# Patient Record
Sex: Female | Born: 1965 | Race: Black or African American | Hispanic: No | Marital: Married | Smoking: Never smoker
Health system: Southern US, Community
[De-identification: ages and names within clinical notes are randomized; demographics above are authoritative.]

## PROBLEM LIST (undated history)

## (undated) DIAGNOSIS — I10 Essential (primary) hypertension: Secondary | ICD-10-CM

## (undated) DIAGNOSIS — C801 Malignant (primary) neoplasm, unspecified: Secondary | ICD-10-CM

## (undated) DIAGNOSIS — I8500 Esophageal varices without bleeding: Secondary | ICD-10-CM

## (undated) DIAGNOSIS — IMO0002 Reserved for concepts with insufficient information to code with codable children: Secondary | ICD-10-CM

## (undated) DIAGNOSIS — Z809 Family history of malignant neoplasm, unspecified: Secondary | ICD-10-CM

## (undated) DIAGNOSIS — I2699 Other pulmonary embolism without acute cor pulmonale: Secondary | ICD-10-CM

## (undated) DIAGNOSIS — K219 Gastro-esophageal reflux disease without esophagitis: Secondary | ICD-10-CM

## (undated) DIAGNOSIS — D649 Anemia, unspecified: Secondary | ICD-10-CM

## (undated) DIAGNOSIS — D219 Benign neoplasm of connective and other soft tissue, unspecified: Secondary | ICD-10-CM

## (undated) DIAGNOSIS — A630 Anogenital (venereal) warts: Secondary | ICD-10-CM

## (undated) HISTORY — DX: Anemia, unspecified: D64.9

## (undated) HISTORY — PX: OTHER SURGICAL HISTORY: SHX169

## (undated) HISTORY — DX: Anogenital (venereal) warts: A63.0

## (undated) HISTORY — DX: Benign neoplasm of connective and other soft tissue, unspecified: D21.9

## (undated) HISTORY — DX: Reserved for concepts with insufficient information to code with codable children: IMO0002

## (undated) HISTORY — DX: Family history of malignant neoplasm, unspecified: Z80.9

---

## 1996-05-17 HISTORY — PX: LEFT OOPHORECTOMY: SHX1961

## 2001-05-17 HISTORY — PX: MYOMECTOMY: SHX85

## 2004-05-17 DIAGNOSIS — D649 Anemia, unspecified: Secondary | ICD-10-CM

## 2004-05-17 HISTORY — DX: Anemia, unspecified: D64.9

## 2005-05-06 ENCOUNTER — Other Ambulatory Visit: Admission: RE | Admit: 2005-05-06 | Discharge: 2005-05-06 | Payer: Self-pay | Admitting: Obstetrics and Gynecology

## 2005-11-19 ENCOUNTER — Inpatient Hospital Stay (HOSPITAL_COMMUNITY): Admission: EM | Admit: 2005-11-19 | Discharge: 2005-11-19 | Payer: Self-pay | Admitting: Emergency Medicine

## 2005-11-19 ENCOUNTER — Encounter (INDEPENDENT_AMBULATORY_CARE_PROVIDER_SITE_OTHER): Payer: Self-pay | Admitting: *Deleted

## 2005-11-24 ENCOUNTER — Emergency Department (HOSPITAL_COMMUNITY): Admission: EM | Admit: 2005-11-24 | Discharge: 2005-11-24 | Payer: Self-pay | Admitting: Emergency Medicine

## 2006-05-17 DIAGNOSIS — I2699 Other pulmonary embolism without acute cor pulmonale: Secondary | ICD-10-CM

## 2006-05-17 HISTORY — DX: Other pulmonary embolism without acute cor pulmonale: I26.99

## 2006-05-17 HISTORY — PX: MYOMECTOMY: SHX85

## 2006-07-26 ENCOUNTER — Encounter: Admission: RE | Admit: 2006-07-26 | Discharge: 2006-07-26 | Payer: Self-pay | Admitting: Internal Medicine

## 2006-10-07 ENCOUNTER — Ambulatory Visit: Payer: Self-pay | Admitting: Oncology

## 2008-12-12 ENCOUNTER — Ambulatory Visit: Payer: Self-pay | Admitting: Internal Medicine

## 2008-12-12 ENCOUNTER — Ambulatory Visit: Payer: Self-pay | Admitting: Radiology

## 2008-12-12 ENCOUNTER — Inpatient Hospital Stay (HOSPITAL_COMMUNITY): Admission: EM | Admit: 2008-12-12 | Discharge: 2008-12-14 | Payer: Self-pay | Admitting: Internal Medicine

## 2008-12-12 ENCOUNTER — Encounter: Payer: Self-pay | Admitting: Emergency Medicine

## 2008-12-13 ENCOUNTER — Encounter (INDEPENDENT_AMBULATORY_CARE_PROVIDER_SITE_OTHER): Payer: Self-pay | Admitting: Internal Medicine

## 2008-12-14 ENCOUNTER — Inpatient Hospital Stay (HOSPITAL_COMMUNITY): Admission: EM | Admit: 2008-12-14 | Discharge: 2008-12-16 | Payer: Self-pay | Admitting: Emergency Medicine

## 2008-12-16 ENCOUNTER — Ambulatory Visit: Payer: Self-pay | Admitting: Vascular Surgery

## 2008-12-16 ENCOUNTER — Encounter (INDEPENDENT_AMBULATORY_CARE_PROVIDER_SITE_OTHER): Payer: Self-pay | Admitting: Internal Medicine

## 2008-12-25 ENCOUNTER — Ambulatory Visit: Payer: Self-pay

## 2009-01-02 ENCOUNTER — Ambulatory Visit (HOSPITAL_BASED_OUTPATIENT_CLINIC_OR_DEPARTMENT_OTHER): Admission: RE | Admit: 2009-01-02 | Discharge: 2009-01-02 | Payer: Self-pay | Admitting: Internal Medicine

## 2009-01-11 ENCOUNTER — Ambulatory Visit: Payer: Self-pay | Admitting: Internal Medicine

## 2009-01-30 DIAGNOSIS — L708 Other acne: Secondary | ICD-10-CM | POA: Insufficient documentation

## 2009-01-30 DIAGNOSIS — G4710 Hypersomnia, unspecified: Secondary | ICD-10-CM | POA: Insufficient documentation

## 2009-01-30 DIAGNOSIS — Z8679 Personal history of other diseases of the circulatory system: Secondary | ICD-10-CM | POA: Insufficient documentation

## 2009-01-30 DIAGNOSIS — I10 Essential (primary) hypertension: Secondary | ICD-10-CM | POA: Insufficient documentation

## 2009-01-31 ENCOUNTER — Ambulatory Visit: Payer: Self-pay | Admitting: Cardiology

## 2010-06-22 ENCOUNTER — Emergency Department (HOSPITAL_COMMUNITY)
Admission: EM | Admit: 2010-06-22 | Discharge: 2010-06-22 | Disposition: A | Discharge status: Home / Self Care | Payer: BC Managed Care – PPO | Attending: Emergency Medicine | Admitting: Emergency Medicine

## 2010-06-22 ENCOUNTER — Emergency Department (HOSPITAL_COMMUNITY): Payer: BC Managed Care – PPO

## 2010-06-22 DIAGNOSIS — I10 Essential (primary) hypertension: Secondary | ICD-10-CM | POA: Insufficient documentation

## 2010-06-22 DIAGNOSIS — R0602 Shortness of breath: Secondary | ICD-10-CM | POA: Insufficient documentation

## 2010-06-22 DIAGNOSIS — R079 Chest pain, unspecified: Secondary | ICD-10-CM | POA: Insufficient documentation

## 2010-06-22 LAB — URINALYSIS, ROUTINE W REFLEX MICROSCOPIC
Hgb urine dipstick: NEGATIVE
Specific Gravity, Urine: 1.021 (ref 1.005–1.030)
Urobilinogen, UA: 0.2 mg/dL (ref 0.0–1.0)

## 2010-06-22 LAB — BASIC METABOLIC PANEL
CO2: 25 mEq/L (ref 19–32)
Calcium: 9.2 mg/dL (ref 8.4–10.5)
Creatinine, Ser: 0.67 mg/dL (ref 0.4–1.2)
GFR calc non Af Amer: >60 mL/min (ref >60)

## 2010-06-22 MED ORDER — IOHEXOL 300 MG/ML  SOLN
100.0000 mL | Freq: Once | INTRAMUSCULAR | Status: AC | PRN
  Administered 2010-06-22: 100 mL via INTRAVENOUS

## 2010-08-22 LAB — BASIC METABOLIC PANEL
Calcium: 8.8 mg/dL (ref 8.4–10.5)
Chloride: 104 mEq/L (ref 96–112)
Creatinine, Ser: 0.58 mg/dL (ref 0.4–1.2)
Glucose, Bld: 92 mg/dL (ref 70–99)
Potassium: 3.3 mEq/L — ABNORMAL LOW (ref 3.5–5.1)

## 2010-08-22 LAB — CBC
HCT: 31.1 % — ABNORMAL LOW (ref 36.0–46.0)
Hemoglobin: 10.3 g/dL — ABNORMAL LOW (ref 12.0–15.0)
MCHC: 33.1 g/dL (ref 30.0–36.0)
MCV: 81.9 fL (ref 78.0–100.0)
Platelets: 237 10*3/uL (ref 150–400)
RBC: 3.8 MIL/uL — ABNORMAL LOW (ref 3.87–5.11)
RDW: 14.7 % (ref 11.5–15.5)

## 2010-08-22 LAB — CATECHOLAMINES, FRACTIONATED, URINE, 24 HOUR: Epinephrine 24 Hr Urine: 8 ug/24hr (ref <20)

## 2010-08-22 LAB — 5 HIAA, QUANTITATIVE, URINE, 24 HOUR: Volume, Urine-5HIAA: 1075 mL/24 h

## 2010-08-22 LAB — COMPREHENSIVE METABOLIC PANEL
BUN: 7 mg/dL (ref 6–23)
Calcium: 8.7 mg/dL (ref 8.4–10.5)
Chloride: 101 mEq/L (ref 96–112)
Sodium: 133 mEq/L — ABNORMAL LOW (ref 135–145)
Total Bilirubin: 0.5 mg/dL (ref 0.3–1.2)

## 2010-08-22 LAB — CREATININE CLEARANCE, URINE, 24 HOUR
Collection Interval-CRCL: 24 hours
Creatinine Clearance: 229 mL/min — ABNORMAL HIGH (ref 75–115)

## 2010-08-23 LAB — URINALYSIS, ROUTINE W REFLEX MICROSCOPIC
Bilirubin Urine: NEGATIVE
Bilirubin Urine: NEGATIVE
Glucose, UA: 100 mg/dL — AB
Glucose, UA: NEGATIVE mg/dL
Glucose, UA: NEGATIVE mg/dL
Hgb urine dipstick: NEGATIVE
Hgb urine dipstick: NEGATIVE
Ketones, ur: NEGATIVE mg/dL
Ketones, ur: NEGATIVE mg/dL
Protein, ur: NEGATIVE mg/dL
Specific Gravity, Urine: 1.016 (ref 1.005–1.030)
Urobilinogen, UA: 0.2 mg/dL (ref 0.0–1.0)
Urobilinogen, UA: 1 mg/dL (ref 0.0–1.0)
pH: 7 (ref 5.0–8.0)

## 2010-08-23 LAB — BASIC METABOLIC PANEL
BUN: 10 mg/dL (ref 6–23)
CO2: 28 mEq/L (ref 19–32)
Calcium: 9.5 mg/dL (ref 8.4–10.5)
Creatinine, Ser: 0.58 mg/dL (ref 0.4–1.2)
Creatinine, Ser: 0.6 mg/dL (ref 0.4–1.2)
GFR calc Af Amer: >60 mL/min (ref >60)
GFR calc Af Amer: >60 mL/min (ref >60)
GFR calc non Af Amer: >60 mL/min (ref >60)
GFR calc non Af Amer: >60 mL/min (ref >60)
Sodium: 138 mEq/L (ref 135–145)
Sodium: 141 mEq/L (ref 135–145)

## 2010-08-23 LAB — CARDIAC PANEL(CRET KIN+CKTOT+MB+TROPI)
CK, MB: 2.1 ng/mL (ref 0.3–4.0)
Total CK: 127 U/L (ref 7–177)
Total CK: 139 U/L (ref 7–177)
Troponin I: 0.01 ng/mL (ref 0.00–0.06)

## 2010-08-23 LAB — CBC
HCT: 33.3 % — ABNORMAL LOW (ref 36.0–46.0)
Hemoglobin: 10.6 g/dL — ABNORMAL LOW (ref 12.0–15.0)
MCHC: 31.7 g/dL (ref 30.0–36.0)
MCV: 81.8 fL (ref 78.0–100.0)
Platelets: 244 10*3/uL (ref 150–400)
Platelets: 259 10*3/uL (ref 150–400)
RBC: 4.1 MIL/uL (ref 3.87–5.11)
RDW: 14.5 % (ref 11.5–15.5)
RDW: 14.8 % (ref 11.5–15.5)
WBC: 4.5 10*3/uL (ref 4.0–10.5)
WBC: 4.6 10*3/uL (ref 4.0–10.5)

## 2010-08-23 LAB — DIFFERENTIAL
Basophils Absolute: 0 10*3/uL (ref 0.0–0.1)
Eosinophils Absolute: 0 10*3/uL (ref 0.0–0.7)
Eosinophils Absolute: 0.1 10*3/uL (ref 0.0–0.7)
Lymphs Abs: 1.6 10*3/uL (ref 0.7–4.0)
Lymphs Abs: 1.7 10*3/uL (ref 0.7–4.0)
Neutrophils Relative %: 51 % (ref 43–77)
Neutrophils Relative %: 54 % (ref 43–77)

## 2010-08-23 LAB — GLUCOSE, CAPILLARY
Glucose-Capillary: 117 mg/dL — ABNORMAL HIGH (ref 70–99)
Glucose-Capillary: 121 mg/dL — ABNORMAL HIGH (ref 70–99)
Glucose-Capillary: 85 mg/dL (ref 70–99)
Glucose-Capillary: 90 mg/dL (ref 70–99)
Glucose-Capillary: 94 mg/dL (ref 70–99)

## 2010-08-23 LAB — POCT I-STAT, CHEM 8
Creatinine, Ser: 0.7 mg/dL (ref 0.4–1.2)
HCT: 34 % — ABNORMAL LOW (ref 36.0–46.0)
Hemoglobin: 11.6 g/dL — ABNORMAL LOW (ref 12.0–15.0)
Potassium: 3.5 mEq/L (ref 3.5–5.1)
Sodium: 137 mEq/L (ref 135–145)
TCO2: 26 mmol/L (ref 0–100)

## 2010-08-23 LAB — POCT CARDIAC MARKERS
CKMB, poc: 1.6 ng/mL (ref 1.0–8.0)
Myoglobin, poc: 45.5 ng/mL (ref 12–200)
Troponin i, poc: 0.39 ng/mL (ref 0.00–0.09)

## 2010-08-23 LAB — POCT TOXICOLOGY PANEL

## 2010-08-23 LAB — PREGNANCY, URINE: Preg Test, Ur: NEGATIVE

## 2010-08-23 LAB — HOMOCYSTEINE: Homocysteine: 6.6 umol/L (ref 4.0–15.4)

## 2010-08-23 LAB — LIPID PANEL
HDL: 29 mg/dL — ABNORMAL LOW (ref >39)
Total CHOL/HDL Ratio: 5.2 RATIO
Triglycerides: 107 mg/dL (ref <150)
VLDL: 21 mg/dL (ref 0–40)

## 2010-08-23 LAB — URINE MICROSCOPIC-ADD ON

## 2010-08-23 LAB — RAPID URINE DRUG SCREEN, HOSP PERFORMED
Amphetamines: NOT DETECTED
Benzodiazepines: NOT DETECTED
Cocaine: NOT DETECTED

## 2010-08-23 LAB — PROTIME-INR: Prothrombin Time: 16.3 seconds — ABNORMAL HIGH (ref 11.6–15.2)

## 2010-09-29 NOTE — Discharge Summary (Signed)
NAMEOLIVIANNA, HIGLEY NO.:  [id number]   MEDICAL RECORD NO.:  [id number]          PATIENT TYPE:  INP   LOCATION:  3708                         FACILITY:  [facility]   PHYSICIAN:  Isidor Holts, M.D.  DATE OF BIRTH:  [birth year]   DATE OF ADMISSION:  12/14/2008  DATE OF DISCHARGE:  12/16/2008                               DISCHARGE SUMMARY   PRIMARY CARE PHYSICIAN:  Dr. Lonia Blood   PRIMARY CARDIOLOGIST:  Dr. Juanda Chance, [facility]   DISCHARGE DIAGNOSES:  1. Presyncope.  2. Recurrent palpitations; rule out paroxysmal tachyarrhythmia.  3. Hypertension.  4. Hypersomnolence; rule out obstructive sleep apnea syndrome.  5. Recent urinary tract infection.  6. History of Acne.   DISCHARGE MEDICATIONS:  1. Diovan 160 mg p.o. daily (was on Diovan/Hydrochlorothiazide      160/12.5).  2. Accutane 40 mg p.o. q.a.m. and 80 mg p.o. q.p.m.  3. Lopressor 12.5 mg p.o. b.i.d.  4. Aspirin 81 mg p.o. daily.   PROCEDURES:  1. Chest x-ray done December 14, 2008:  This was a limited study by poor      inspiration.  No acute infiltrate or edema.  Bilateral basilar      atelectasis.  2. Head CT scan done December 14, 2008:  This was a normal examination.  3. Bilateral carotid/vertebral artery duplex scans December 16, 2008:      This showed no significant ICA stenosis bilaterally.  4. EEG done December 16, 2008:  Report was still pending at the time of      this dictation.   CONSULTATIONS:  None.  Telephone discussion however, was held with Dr.  Olga Millers, [facility].   ADMISSION HISTORY:  As in history and physical notes of December 14, 2008,  dictated by Dr. Theodosia Paling.  However, in brief, this is a 45-year-  old female, with known history of hypertension, acne, status post  oophorectomy 2008, status post recent hospitalization from December 12, 2008, to December 14, 2008, for a presyncopal episode, as well as urinary  tract infection and hypersomnolence.  Investigative workup  at that time,  including chest CT angiogram and 2-D echocardiogram, was quite  unremarkable.  She was seen in consultation at the time by Dr. Jim Desanctis, [place] [facility].  She was subsequently discharged in  satisfactory condition.  However, she re-presented because of acute  onset of feeling flushed, associated with agitation and dizziness, as  well as palpitations.  She felt a patch of paresthesia behind her neck.  This prompted her to come to the emergency department.  The episode  occurred while the patient was driving.  She was, therefore, admitted  for further evaluation, investigation and management.   CLINICAL COURSE:  1. Presyncope:  For details of presentation, refer to admission      history above.  The patient was placed on telemetric monitoring.      Head CT scan was unremarkable for acute pathology as was chest x-      ray.  She had no episodes of dysrhythmia recorded on telemetry,      during the course of  this hospitalization, and furthermore, had no      recurrence of palpitations.  EEG was carried out on December 16, 2008.      However, at the time of this dictation, report was still pending.      She did have bilateral vertebral artery duplex scans, which were      negative for ICA stenosis.  TSH was normal at 0.763, and      orthostatic blood pressure measurements showed no evidence of      postural hypotension.  She was evaluated by PT/OT during the course      of this hospitalization, and was asymptomatic.  I did have a      telephone discussion with Dr. Olga Millers, [place] [facility],      who has assured me that [facility] office will arrange an      outpatient CardioNet Continuous Rhythm Monitor following discharge,      as well as followup with Dr. Juanda Chance, cardiologist.  He has assured      me that the patient will be contacted by the office to arrange      these appointments.  The patient is agreeable to this plan.   1. Hypersomnolence:   The patient does complain of occasional episodes      of daytime somnolence, raising the specter of possible sleep apnea      syndrome versus seizure disorder.  As noted above, EEG has been      done; results are still pending.  However, the patient is      recommended outpatient sleep study, and arrangements have been put      in place.   1. Hypertension:  The patient remained normotensive throughout the      course of hospitalization, on ARB in pre-admission dosage.  Her      Hydrochlorothiazide has been discontinued, as she appeared mildly      volume depleted during the course of her most recent      hospitalization.   1. Recent UTI:  The patient was treated with Ciprofloxacin for the      UTI, during the course of her last hospitalization.  Repeat      urinalysis done on December 14, 2008, was negative.  She however,      completed her course of Ciprofloxacin on December 16, 2008.   1. History of Acne:  The patient continues on Accutane, in pre-      admission dosage.   DISPOSITION:  The patient was on December 16, 2008, asymptomatic and was  considered clinically stable for discharge.  Because of her history of  fairly recent onset of hypertension, associated with palpitations,  occasional drowsiness, and the fact that she had some orthostasis during  her most recent hospitalization, it was felt appropriate to screen for  pheochromocytoma, as well as carcinoid.  She therefore, underwent a 24-  hour urinary collection for creatinine, metanephrine, catecholamines and  HIAA.  This was completed at 6:30 p.m. on December 16, 2008.  We shall  defer followup of the results of this study, to her primary MD, Dr.  Lonia Blood.  The patient was discharged on December 16, 2008.   DIET:  Heart-healthy.   ACTIVITY:  As tolerated.  Recommended to increase activity slowly.   FOLLOW-UP INSTRUCTIONS:  The patient is to follow up with her primary  MD, Dr. Lonia Blood in the coming week.  She has been  instructed to call  for an appointment.  Outpatient  sleep study has been arranged.  The  patient has been supplied with appropriate information.  Hopefully, this  will be done within the next 1-2 weeks.  [facility] office,  i.e., Dr. Regino Schultze office, will contact the patient to schedule follow-  up appointment with Dr. Juanda Chance, and also to arrange CardioNet  monitoring.   SPECIAL INSTRUCTIONS:  The patient has been recommended to avoid  driving, operating complicated or moving machinery, work on heights,  etc., until her studies are completed.  All this has been communicated  to the patient.  She has verbalized understanding.      Isidor Holts, M.D.  Electronically Signed     CO/MEDQ  D:  12/16/2008  T:  12/16/2008  Job:  [id number]   cc:   Lonia Blood, M.D.  Bruce Elvera Lennox Juanda Chance, MD, [facility]

## 2010-09-29 NOTE — Consult Note (Signed)
NAMETERESEA, DONLEY NO.:  [id number]   MEDICAL RECORD NO.:  [id number]          PATIENT TYPE:  INP   LOCATION:  2909                         FACILITY:  [facility]   PHYSICIAN:  Wendi Snipes, MD DATE OF BIRTH:  1966/01/06   DATE OF CONSULTATION:  12/13/2008  DATE OF DISCHARGE:                                 CONSULTATION   CARDIOLOGIST:  None.   PRIMARY CARE PHYSICIAN:  Lonia Blood, M.D.   CHIEF COMPLAINT:  Lightheadedness.   HISTORY OF PRESENT ILLNESS:  This is a 45 year old African American  female with newly diagnosed hypertension, recently started on blood  pressure medications.  Reported the acute onset of dizziness while  driving today.  She states that her symptoms began as tingling starting  at her feet and then slowly progressing up her legs to her upper  extremities, associated with shortness of breath and eventually feeling  sleepy while driving.  She states that she has never passed out before,  but she believes this to be the feeling just before passing out.  She  did not lose consciousness during this time.  The symptoms lasted 5  minutes approximately and resolved spontaneously.  During these  episodes, she denies chest pain, palpitations, syncope.  Additionally  she is relatively active and denies exertional angina or previous  paroxysmal nocturnal dyspnea, orthopnea, or increased lower extremity  edema.   PAST MEDICAL HISTORY:  1. Urine fibroids with iron deficiency.  2. Cystic acne.  3. Hypertension.  4. History of PE.   ALLERGIES:  MINOCIN.   MEDICATIONS ON ADMISSION:  1. Diovan/HCT 160/12.5.  2. Accutane   SOCIAL HISTORY:  She lives in [place] with her husband.  She  currently works at [employer].  She does not smoke.   FAMILY HISTORY:  Her mother and father have diabetes.  Additionally, her  father recently had a stroke.  There is no early-onset coronary artery  disease.   REVIEW OF SYSTEMS:  All 14 systems were reviewed and  were negative  except as mentioned in detail in the HPI.   PHYSICAL EXAMINATION:  Her blood pressure is 145/91, respiratory rate  16, pulses 64.  She is satting at 100% on 2 liters nasal cannula.  GENERAL:  She is a 45 year old African American female appearing stated  age in no acute distress.  HEENT:  Moist mucous membranes.  Pupils equal, round, react to light  accommodation.  Anicteric sclera.  NECK:  No jugular venous distention.  No thyromegaly.  CARDIOVASCULAR:  Regular rate and rhythm.  No murmurs, rubs or gallops.  LUNGS:  Clear to auscultation bilaterally.  ABDOMEN:  Nontender, nondistended.  Positive bowel sounds.  No masses.  EXTREMITIES:  Trace lower extremities acute ischemia trace lower  extremity edema to the ankle ankles.  NEUROLOGIC:  Alert and x3.  Cranial nerves II-XII grossly intact.  No  focal neurologic deficits.  SKIN:  Warm, dry and intact.  No rashes.  Psych mood and affect are  appropriate.   RADIOLOGY:  CTA showed dependent basal atelectasis and no evidence of  pulmonary embolism.   EKG showed  sinus tachycardia at a rate of 111 beats per minute with  nonspecific and diffuse T-wave abnormalities.   Laboratory review her white blood cell count of 4.5.  Her hematocrit is  33.3.  Her platelet count is 259.  Her potassium is 3.4, creatinine is  0.6.  Her anion gap is normal.  Her point-of-care cardiac markers are  0.39 and 0.2, respectively with a CK-MB 0.25 and 0.20, respectively.   ASSESSMENT/PLAN:  This is a 45 year old African American female with a  history of hypertension here with presyncope and found to have newly  diagnosed diabetes and elevated point-of-care cardiac biomarkers.  1. Presyncope:  This is very likely vasovagal in etiology, though her      symptoms are somewhat nonspecific.  She likely had her blood sugar      elevated for a few weeks, plus she recently started diuretic and      this may have resulted in relative dehydration, which  can      contribute to these symptoms.  Otherwise, her elevated point-of-      care markers were nondiagnostic and should be repeated here.  She      is currently on Accutane, as well, and this medication has similar      side effects to the ones that she reports, though she has been      taking it for 2 years without incident, though she is currently on      this medication without birth control.  2. Hypertension:  Will recommend low-salt, low-fat diet and continue      her current blood pressure medications.  3. New-onset diabetes type 2.  Check a hemoglobin A1c and urine      microalbumin.  Consider beginning oral therapy during this      hospitalization      Wendi Snipes, MD  Electronically Signed     BHH/MEDQ  D:  12/13/2008  T:  12/13/2008  Job:  [id number]

## 2010-09-29 NOTE — Procedures (Signed)
This is a 45 year old female who was admitted due to presyncope episode,  palpitation, paresthesia behind her neck, and recently diagnosed with  hypertension.   CURRENT MEDICATIONS:  Benicar, aspirin, Lopressor, Cipro, Zofran,  Ventolin, Tylenol, Diovan, hydrochlorothiazide, metoprolol.   A 16-channel EEG was performed based on standard international 10-20  system.  Total recording time is 22 minutes, one channel dedicated to  EKG, which has demonstrated normal sinus rhythm of 66 beats per minute.   Upon awakening, posterior background activity was well developed, 9-10  Hz, alpha range, reactive to eye opening and closure.  There was no  evidence of epileptiform discharge.   Photic stimulation, hyperventilation was performed.  No abnormality  elicited, the patient was not able to achieve sleep during recording.   In conclusion, this is normal awake EEG.  There is no evidence of  epileptiform discharge.      Levert Feinstein, MD  Electronically Signed     EA:[id number]  D:  12/16/2008 15:24:50  T:  12/17/2008 04:54:07  Job #:  [id number]

## 2010-09-29 NOTE — H&P (Signed)
[name], [name] NO.:  [id number]   MEDICAL RECORD NO.:  [id number]          PATIENT TYPE:  INP   LOCATION:  2909                         FACILITY:  [facility]   PHYSICIAN:  Herbie Saxon, MDDATE OF BIRTH:  July 15, 1965   DATE OF ADMISSION:  12/12/2008  DATE OF DISCHARGE:                              HISTORY & PHYSICAL   PRIMARY CARE PHYSICIAN:  Lonia Blood, MD   She is a full code.   PRESENTING COMPLAINTS:  Dizziness, 1 day.   HISTORY OF PRESENTING COMPLAINT:  This 45 year old African American  female was recently diagnosed with hypertension 6 days ago and was  started on Diovan HCT, was quite well until 6 p.m. today.  While driving  back from work, she noticed sudden-onset dizziness, lightheadedness,  shortness of breath, and palpitations necessitating her to pull over and  call the [facility].  The patient was initially taken to the [facility] where cardiac enzyme was noticed to be elevated at 0.39.  The  patient denied any diaphoresis or chest pain.  No loss of consciousness.  No seizure activity.  The patient has not had any previous episodes of  chest pain.  The patient was diagnosed with pulmonary embolism in July  2007.  However, she has been off anticoagulants since January 2008.  As  I listed, she was recently started on Diovan HCT by her primary care  physician 6 days ago.  The patient was also noticed to have elevated  [facility], but there is no prior history of  diabetes.  The patient is still complaining of malaise and drowsiness.  There has been no abdominal pain, hematochezia, hematuria, or  hematemesis.  No symptoms referable to the genitourinary or CNS systems.  No new skin rash or joint swelling.   FAMILY HISTORY:  Father had diabetes, heart disease in grandparents, and  mother, diabetes and hypertension.   PAST SURGERIES:  Mammectomy in 2003 and oophorectomy in 2008.   SOCIAL HISTORY:  She is  married.  There is no history of alcohol,  tobacco, or illicit drug abuse.   ALLERGIES:  Antibiotic called MINOCIN.   HOME MEDICATIONS:  1. Diovan HCT 160/12.5 mg daily.  2. Accutane 40 mg a.m. and 60 mg at bedtime.   PAST MEDICAL HISTORY:  As listed, hypertension and PE.   REVIEW OF SYMPTOMS:  A 14-point review of system was done with the  patient.  Pertinent positive as in the history of presenting complaints.   PHYSICAL EXAMINATION:  GENERAL:  She is a young lady, not in acute  distress.  VITAL SIGNS:  Temperature 98, pulse is 101, respiratory rate 16, and  blood pressure 166/80.  HEENT:  Pupils are equal and reactive to light and accommodation.  Extraocular muscles are intact.  Mucous membranes are moist.  Oropharynx  and nasopharynx are clear.  NECK:  Supple.  There is no elevated JVD.  No thyromegaly.  No carotid  bruit and no lymphadenopathy.  CHEST:  Clinically clear.  No rales or rhonchi.  HEART:  Heart sounds 1 and  2.  Regular rate and rhythm.  No murmurs,  rubs, gallops, or clicks.  ABDOMEN:  Soft and nontender.  Bowel sounds present.  No organomegaly.  No ventral or inguinal hernia.  NEUROLOGIC:  She is alert and oriented to time, place, and person.  No  focal neurological deficits.  EXTREMITIES:  Peripheral pulses are present.  No pedal edema.  No  clubbing.  No cyanosis.  No skin rash.  Normal range of movement of all  joints.   LABORATORY DATA:  WBCs 4.5, hematocrit 33, and platelet count is 259.  Chemistry sodium is 141, potassium 3.4, glucose is 222, chloride 102,  bicarbonate 30, BUN 10, and creatinine is 0.6.  Troponin is 0.39.  CK-MB  2.5.  Urinalysis shows small leukocyte esterase and wbc 3-6.  CT  angiogram was negative for pulmonary embolism.  EKG shows sinus  tachycardia at 111 per minute with left axis deviation.   ASSESSMENT:  Acute myocardial infarction, non-ST-elevated myocardial  infarction; diabetes type 2, new onset; moderate hypertension;  pyuria;  anemia of chronic disease, mild; and mild hypokalemia.  The patient is  to be admitted to CCU.  We will get a stat cardiology consult with  [place].  Keep her n.p.o. from midnight for possible cardiac stress test  in the morning.  The patient is to be started on morphine 2 mg IV q.6 h.  p.r.n., nitro paste 0.5 inch q.6 h., metoprolol 12.5 mg daily, and  Lopressor 2.5 mg IV q.6 h. p.r.n. for blood pressure greater than  160/110 or heart rate greater than 119 per minute.  She is to be on  continuous pulse ox and oxygen supplemented with 2-5 liters to keep sats  greater than 90.  Accu-Cheks a.c. and at bedtime with NovoLog sliding  scale under moderate scale coverage.  DuoNeb 1 unit dose q.6 h. p.r.n.  We will start her on full-dose Lovenox 1 mg/kg subcu q.12 h. and  Protonix 40 mg IV daily.  We will check carotid Doppler and 2-D  echocardiogram in the morning.  We will get serial cardiac enzymes and  EKG q.8 h. x3.  We will check thyroid function tests, fasting lipid,  homocysteine, calcium, magnesium, and phosphate levels.  IV fluid half  normal saline at 25 mL an hour with addition of 20 mEq of KCl on the  first liter.  Diet to be heart healthy, low cholesterol, 1800-calorie  ADA.  Strict bedrest for the first 24 hours.  The patient's illness,  medications, test, and treatment plan explained to her and she  verbalizes understanding.  Also note that the patient is to be on  aspirin 325 mg daily.  Encounter time 1 hour.      Herbie Saxon, MD  Electronically Signed     Herbie Saxon, MD  Electronically Signed    MIO/MEDQ  D:  12/12/2008  T:  12/13/2008  Job:  [id number]

## 2010-09-29 NOTE — Discharge Summary (Signed)
[name], BATTIN NO.:  [id number]   MEDICAL RECORD NO.:  [id number]          PATIENT TYPE:  INP   LOCATION:  4731                         FACILITY:  [facility]   PHYSICIAN:  Ruthy Dick, MD    DATE OF BIRTH:  Sep 22, 1965   DATE OF ADMISSION:  12/12/2008  DATE OF DISCHARGE:  12/14/2008                               DISCHARGE SUMMARY   REASON FOR ADMISSION:  Presyncopal episode.   FINAL DISCHARGE DIAGNOSES:  1. Presyncope.  2. Mild elevation of troponin which on subsequent test was normal, no      evidence of myocardial infarction.  3. Accelerated hypertension.  4. Orthostasis, resolved.  5. Sleepiness, rule out obstructive sleep apnea.  6. Chronic anemia.  7. Mild urinary tract infection.   PROCEDURES DONE DURING THIS ADMISSION:  1. CT angiogram of the chest, which was read as negative for pulmonary      embolism with changes and exaggerated kyphosis of the thoracic      spine.  2. A 2D echocardiogram, which showed a 55-60% ejection fraction with      no regional wall abnormalities.   CONSULTS DURING THIS ADMISSION:  Cardiology consult.   BRIEF HISTORY OF PRESENTING ILLNESS AND HOSPITAL COURSE:  This is a 45-  year-old [place] American lady who was recently diagnosed with  hypertension and who was admitted to the hospital because of sudden  onset of dizziness, lightheadedness, palpitation, and some shortness of  breath.  The patient was taken to [facility] and over  there troponin was noted to be at 0.20.  Because of this, the patient  was admitted with a tentative diagnosis of possible non-ST elevation  myocardial infarction.  However, the patient did not seem to have  myocardial infarction since subsequent enzymes turned out to be normal.  Because of this, Cardiology did not think the patient warranted any  further workup.  A 2D echo was normal.  The patient was still  complaining of feeling of sleepiness.  She does admit that she  snores  when she sleeps begin the possibility of obstructive sleep apnea being a  component here.  We believe she is stable enough to continue all workup  in the outpatient setting with her primary care physician, Dr. Lonia Blood.  We think she will need a sleep study in the outpatient setting  and we have also advised her to refrain from driving motor vehicle or  operating heavy machinery for the next week or two until she has been  seen by Dr. Lonia Blood and sleep study done and read.  Possibly, I  suspect that the issues could have been from elevated blood pressure,  but she seems to continue to have the same feeling of sleepiness every  now and then even when her blood pressure is under better control.   DISCHARGE MEDICATIONS:  She will continue on Diovan HCT 160/12.5 mg  daily, Accutane 40 mg q.a.m. and 80 mg q.p.m., metoprolol 12.5 mg p.o.  b.i.d., Cipro 500 mg p.o. b.i.d. for 2 days.   The patient is to follow with Dr.  Lonia Blood in about 3-5 days so that  issues with sleepiness can be addressed and the patient's driving issues  can also be addressed.  She is also to [facility] with the [facility]  and scheduled for a polysomnogram.  Since today is Saturday, we would  not be able to make this appointment by ourselves, but she will be  provided with the phone number to the sleep center, which she can call  and try and make an appointment as soon as possible.  Since driving  privileges are involved, we advised that she makes this appointment as  soon as possible so that this issues can be put to rest.  Today, she has  no other complaints.  No chest pain, no shortness of breath, no  abdominal pain, no nausea, no vomiting, no diarrhea, no constipation, no  dysuria, no frequency, no urgency, no syncope, no presyncope episodes.  She has occasionally sleepy feeling.   PHYSICAL EXAMINATION:  VITAL SIGNS:  Vitals today are temperature 98.2,  pulse 57, respiration 20, blood pressure  154/90, and saturating 100% on  room air.  CHEST:  Clear to auscultation bilaterally.  No rhonchi, no rales, no  wheezing.  ABDOMEN:  Soft and nontender.  EXTREMITIES:  No clubbing, no cyanosis, no edema.  CARDIOVASCULAR SYSTEM:  First and second heart sounds only.  CENTRAL NERVOUS SYSTEM:  Nonfocal.   Total time used for discharge 40 minutes.      Ruthy Dick, MD  Electronically Signed     GU/MEDQ  D:  12/14/2008  T:  12/15/2008  Job:  [id number]   cc:   Lonia Blood, M.D.

## 2010-09-29 NOTE — H&P (Signed)
NAMELEONORA, GORES NO.:  [id number]   MEDICAL RECORD NO.:  [id number]          PATIENT TYPE:  INP   LOCATION:  1827                         FACILITY:  [facility]   PHYSICIAN:  Theodosia Paling, MD    DATE OF BIRTH:  [birth year]   DATE OF ADMISSION:  12/14/2008  DATE OF DISCHARGE:                              HISTORY & PHYSICAL   PRIMARY CARE PHYSICIAN:  Lonia Blood, M.D.   CHIEF COMPLAINT:  Presyncopal event, palpitation and paresthesia.   HISTORY OF PRESENT ILLNESS:  Ms. Andreas Newport is a pleasant 42-year African  American lady who was recently diagnosed with hypertension and was  admitted from December 12, 2008 until today under [facility],  with the sudden onset of dizziness, shortness of breath and  palpitations.  During that admission she was found to have a troponin  leak up to 0.3, which further decreased.  Cardiology consult was  performed, and they felt that most likely her presyncopal event was  panic attack in  etiology.  The patient underwent an echocardiogram,  which was essentially normal with normal systolic function, no wall  motion defect, and EF of 55-60%; no evidence of mitral valve prolapse.   The patient apparently was discharged home today.  According to her she  was having some slight episodes of transient presyncopal events even  before discharge.  However, on her way back home at around 4:30 p.m. she  had an acute, sudden onset of feeling flushed and agitation.  She again  felt dizzy.  She felt there was a patch of paresthesia behind her neck.  This prompted her to come back to the emergency room.   In the ER the patient was evaluated, where most of the blood work was  again normal.  Education officer, environmental was reconsulted for further  evaluation and management.   REVIEW OF SYSTEMS:  As per hospital course, otherwise negative.   PAST MEDICAL HISTORY:  1. Recently diagnosed with hypertension.  2. Prior history of acne   PAST  SURGICAL HISTORY:  Oophorectomy in 2008.   SOCIAL HISTORY:  The patient is married.  There is no history of  alcohol, tobacco or IV drug abuse.   ALLERGIES:  NKA   DISCHARGE MEDICATIONS:  The patient is not carrying her discharge sheet  with her, however, according to her she has been discharged on new  medications that were added, which are:  1. Ciprofloxacin, unknown dose.  2. Metoprolol, unknown dose.   HOME MEDICATIONS PRIOR TO THIS ADMISSION:  1. Diovan HCTZ 160/12.5 mg p.o. daily.  2. Accutane 40 mg in the morning and 60 mg at bedtime.   EXAMINATION:  VITALS:  Temperature 98.2, heart rate 72 sinus,  respiratory rate 20, blood pressure 126/72, oxygen saturation 100% on  room air.  GENERAL:  No acute cardiorespiratory distress.  HEENT:  No ear or nose discharge.  EOM intact.  Normal mucous membrane.  LUNGS:  Normal breath sounds, no rhonchi or crepitations.  CARDIOVASCULAR:  S1 and S2 normal.  No murmur or gallop heard.  GI:  Soft, nontender.  No organomegaly.  EXTREMITIES:  No pedal edema.  PSYCHIATRIC:  Oriented x3.  CNS:  Speech intact.  Follows commands.   LABORATORY DATA:  WBC 4.6, hemoglobin 10.5, hematocrit 32.2, platelet  count 244.  Sodium 137, potassium 3.5, chloride 101, bicarbonate does  not develop now, but given the patient has a normal BUN 8, creatinine 0.  7.  Cardiac enzyme first set negative.   ASSESSMENT AND PLAN:  1. Presyncopal event:  According to the history of the patient, this      appears to be more like a panic attack.  Her TSH was normal.      Another unusual cause could be partial complex seizure.  She may      benefit from a 24-hour  telemetry, EEG monitoring.  I am going to      request an EEG in the morning; however, most likely it is going to      be negative.  Given its episodic nature, a Psychiatry consult in      the morning can be obtained and SSI can be started.  If Dr.      Jeanie Sewer feels that it is a panic attack, with acute onset  and      episodic in nature, I think that stopping medicine.  The patient      could have these episodes from arrhythmia; however, since she was      from 29-38 she was in the telemetry evaluation.  It was negative      for arrhythmia.  She may benefit from an event recorder or Holter      monitor, to make sure the arrhythmia is not contributing to her      episodic palpitations and presyncopal events.  2. Hypertension:  I would give the patient low-dose metoprolol, to      correct any SVT episodes that she may be experiencing and      contributing to her current symptoms.  3. Prophylaxis:  DVT and GI requested.  4. CODE STATUS:  The patient is Full Code.      Theodosia Paling, MD  Electronically Signed     NP/MEDQ  D:  12/14/2008  T:  12/14/2008  Job:  [id number]   cc:   Lonia Blood, M.D.

## 2010-09-29 NOTE — Procedures (Signed)
NAMEDESTINY, [name] NO.:  [id number]   MEDICAL RECORD NO.:  [id number]          PATIENT TYPE:  OUT   LOCATION:  SLEEP CENTER                 FACILITY:  [facility]   PHYSICIAN:  Clinton D. Maple Hudson, MD, FCCP, FACPDATE OF BIRTH:  10-Jul-1967   DATE OF STUDY:  01/02/2009                            NOCTURNAL POLYSOMNOGRAM   REFERRING PHYSICIAN:   REFERRING PHYSICIAN:  Lonia Blood, MD   INDICATIONS FOR STUDY:  Hypersomnia with sleep apnea.   EPWORTH SLEEPINESS SCORE:  Epworth sleepiness score 15/24, BMI 32.1,  weight 193 pounds, height 65 inches, and neck 17 inches.   HOME MEDICATIONS:  Charted and reviewed.   SLEEP ARCHITECTURE:  Total sleep time 383 minutes with sleep efficiency  92.7%.  Stage I was 11.5%, stage II 77.5%, stage III absent, REM 11% of  total sleep time.  Sleep latency 13.5 minutes, REM latency 131 minutes,  awake after sleep onset 10.5 minutes, arousal index 19.  No bedtime  medication was reported.   RESPIRATORY DATA:  Apnea/hypopnea index (AHI) 2.2 per hour.  A total of  14 events were scored including 5 obstructive apneas and 9 hypopneas.  Most events were recorded while in supine.  REM AHI 14.3.  An additional  21 respiratory effort related arousals not meeting duration and  intensity criteria to be scored as apneas or hypopneas, did contribute  to a cumulative respiratory disturbance index (RDI) of 5.5 per hour.  There were insufficient events to permit CPAP titration by split  protocol on the study night.   OXYGEN DATA:  Moderately loud snoring with oxygen desaturation to a  nadir of 90%.  Mean oxygen saturation through the study 97.8% on room  air.   CARDIAC DATA:  Normal sinus rhythm.   MOVEMENT/PARASOMNIA:  No significant movement disturbance.  No bathroom  trips.   IMPRESSION/RECOMMENDATIONS:  Occasional respiratory events with sleep  disturbance, within normal limits, AHI 2.2 per hour, RDI 5.5 per hour  (normal range 0-5 per hour).   Most events were associated with supine  sleep position, suggesting she may benefit by effort to sleep off flat  or back.  Snoring was moderate with oxygen desaturation to a nadir of  90% on room air.      Clinton D. Maple Hudson, MD, [facility], FACP  Diplomate, Biomedical engineer of Sleep Medicine  Electronically Signed     CDY/MEDQ  D:  01/11/2009 16:12:52  T:  01/12/2009 01:15:08  Job:  [id number]

## 2010-10-02 NOTE — Discharge Summary (Signed)
NAMESHARRONDA, SCHWEERS              ACCOUNT NO.:  [id number]   MEDICAL RECORD NO.:  [id number]          PATIENT TYPE:  INP   LOCATION:  1611                         FACILITY:  [facility]   PHYSICIAN:  Melissa L. Ladona Ridgel, MD  DATE OF BIRTH:  [birth year]   DATE OF ADMISSION:  11/18/2005  DATE OF DISCHARGE:  11/19/2005                                 DISCHARGE SUMMARY   CHIEF COMPLAINT:  Left chest and abdominal pain.   DISCHARGE DIAGNOSIS:  1.  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  The patient was      admitted to the hospital and started on Lovenox.  She, unfortunately,      was given a heparin bolus prior to obtaining hypercoagulability panel.      The likely cause of her current condition may be Accutane combined with      oral contraceptives and recent car travel long distance.  The patient      had Dopplers of the lower extremities which showed no deep venous      thrombosis.  The plan of action is that the patient will be discharged      to home on Lovenox and Coumadin.  She will follow up with Dr. Leda Quail, her gynecologist, until she obtains a primary care physician.  2.  Menometrorrhagia.  The patient has been working with Dr.  Hyacinth Meeker      regarding her excessive menstrual bleeding.  I have encouraged her to      please keep her appointments and set a plan with Dr. Hyacinth Meeker with regard      to follow up care related to her excessive bleeding.  I have encouraged      her to continue to take her iron tablets and have discussed the fact      that during the course of her anticoagulation period that she may need      to have a blood transfusion if her anemia becomes a problem.  The      patient understands that she has some difficult decisions to make and      states that she will follow up and continue to follow up and use her      iron.  3.  Cystic acne.  The patient will need to be off her Accutane at this time.      She can follow up with her practitioner  with regards to other therapies      that may be available to her.   DISCHARGE MEDICATIONS:  Lovenox 90 mg subcutaneously every 12 hours,  Coumadin 5 mg once daily at bedtime, ferrous sulfate 325 mg t.i.d., Colace  100 mg b.i.d.  The patient has been instructed not to take her Accutane or  contraceptive.  I will provide her with some Percocet 5/325 mg for break  through pain.   DISCHARGE INSTRUCTIONS:  I have instructed her to follow up with Dr. Hyacinth Meeker  on Monday in order to talk about her menometrorrhagia and have her PT/INR  evaluated.  She is instructed to call Dr. Hyacinth Meeker  or come to the emergency  room  if she develops any nosebleeds, gum bleeds, or rectal bleeding.   HOSPITAL COURSE:  The patient is a very pleasant 45 year old [place]  American female with a past medical history of uterine fibroids and cystic  acne.  The patient has been treated with Accutane and oral contraceptive,  and over the past couple of weeks, has been traveling with her job.  She  states that she just returned back from [place] by car.  She related  that they did stop every couple of hours, but nonetheless, was a long trip.  The patient returned to the area and presented to the emergency room with  the acute onset of sudden severe lower back pain going to her left shoulder.  She was concerned about a urinary tract infection and, therefore, came in  for evaluation.  She was found to have a temperature of 101.3 but no  evidence for infection.  The patient was found to have a D-dimer of 1.11  and, therefore, was sent to CAT scan to evaluate for pulmonary embolus.  The  pulmonary CT showed left lower lobe PE with small pleural effusion.  The  patient was given pain medication and started on heparin and ultimately was  converted over to Lovenox.  The patient was seen and evaluated today and  feels that she can manage her Lovenox at home.  We have made arrangements  for Dr. Leda Quail to follow her  PT/INR until she establishes a primary  care physician.  I discussed with her the fact that she needs to partner  with Dr. Hyacinth Meeker with regards to her uterine fibroids and heavy menses and  that there may come a time over the next couple of times where may need  transfusion because she is not going to be able to be adequately treated for  her menometrorrhagia.   On the day of discharge, the patient is clinically stable.  The pain is  diminished.  Her temperature is 98.4, blood pressure 142/74, pulse 61,  respirations 20, saturation 99%.  In general, this is a well developed,  moderately overweight African American female in no acute distress.  She is  normocephalic, atraumatic.  Pupils equal, round, reactive to light.  Extraocular movements intact.  Mucous membranes are moist.  Neck supple,  there was no JVD, no carotid bruits.  Her chest shows crackles at the left  base but no rhonchi, rales, or wheezes.  Abdomen was soft, nontender,  nondistended.  Extremities showed no cyanosis, clubbing, and edema.   PERTINENT LABORATORY DATA:  Hemoglobin 10.3, hematocrit 31.4.  Her  discharging PT/INR was 15 and 1.2.  She received 7.5 of Coumadin on the  morning of admission and will receive 5 mg at the time of discharge.   At this time the patient seems stable, as long as she can perform her own  Lovenox injection, she will be discharged to home and follow up with Dr.  Hyacinth Meeker on Monday.      Melissa L. Ladona Ridgel, MD  Electronically Signed     MLT/MEDQ  D:  11/19/2005  T:  11/19/2005  Job:  [id number]   cc:   M. Leda Quail, MD

## 2010-10-02 NOTE — H&P (Signed)
[name], Poole NO.:  [id number]   MEDICAL RECORD NO.:  [id number]          PATIENT TYPE:  EMS   LOCATION:  ED                           FACILITY:  [facility]   PHYSICIAN:  Hollice Espy, M.D.DATE OF BIRTH:  1965-05-26   DATE OF ADMISSION:  11/18/2005  DATE OF DISCHARGE:                                HISTORY & PHYSICAL   PRIMARY CARE PHYSICIAN:  None.   CHIEF COMPLAINT:  Pain in the left flank and back up the left shoulder.   HISTORY OF PRESENT ILLNESS:  The patient is a 45 year old African American  female with a past medical history of uterine fibroids and cystic acne on  Accutane, who presents to the emergency room with complaints of one day of  sudden onset severe lower back pain going all the way up to her left  shoulder.  The patient was concerned that she may have a severe urinary  tract infection and came in for further evaluation.  She was noted on  admission to have a temperature of 101.3, however corresponding lab work  noted low lung volumes with bibasilar atelectasis and cardiac prominence on  chest x-ray and completely normal urine.  Normal white count with no shift  but a d-dimer was elevated at 1.11.  There was suspicion that perhaps the  patient had a pulmonary embolus.  A CT angio of the chest was done which  showed a positive exam for pulmonary embolism with filling defect located in  the medial basilar segment of the pulmonary artery in the left lower lobe,  marked atelectasis of the left lower lobe with associated small left pleural  effusion was noted.  The patient was given pain medication, given a bolus of  heparin and oxygen.  She states that she is starting to feel a Farewell  better.  In terms of review for causes of her blood clot, the patient tells  me that she knows of no family history, she does not smoke, she is on birth  control as well as Accutane.  She tells me that she was on a recent plane  ride but it was just from  [place] to [place] which was about one  hour and she drove back with friends from [place] however they stopped at  least every one to two hours and she got out and stretched her legs.  Currently, the patient is doing well.  She denies any headaches, vision  changes, dysphagia, chest pain, palpitations, shortness of breath, wheeze,  or cough.  She still complains of some pain which she can feel but it is  much more muted after pain medication on her back.  No abdominal pain.  No  hematuria, dysuria, constipation, diarrhea, focal extremity numbness,  weakness or pain.   REVIEW OF SYSTEMS:  Otherwise negative.   PAST MEDICAL HISTORY:  1.  Uterine fibroids with heavy menses with secondary iron deficiency      anemia.  2.  History of cystic acne.   MEDICATIONS:  She is on Accutane and iron supplements.   SOCIAL HISTORY:  She denies any tobacco,  alcohol or drug use.   ALLERGIES:  SHE IS ALLERGIC TO MINOCIN.   FAMILY HISTORY:  Negative for any type of blood clot.   PHYSICAL EXAMINATION:  VITAL SIGNS ON ADMISSION:  Temperature 101.3, heart  rate 87, blood pressure 136/92, respirations 16, O2 sat 98% on room air.  GENERAL:  The patient is alert and oriented x3 in no apparent distress.  HEENT:  Normocephalic, atraumatic.  Mucous membranes are moist.  NECK:  She has no carotid bruits.  HEART:  Regular rate and rhythm, S1, S2.  LUNGS:  Clear to auscultation bilaterally.  ABDOMEN:  Soft, nontender, nondistended, positive bowel sounds.  EXTREMITIES:  Show no clubbing, cyanosis or edema.   LABORATORY DATA:  Urinalysis is negative.  White count 6.8, no shift.  H&H  10.2 and 30.5.  MCV of 79.  Platelet count of 291.  D-dimer 1.11.  Sodium  133, potassium 2.8, chloride 100, bicarb 27, BUN 11, creatinine 0.7, glucose  121.  Coag's are normal.  CT and chest x-ray are as per HPI.   ASSESSMENT/PLAN:  1.  Pulmonary embolism.  The most likely etiology of his pulmonary embolism      is the  patient is on Accutane +/- birth control pills.  I have discussed      this with the patient and she plans to discontinue the Accutane.  If she      discontinues the Accutane I would say that it would be okay for her to      continue on birth control pills.  In the interim, I have put her on      Lovenox and Coumadin and we will see if possible home health can be set      up for the patient to go home on Lovenox and Coumadin.  We will need to      set the patient up with a primary care physician to follow her Coumadin      levels for the next few months.  At this time, I see no reason given      that we have somewhat of an etiology to plan for life long Coumadin or      check any genetic marker studies.  2.  History of uterine fibroids.  The patient has heavy menses with these      and we need to be especially concerned about blood loss while on      Coumadin.  3.  Iron deficiency anemia causing microcytic anemia secondary to number      two, continue to follow.  4.  Cystic acne.  The patient is going to stop her Accutane.      Hollice Espy, M.D.  Electronically Signed     SKK/MEDQ  D:  11/19/2005  T:  11/19/2005  Job:  [id number]

## 2011-01-15 ENCOUNTER — Emergency Department (HOSPITAL_BASED_OUTPATIENT_CLINIC_OR_DEPARTMENT_OTHER)
Admission: EM | Admit: 2011-01-15 | Discharge: 2011-01-15 | Disposition: A | Discharge status: Home / Self Care | Payer: BC Managed Care – PPO | Attending: Emergency Medicine | Admitting: Emergency Medicine

## 2011-01-15 DIAGNOSIS — R51 Headache: Secondary | ICD-10-CM | POA: Insufficient documentation

## 2011-01-15 DIAGNOSIS — I10 Essential (primary) hypertension: Secondary | ICD-10-CM | POA: Insufficient documentation

## 2011-01-15 HISTORY — DX: Essential (primary) hypertension: I10

## 2011-01-15 LAB — BASIC METABOLIC PANEL
BUN: 10 mg/dL (ref 6–23)
CO2: 26 mEq/L (ref 19–32)
Calcium: 9.4 mg/dL (ref 8.4–10.5)
GFR calc non Af Amer: >60 mL/min (ref >60)
Glucose, Bld: 109 mg/dL — ABNORMAL HIGH (ref 70–99)
Potassium: 3.3 mEq/L — ABNORMAL LOW (ref 3.5–5.1)
Sodium: 135 mEq/L (ref 135–145)

## 2011-01-15 MED ORDER — HYDROCODONE-ACETAMINOPHEN 5-325 MG PO TABS
2.0000 | ORAL_TABLET | Freq: Once | ORAL | Status: AC
  Administered 2011-01-15: 1 via ORAL

## 2011-01-15 MED ORDER — HYDROCODONE-ACETAMINOPHEN 5-325 MG PO TABS
ORAL_TABLET | ORAL | Status: AC
  Administered 2011-01-15: 1 via ORAL
  Filled 2011-01-15: qty 2

## 2011-01-15 NOTE — ED Notes (Signed)
Elevated bllod pressure.  States she had a headache, not feeling well and check BP at home and was noted to be 170/101.

## 2011-01-15 NOTE — ED Notes (Signed)
Pt refused re-collect for CBC. Langston Masker, PA notified.

## 2011-01-15 NOTE — ED Provider Notes (Signed)
History     CSN: [id number] Arrival date & time: 01/15/2011  2:50 PM  Chief Complaint  Patient presents with  . Hypertension  . Headache   Patient is a 45 y.o. female presenting with hypertension.  Hypertension This is a recurrent problem. The current episode started yesterday. The problem occurs constantly. The problem has been gradually worsening. Pertinent negatives include no abdominal pain, coughing, nausea, neck pain, vertigo, vomiting or weakness. The symptoms are aggravated by nothing. She has tried nothing for the symptoms. The treatment provided no relief.  Pt reports blood pressure has been elevated since yesterday.  Pt reports no change in medications or in diet.  Pt reports her blood pressure had been well controlled but now is running high again.  Pt reports she had a headache today when blood pressure was high.  Past Medical History  Diagnosis Date  . Hypertension     Past Surgical History  Procedure Date  . Myomectomy   . Oophorectomy     No family history on file.  History  Substance Use Topics  . Smoking status: Never Smoker   . Smokeless tobacco: Not on file  . Alcohol Use: Yes     occasionally    OB History    Grav Para Term Preterm Abortions TAB SAB Ect Mult Living                  Review of Systems  HENT: Negative for neck pain.   Respiratory: Negative for cough.   Gastrointestinal: Negative for nausea, vomiting and abdominal pain.  Neurological: Negative for vertigo and weakness.  All other systems reviewed and are negative.    Physical Exam  BP 177/91  Pulse 58  Temp(Src) 98 F (36.7 C) (Oral)  Resp 16  Ht 5\' 5"  (1.651 m)  Wt 199 lb (90.266 kg)  BMI 33.12 kg/m2  SpO2 99%  LMP 01/15/2011  Physical Exam  Nursing note and vitals reviewed. Constitutional: She is oriented to person, place, and time. She appears well-developed and well-nourished.  HENT:  Head: Normocephalic and atraumatic.  Eyes: Conjunctivae and EOM are normal.  Pupils are equal, round, and reactive to light.  Neck: Normal range of motion. Neck supple.  Cardiovascular: Normal rate.   Pulmonary/Chest: Effort normal and breath sounds normal.  Abdominal: Soft. Bowel sounds are normal.  Musculoskeletal: Normal range of motion.  Neurological: She is alert and oriented to person, place, and time.  Skin: Skin is warm.  Psychiatric: She has a normal mood and affect.    ED Course  Procedures  MDM BMET normal.  Medications reviewed Pt's headache treated with hydrocodone.  I will increase metoprolol.  Pt advised to see her MD for recheck tommorow.      [name], [place] 01/15/11 1807

## 2011-01-15 NOTE — ED Provider Notes (Signed)
Medical screening examination/treatment/procedure(s) were performed by non-physician practitioner and as supervising physician I was immediately available for consultation/collaboration.  Forbes Cellar, MD 01/15/11 1827

## 2011-09-30 ENCOUNTER — Emergency Department (HOSPITAL_BASED_OUTPATIENT_CLINIC_OR_DEPARTMENT_OTHER)
Admission: EM | Admit: 2011-09-30 | Discharge: 2011-09-30 | Disposition: A | Discharge status: Home / Self Care | Payer: BC Managed Care – PPO | Attending: Emergency Medicine | Admitting: Emergency Medicine

## 2011-09-30 ENCOUNTER — Encounter (HOSPITAL_BASED_OUTPATIENT_CLINIC_OR_DEPARTMENT_OTHER): Payer: Self-pay

## 2011-09-30 DIAGNOSIS — Z79899 Other long term (current) drug therapy: Secondary | ICD-10-CM | POA: Insufficient documentation

## 2011-09-30 DIAGNOSIS — I10 Essential (primary) hypertension: Secondary | ICD-10-CM | POA: Insufficient documentation

## 2011-09-30 DIAGNOSIS — R509 Fever, unspecified: Secondary | ICD-10-CM | POA: Insufficient documentation

## 2011-09-30 DIAGNOSIS — R05 Cough: Secondary | ICD-10-CM | POA: Insufficient documentation

## 2011-09-30 DIAGNOSIS — R059 Cough, unspecified: Secondary | ICD-10-CM | POA: Insufficient documentation

## 2011-09-30 NOTE — ED Notes (Signed)
C/o "just feeling bad", fever started yesterday-was up to 101 yesterday-states temp PTA was "106" x 2 which scared pt -no meds for fever today-pt NAD-ambulatory w/o distress-oral temp 99.1 x 2 in triage

## 2011-09-30 NOTE — ED Provider Notes (Signed)
History     CSN: [id number]  Arrival date & time 09/30/11  2216   First MD Initiated Contact with Patient 09/30/11 2235      Chief Complaint  Patient presents with  . Fever    (Consider location/radiation/quality/duration/timing/severity/associated sxs/prior treatment) HPI Comments: Patient presents tonight after she had a fever to approximately 101 last night for which she took ibuprofen and recurrent fevers today.  She notes that all through today her temperatures were approximately 99.  This evening she checked her temperature again in she had to temperatures of 106 and then 107.  She believes this to be erroneous but out of the consideration that she felt her temperature might still really be up she came to the emergency department for further evaluation.  She notes very mild dry cough but no significant productive cough or shortness of breath.  No sinus congestion.  No sore throat.  No nausea, vomiting, diarrhea or dysuria.  Patient has no abdominal pain.  No chest pain.  No significant headaches.  Patient is a 46 y.o. female presenting with fever. The history is provided by the patient. No language interpreter was used.  Fever Primary symptoms of the febrile illness include fever and cough. Primary symptoms do not include headaches, shortness of breath, abdominal pain, nausea, vomiting, diarrhea, dysuria or rash.    Past Medical History  Diagnosis Date  . Hypertension     Past Surgical History  Procedure Date  . Myomectomy   . Oophorectomy     History reviewed. No pertinent family history.  History  Substance Use Topics  . Smoking status: Never Smoker   . Smokeless tobacco: Not on file  . Alcohol Use: Yes     occasionally    OB History    Grav Para Term Preterm Abortions TAB SAB Ect Mult Living                  Review of Systems  Constitutional: Positive for fever. Negative for chills.  HENT: Negative.   Eyes: Negative.  Negative for discharge and redness.    Respiratory: Positive for cough. Negative for shortness of breath.   Cardiovascular: Negative.  Negative for chest pain.  Gastrointestinal: Negative.  Negative for nausea, vomiting, abdominal pain and diarrhea.  Genitourinary: Negative.  Negative for dysuria and vaginal discharge.  Musculoskeletal: Negative.  Negative for back pain.  Skin: Negative.  Negative for color change and rash.  Neurological: Negative.  Negative for syncope and headaches.  Hematological: Negative.  Negative for adenopathy.  Psychiatric/Behavioral: Negative.  Negative for confusion.  All other systems reviewed and are negative.    Allergies  Minocycline hcl  Home Medications   Current Outpatient Rx  Name Route Sig Dispense Refill  . FUROSEMIDE 20 MG PO TABS Oral Take 20 mg by mouth as needed. Weight gain    . HYDROCHLOROTHIAZIDE 25 MG PO TABS Oral Take 25 mg by mouth daily.      Marland Kitchen METOPROLOL TARTRATE 50 MG PO TABS Oral Take 50 mg by mouth 2 (two) times daily.      Marland Kitchen GUMMI BEAR MULTIVITAMIN/MIN PO Oral Take 2 tablets by mouth daily.      Marland Kitchen SALINE NASAL SPRAY 0.65 % NA SOLN Nasal Place 2 sprays into the nose as needed. congestion     . VALSARTAN 80 MG PO TABS Oral Take 80 mg by mouth daily.        BP 136/81  Pulse 98  Temp(Src) 99.1 F (37.3 C) (Oral)  Resp 16  Ht 5\' 5"  (1.651 m)  Wt 195 lb (88.451 kg)  BMI 32.45 kg/m2  SpO2 98%  LMP 09/28/2011  Physical Exam  Nursing note and vitals reviewed. Constitutional: She is oriented to person, place, and time. She appears well-developed and well-nourished.  Non-toxic appearance. She does not have a sickly appearance.  HENT:  Head: Normocephalic and atraumatic.  Mouth/Throat: Oropharynx is clear and moist. No oropharyngeal exudate.       No sinus tenderness on exam  Eyes: Conjunctivae, EOM and lids are normal. Pupils are equal, round, and reactive to light. No scleral icterus.  Neck: Trachea normal and normal range of motion. Neck supple.   Cardiovascular: Normal rate, regular rhythm and normal heart sounds.   Pulmonary/Chest: Effort normal and breath sounds normal. No respiratory distress. She has no wheezes. She has no rales.  Abdominal: Soft. Normal appearance. There is no tenderness. There is no rebound, no guarding and no CVA tenderness.  Musculoskeletal: Normal range of motion.  Lymphadenopathy:    She has no cervical adenopathy.  Neurological: She is alert and oriented to person, place, and time. She has normal strength.  Skin: Skin is warm, dry and intact. No rash noted.  Psychiatric: She has a normal mood and affect. Her behavior is normal. Judgment and thought content normal.    ED Course  Procedures (including critical care time)  Labs Reviewed - No data to display No results found.   1. Fever       MDM  Patient with likely thermometer malfunction at home given reported temperatures of 106 or 107.  Patient has no acute signs of infection such as pneumonia, sinusitis, pharyngitis, UTI or other intra-abdominal causes to warrant further testing at this time.  Patient feels well.  I've advised her that if she has persistent fever she can use Tylenol and ibuprofen at home and if she develops further symptoms to seek reevaluation by her primary care physician or in the emergency department if needed.        Nat Christen, MD 09/30/11 [phone]

## 2011-09-30 NOTE — Discharge Instructions (Signed)
Fever, Adult A fever is a higher than normal body temperature. In an adult, an oral temperature around 98.6 F (37 C) is considered normal. A temperature of 100.4 F (38 C) or higher is generally considered a fever. Mild or moderate fevers generally have no long-term effects and often do not require treatment. Extreme fever (greater than or equal to 106 F or 41.1 C) can cause seizures. The sweating that may occur with repeated or prolonged fever may cause dehydration. Elderly people can develop confusion during a fever. A measured temperature can vary with:  Age.   Time of day.   Method of measurement (mouth, underarm, rectal, or ear).  The fever is confirmed by taking a temperature with a thermometer. Temperatures can be taken different ways. Some methods are accurate and some are not.  An oral temperature is used most commonly. Electronic thermometers are fast and accurate.   An ear temperature will only be accurate if the thermometer is positioned as recommended by the manufacturer.   A rectal temperature is accurate and done for those adults who have a condition where an oral temperature cannot be taken.   An underarm (axillary) temperature is not accurate and not recommended.  Fever is a symptom, not a disease.  CAUSES   Infections commonly cause fever.   Some noninfectious causes for fever include:   Some arthritis conditions.   Some thyroid or adrenal gland conditions.   Some immune system conditions.   Some types of cancer.   A medicine reaction.   High doses of certain street drugs such as methamphetamine.   Dehydration.   Exposure to high outside or room temperatures.   Occasionally, the source of a fever cannot be determined. This is sometimes called a "fever of unknown origin" (FUO).   Some situations may lead to a temporary rise in body temperature that may go away on its own. Examples are:   Childbirth.   Surgery.   Intense exercise.  HOME CARE  INSTRUCTIONS   Take appropriate medicines for fever. Follow dosing instructions carefully. If you use acetaminophen to reduce the fever, be careful to avoid taking other medicines that also contain acetaminophen. Do not take aspirin for a fever if you are younger than age 19. There is an association with Reye's syndrome. Reye's syndrome is a rare but potentially deadly disease.   If an infection is present and antibiotics have been prescribed, take them as directed. Finish them even if you start to feel better.   Rest as needed.   Maintain an adequate fluid intake. To prevent dehydration during an illness with prolonged or recurrent fever, you may need to drink extra fluid.Drink enough fluids to keep your urine clear or pale yellow.   Sponging or bathing with room temperature water may help reduce body temperature. Do not use ice water or alcohol sponge baths.   Dress comfortably, but do not over-bundle.  SEEK MEDICAL CARE IF:   You are unable to keep fluids down.   You develop vomiting or diarrhea.   You are not feeling at least partly better after 3 days.   You develop new symptoms or problems.  SEEK IMMEDIATE MEDICAL CARE IF:   You have shortness of breath or trouble breathing.   You develop excessive weakness.   You are dizzy or you faint.   You are extremely thirsty or you are making [place] or no urine.   You develop new pain that was not there before (such as in the head,   neck, chest, back, or abdomen).   You have persistant vomiting and diarrhea for more than 1 to 2 days.   You develop a stiff neck or your eyes become sensitive to light.   You develop a skin rash.   You have a fever or persistent symptoms for more than 2 to 3 days.   You have a fever and your symptoms suddenly get worse.  MAKE SURE YOU:   Understand these instructions.   Will watch your condition.   Will get help right away if you are not doing well or get worse.  Document Released:  10/27/2000 Document Revised: 04/22/2011 Document Reviewed: 03/04/2011 [employer] Patient Information 2012 [employer]. 

## 2011-09-30 NOTE — ED Notes (Signed)
Pt reports "not feeling good".  Weak.  Low grade fever at home.  Has not taken any meds for fever.  Has eaten without nausea.  Some HA.

## 2011-11-15 DIAGNOSIS — R87619 Unspecified abnormal cytological findings in specimens from cervix uteri: Secondary | ICD-10-CM

## 2011-11-15 DIAGNOSIS — IMO0002 Reserved for concepts with insufficient information to code with codable children: Secondary | ICD-10-CM

## 2011-11-15 HISTORY — DX: Reserved for concepts with insufficient information to code with codable children: IMO0002

## 2011-11-15 HISTORY — DX: Unspecified abnormal cytological findings in specimens from cervix uteri: R87.619

## 2012-02-12 ENCOUNTER — Emergency Department (HOSPITAL_COMMUNITY)
Admission: EM | Admit: 2012-02-12 | Discharge: 2012-02-12 | Disposition: A | Discharge status: Home / Self Care | Payer: BC Managed Care – PPO | Attending: Emergency Medicine | Admitting: Emergency Medicine

## 2012-02-12 ENCOUNTER — Encounter (HOSPITAL_COMMUNITY): Payer: Self-pay | Admitting: Emergency Medicine

## 2012-02-12 ENCOUNTER — Emergency Department (HOSPITAL_COMMUNITY): Payer: BC Managed Care – PPO

## 2012-02-12 DIAGNOSIS — R109 Unspecified abdominal pain: Secondary | ICD-10-CM | POA: Insufficient documentation

## 2012-02-12 DIAGNOSIS — I10 Essential (primary) hypertension: Secondary | ICD-10-CM | POA: Insufficient documentation

## 2012-02-12 DIAGNOSIS — Z7982 Long term (current) use of aspirin: Secondary | ICD-10-CM | POA: Insufficient documentation

## 2012-02-12 HISTORY — DX: Other pulmonary embolism without acute cor pulmonale: I26.99

## 2012-02-12 LAB — URINALYSIS, ROUTINE W REFLEX MICROSCOPIC
Leukocytes, UA: NEGATIVE
Nitrite: NEGATIVE
Protein, ur: NEGATIVE mg/dL
Specific Gravity, Urine: 1.017 (ref 1.005–1.030)
Urobilinogen, UA: 0.2 mg/dL (ref 0.0–1.0)

## 2012-02-12 LAB — COMPREHENSIVE METABOLIC PANEL
Alkaline Phosphatase: 77 U/L (ref 39–117)
BUN: 13 mg/dL (ref 6–23)
Chloride: 100 mEq/L (ref 96–112)
Creatinine, Ser: 0.68 mg/dL (ref 0.50–1.10)
GFR calc Af Amer: >90 mL/min (ref >90)
Glucose, Bld: 81 mg/dL (ref 70–99)
Potassium: 3.6 mEq/L (ref 3.5–5.1)
Total Bilirubin: 0.1 mg/dL — ABNORMAL LOW (ref 0.3–1.2)

## 2012-02-12 LAB — CBC
Platelets: 245 10*3/uL (ref 150–400)
RBC: 3.84 MIL/uL — ABNORMAL LOW (ref 3.87–5.11)
RDW: 15.5 % (ref 11.5–15.5)
WBC: 5.5 10*3/uL (ref 4.0–10.5)

## 2012-02-12 LAB — PREGNANCY, URINE: Preg Test, Ur: NEGATIVE

## 2012-02-12 LAB — LIPASE, BLOOD: Lipase: 39 U/L (ref 11–59)

## 2012-02-12 NOTE — ED Provider Notes (Signed)
History     CSN: [id number]  Arrival date & time 02/12/12  1051   First MD Initiated Contact with Patient 02/12/12 1303     Chief complaint: abdominal discomfort.    (Consider location/radiation/quality/duration/timing/severity/associated sxs/prior treatment) The history is provided by the patient.  pt notes after eating breakfast this morning, onset dull, discomfort sensation, mid to upper abdomen, moved laterally towards bil flank.  No hx same pain. Pain started with eating, since no exacerbating or alleviating factors. Pain is not pleuritic. No nvd. Having normal bms. No hx gallstones. No hx pud. No hx pancreatitis. Pt notes hx uterine fibroids, prior surgery for same. lnmp 2-3 weeks ago, normal. No vaginal discharge or bleeding. No dysuria or gu c/o. Denies any cp or discomfort. No sob. No heartburn.    Past Medical History  Diagnosis Date  . Hypertension   . Pulmonary embolism 2008    Past Surgical History  Procedure Date  . Myomectomy   . Oophorectomy     No family history on file.  History  Substance Use Topics  . Smoking status: Never Smoker   . Smokeless tobacco: Never Used  . Alcohol Use: Yes     occasionally    OB History    Grav Para Term Preterm Abortions TAB SAB Ect Mult Living                  Review of Systems  Constitutional: Negative for fever and chills.  HENT: Negative for neck pain.   Eyes: Negative for pain.  Respiratory: Negative for shortness of breath.   Cardiovascular: Negative for chest pain and leg swelling.  Gastrointestinal: Positive for abdominal pain. Negative for vomiting.  Genitourinary: Negative for dysuria, flank pain, vaginal bleeding and vaginal discharge.  Musculoskeletal: Negative for back pain.       No leg pain or swelling.   Skin: Negative for rash.  Neurological: Negative for headaches.  Hematological: Does not bruise/bleed easily.  Psychiatric/Behavioral: Negative for confusion.    Allergies  Minocycline  hcl  Home Medications   Current Outpatient Rx  Name Route Sig Dispense Refill  . ASPIRIN EC 81 MG PO TBEC Oral Take 81 mg by mouth daily.    . DEXLANSOPRAZOLE 60 MG PO CPDR Oral Take 60 mg by mouth daily.    . FUROSEMIDE 20 MG PO TABS Oral Take 20 mg by mouth daily as needed. Weight gain    . HYDROCHLOROTHIAZIDE 25 MG PO TABS Oral Take 25 mg by mouth daily.      Marland Kitchen METOPROLOL TARTRATE 50 MG PO TABS Oral Take 50 mg by mouth daily.     Marland Kitchen GUMMI BEAR MULTIVITAMIN/MIN PO Oral Take 2 tablets by mouth daily.      Marland Kitchen SALINE NASAL SPRAY 0.65 % NA SOLN Nasal Place 2 sprays into the nose as needed. congestion     . VALSARTAN 80 MG PO TABS Oral Take 80 mg by mouth daily.        BP 142/84  Pulse 97  Temp 98.4 F (36.9 C) (Oral)  Resp 16  SpO2 99%  LMP 01/16/2012  Physical Exam  Nursing note and vitals reviewed. Constitutional: She appears well-developed and well-nourished. No distress.  HENT:  Nose: Nose normal.  Mouth/Throat: Oropharynx is clear and moist.  Eyes: Conjunctivae normal are normal. No scleral icterus.  Neck: Neck supple. No tracheal deviation present.  Cardiovascular: Normal rate, regular rhythm, normal heart sounds and intact distal pulses.  Exam reveals no gallop and no friction  rub.   No murmur heard. Pulmonary/Chest: Effort normal and breath sounds normal. No respiratory distress.  Abdominal: Soft. Normal appearance and bowel sounds are normal. She exhibits no distension and no mass. There is no tenderness. There is no rebound and no guarding.  Genitourinary:       No cva tenderness  Musculoskeletal: She exhibits no edema and no tenderness.  Neurological: She is alert.  Skin: Skin is warm and dry. No rash noted.    ED Course  Procedures (including critical care time)  Results for orders placed during the hospital encounter of 02/12/12  COMPREHENSIVE METABOLIC PANEL      Component Value Range   Sodium 136  135 - 145 mEq/L   Potassium 3.6  3.5 - 5.1 mEq/L    Chloride 100  96 - 112 mEq/L   CO2 28  19 - 32 mEq/L   Glucose, Bld 81  70 - 99 mg/dL   BUN 13  6 - 23 mg/dL   Creatinine, Ser 1.61  0.50 - 1.10 mg/dL   Calcium 9.2  8.4 - 09.6 mg/dL   Total Protein 8.6 (*) 6.0 - 8.3 g/dL   Albumin 3.3 (*) 3.5 - 5.2 g/dL   AST 19  0 - 37 U/L   ALT 17  0 - 35 U/L   Alkaline Phosphatase 77  39 - 117 U/L   Total Bilirubin 0.1 (*) 0.3 - 1.2 mg/dL   GFR calc non Af Amer >90  >90 mL/min   GFR calc Af Amer >90  >90 mL/min  CBC      Component Value Range   WBC 5.5  4.0 - 10.5 K/uL   RBC 3.84 (*) 3.87 - 5.11 MIL/uL   Hemoglobin 10.3 (*) 12.0 - 15.0 g/dL   HCT 04.5 (*) 40.9 - 81.1 %   MCV 83.9  78.0 - 100.0 fL   MCH 26.8  26.0 - 34.0 pg   MCHC 32.0  30.0 - 36.0 g/dL   RDW 91.4  78.2 - 95.6 %   Platelets 245  150 - 400 K/uL  LIPASE, BLOOD      Component Value Range   Lipase 39  11 - 59 U/L  URINALYSIS, ROUTINE W REFLEX MICROSCOPIC      Component Value Range   Color, Urine YELLOW  YELLOW   APPearance CLEAR  CLEAR   Specific Gravity, Urine 1.017  1.005 - 1.030   pH 6.5  5.0 - 8.0   Glucose, UA NEGATIVE  NEGATIVE mg/dL   Hgb urine dipstick NEGATIVE  NEGATIVE   Bilirubin Urine NEGATIVE  NEGATIVE   Ketones, ur NEGATIVE  NEGATIVE mg/dL   Protein, ur NEGATIVE  NEGATIVE mg/dL   Urobilinogen, UA 0.2  0.0 - 1.0 mg/dL   Nitrite NEGATIVE  NEGATIVE   Leukocytes, UA NEGATIVE  NEGATIVE  PREGNANCY, URINE      Component Value Range   Preg Test, Ur NEGATIVE  NEGATIVE   US Abdomen Complete  02/12/2012  *RADIOLOGY REPORT*  Clinical Data:  Abdominal pain  COMPLETE ABDOMINAL ULTRASOUND  Comparison:  None.  Findings:  Gallbladder:  No gallstones, gallbladder wall thickening, or pericholecystic fluid.  Negative sonographic Murphy's sign.  Common bile duct:  Measures 4 mm.  Liver:  No focal lesion identified.  Within normal limits in parenchymal echogenicity.  IVC:  Poorly visualized.  Pancreas:  Incompletely visualized but grossly unremarkable.  Spleen:  Measures 10.2  cm.  Right Kidney:  Measures 11.7 cm.  1.5 x 1.4 x 2.0  cm renal sinus cyst.  Left Kidney:  Measures 12.3 cm.  Mild fullness of the renal pelvis.  Abdominal aorta:  No aneurysm identified.  Additional comments:  Bilateral bladder jets are present.  IMPRESSION: Mild fullness of the left renal pelvis.  Otherwise negative abdominal ultrasound.   Original Report Authenticated By: Charline Bills, M.D.       MDM  Labs.  U/s.    Recheck pt, no pain or discomfort. No fever. No nv. No cp. No sob. Feels at baseline.  abd soft nt.   Pt appears stable for d/c.       Suzi Roots, MD 02/12/12 1530

## 2012-02-12 NOTE — ED Notes (Signed)
Pt discharged to home. Left unit ambulating to checkout accompanied by significant other. Left in good condition.

## 2012-02-12 NOTE — ED Notes (Addendum)
Pt reports shortness of breath and tingling in the abdomen and lower back. Respirations equal, symmetrical, and 21 per minute. Cap refill <3 seconds. Strong hand grips, dorsiflexion, and plantar flexion. Pt has history of PE and reports traveling by car to [place], returning this past Monday. NAD.

## 2012-06-09 ENCOUNTER — Other Ambulatory Visit: Payer: Self-pay | Admitting: Internal Medicine

## 2012-06-09 DIAGNOSIS — R202 Paresthesia of skin: Secondary | ICD-10-CM

## 2012-06-09 DIAGNOSIS — R42 Dizziness and giddiness: Secondary | ICD-10-CM

## 2012-06-14 ENCOUNTER — Ambulatory Visit
Admission: RE | Admit: 2012-06-14 | Discharge: 2012-06-14 | Disposition: A | Discharge status: Home / Self Care | Payer: BC Managed Care – PPO | Source: Ambulatory Visit | Attending: Internal Medicine | Admitting: Internal Medicine

## 2012-06-14 DIAGNOSIS — R202 Paresthesia of skin: Secondary | ICD-10-CM

## 2012-06-14 DIAGNOSIS — R42 Dizziness and giddiness: Secondary | ICD-10-CM

## 2012-06-14 MED ORDER — GADOBENATE DIMEGLUMINE 529 MG/ML IV SOLN
18.0000 mL | Freq: Once | INTRAVENOUS | Status: AC | PRN
  Administered 2012-06-14: 18 mL via INTRAVENOUS

## 2012-11-02 ENCOUNTER — Encounter: Payer: Self-pay | Admitting: Obstetrics and Gynecology

## 2012-11-03 ENCOUNTER — Encounter: Payer: Self-pay | Admitting: Obstetrics and Gynecology

## 2012-11-03 ENCOUNTER — Telehealth: Payer: Self-pay | Admitting: Obstetrics and Gynecology

## 2012-11-03 ENCOUNTER — Ambulatory Visit (INDEPENDENT_AMBULATORY_CARE_PROVIDER_SITE_OTHER): Payer: BC Managed Care – PPO | Admitting: Obstetrics and Gynecology

## 2012-11-03 VITALS — BP 110/60 | HR 76 | Ht 64.5 in | Wt 197.0 lb

## 2012-11-03 DIAGNOSIS — R300 Dysuria: Secondary | ICD-10-CM

## 2012-11-03 DIAGNOSIS — B3731 Acute candidiasis of vulva and vagina: Secondary | ICD-10-CM

## 2012-11-03 DIAGNOSIS — B373 Candidiasis of vulva and vagina: Secondary | ICD-10-CM

## 2012-11-03 LAB — POCT URINALYSIS DIPSTICK
Blood, UA: NEGATIVE
Ketones, UA: NEGATIVE
Protein, UA: NEGATIVE
pH, UA: 5

## 2012-11-03 MED ORDER — NYSTATIN-TRIAMCINOLONE 100000-0.1 UNIT/GM-% EX CREA: TOPICAL_CREAM | Freq: Two times a day (BID) | CUTANEOUS | Status: DC

## 2012-11-03 MED ORDER — FLUCONAZOLE 150 MG PO TABS: 150.0000 mg | ORAL_TABLET | Freq: Once | ORAL | Status: DC

## 2012-11-03 NOTE — Telephone Encounter (Signed)
LMTCB  aa 

## 2012-11-03 NOTE — Telephone Encounter (Signed)
Patient went to get RX filled and is to much. Can she call in something else?

## 2012-11-03 NOTE — Progress Notes (Signed)
Patient ID: [name], female   DOB: June 30, 1965, 47 y.o.   MRN: [id number]  Subjective  47 year old [id number] female presents with burning on outside of vulva with voiding. Traveled out of town last week and used new soap. Only left side is bothering patient.  No visible sore. No discharge. No odor.   No new sexual partners.   Has not done any OTC treatments.   Just finished period. History of a vulvar abscess that drained on its own.    Seen in February for bacterial vaginosis.  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  Objective  Vulva with some thickened discharge of labial folds.  No lesions Vagina with some thickened white discharge.  No odor. Cervix no lesions. Uterus 17 - 18 week size and globular.  Cannot feel adnexa separate from ovaries.  Wet prep - pH 4.5 Positive for hyphae.  Neg for clue cells, trichomonas.  Whiff neg.  Assessment  Yeast vulvovaginitis. Possible contact dermatitis. Fibroids History of abnormal paps.  Plan  Diflucan. Mycolog II cream. Avoid irritants. Keep appointment for annual exam.

## 2012-11-03 NOTE — Telephone Encounter (Signed)
Pt called back to say she was able to get a smaller tube, and that she will use that. The area to be treated is small, and pt thinks it will be enough. Pt appreciative of our help, and will call back with any other problems.

## 2012-11-03 NOTE — Telephone Encounter (Signed)
Spoke with pt who reports the mycolog was $40 even with her insurance. Pt picked up the diflucan and already took it. Pt wondering if there is a cheaper alternative to mycolog. Pt also planning to call pharmacy back to see if there is a smaller tube of the mycolog that would be cheaper. Any advice?

## 2012-11-03 NOTE — Patient Instructions (Signed)
Monilial Vaginitis  Vaginitis in a soreness, swelling and redness (inflammation) of the vagina and vulva. Monilial vaginitis is not a sexually transmitted infection.  CAUSES   Yeast vaginitis is caused by yeast (candida) that is normally found in your vagina. With a yeast infection, the candida has overgrown in number to a point that upsets the chemical balance.  SYMPTOMS   · White, thick vaginal discharge.  · Swelling, itching, redness and irritation of the vagina and possibly the lips of the vagina (vulva).  · Burning or painful urination.  · Painful intercourse.  DIAGNOSIS   Things that may contribute to monilial vaginitis are:  · Postmenopausal and virginal states.  · Pregnancy.  · Infections.  · Being tired, sick or stressed, especially if you had monilial vaginitis in the past.  · Diabetes. Good control will help lower the chance.  · Birth control pills.  · Tight fitting garments.  · Using bubble bath, feminine sprays, douches or deodorant tampons.  · Taking certain medications that kill germs (antibiotics).  · Sporadic recurrence can occur if you become ill.  TREATMENT   Your caregiver will give you medication.  · There are several kinds of anti monilial vaginal creams and suppositories specific for monilial vaginitis. For recurrent yeast infections, use a suppository or cream in the vagina 2 times a week, or as directed.  · Anti-monilial or steroid cream for the itching or irritation of the vulva may also be used. Get your caregiver's permission.  · Painting the vagina with methylene blue solution may help if the monilial cream does not work.  · Eating yogurt may help prevent monilial vaginitis.  HOME CARE INSTRUCTIONS   · Finish all medication as prescribed.  · Do not have sex until treatment is completed or after your caregiver tells you it is okay.  · Take warm sitz baths.  · Do not douche.  · Do not use tampons, especially scented ones.  · Wear cotton underwear.  · Avoid tight pants and panty  hose.  · Tell your sexual partner that you have a yeast infection. They should go to their caregiver if they have symptoms such as mild rash or itching.  · Your sexual partner should be treated as well if your infection is difficult to eliminate.  · Practice safer sex. Use condoms.  · Some vaginal medications cause latex condoms to fail. Vaginal medications that harm condoms are:  · Cleocin cream.  · Butoconazole (Femstat®).  · Terconazole (Terazol®) vaginal suppository.  · Miconazole (Monistat®) (may be purchased over the counter).  SEEK MEDICAL CARE IF:   · You have a temperature by mouth above 102° F (38.9° C).  · The infection is getting worse after 2 days of treatment.  · The infection is not getting better after 3 days of treatment.  · You develop blisters in or around your vagina.  · You develop vaginal bleeding, and it is not your menstrual period.  · You have pain when you urinate.  · You develop intestinal problems.  · You have pain with sexual intercourse.  Document Released: 02/10/2005 Document Revised: 07/26/2011 Document Reviewed: 10/25/2008  [employer]® Patient Information ©2014 [employer].

## 2012-12-04 ENCOUNTER — Ambulatory Visit (INDEPENDENT_AMBULATORY_CARE_PROVIDER_SITE_OTHER): Payer: BC Managed Care – PPO | Admitting: Certified Nurse Midwife

## 2012-12-04 ENCOUNTER — Encounter: Payer: Self-pay | Admitting: Certified Nurse Midwife

## 2012-12-04 VITALS — BP 118/70 | HR 78 | Resp 20 | Ht 64.0 in | Wt 197.0 lb

## 2012-12-04 DIAGNOSIS — D649 Anemia, unspecified: Secondary | ICD-10-CM

## 2012-12-04 DIAGNOSIS — Z0000 Encounter for general adult medical examination without abnormal findings: Secondary | ICD-10-CM

## 2012-12-04 DIAGNOSIS — N852 Hypertrophy of uterus: Secondary | ICD-10-CM

## 2012-12-04 DIAGNOSIS — Z01419 Encounter for gynecological examination (general) (routine) without abnormal findings: Secondary | ICD-10-CM

## 2012-12-04 DIAGNOSIS — R87610 Atypical squamous cells of undetermined significance on cytologic smear of cervix (ASC-US): Secondary | ICD-10-CM

## 2012-12-04 LAB — POCT URINALYSIS DIPSTICK
Bilirubin, UA: NEGATIVE
Blood, UA: NEGATIVE
Glucose, UA: NEGATIVE
Ketones, UA: NEGATIVE
Leukocytes, UA: NEGATIVE
Nitrite, UA: NEGATIVE

## 2012-12-04 LAB — CBC
MCV: 75.3 fL — ABNORMAL LOW (ref 78.0–100.0)
Platelets: 348 10*3/uL (ref 150–400)
RBC: 3.92 MIL/uL (ref 3.87–5.11)
WBC: 5 10*3/uL (ref 4.0–10.5)

## 2012-12-04 LAB — IBC PANEL
%SAT: 9 % — ABNORMAL LOW (ref 20–55)
TIBC: 348 ug/dL (ref 250–470)

## 2012-12-04 LAB — IRON: Iron: 32 ug/dL — ABNORMAL LOW (ref 42–145)

## 2012-12-04 LAB — HEMOGLOBIN, FINGERSTICK: Hemoglobin, fingerstick: 9.5 g/dL — ABNORMAL LOW (ref 12.0–16.0)

## 2012-12-04 MED ORDER — FUSION PLUS PO CAPS: 1.0000 | ORAL_CAPSULE | Freq: Every day | ORAL | Status: DC

## 2012-12-04 NOTE — Patient Instructions (Addendum)

## 2012-12-04 NOTE — Progress Notes (Signed)
47 y.o. G3P0030Single African AmericanF here for annual exam. Periods normal, no issues, just heavy first day only. No partner change, but desires STD screening. Feels like fibroids have not changed, no change in symptoms. Hypertension stable on medication with PCP management. Labs usually done with PCP. Requested Hgb.today due to increased fatigue, and craving ice again. History of anemia in the past.  No other health issues today. Patient asked that we check ears for wax "feel like I am on an airplane at times", no other symptoms. Denies pain or vertigo.  Patient's last menstrual period was 11/21/2012.          Sexually active: yes  The current method of family planning is none.  Long history of fatigue  Exercising: yes  Home exercise routine includes stretching. 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 She has never used smokeless tobacco. She reports that she drinks about 0.5 ounces of alcohol per week. She reports that she does not use illicit drugs.  Past Medical History  Diagnosis Date  . Hypertension   . Pulmonary embolism 2008    secondary to OCP  . Abnormal Pap smear 7/13    ASCUS/positive HR HPV, negative colpo  . Anemia 2006  . Fibroids     with enlarged uterus    Past Surgical History  Procedure Laterality Date  . Myomectomy    . Oophorectomy      left  . Left finger reattachment  age 43    Current Outpatient Prescriptions  Medication Sig Dispense Refill  . hydrochlorothiazide 25 MG tablet Take 25 mg by mouth daily.        . IRON PO Take by mouth daily.      . Pediatric Multivit-Minerals-C (GUMMI BEAR MULTIVITAMIN/MIN PO) Take 2 tablets by mouth daily.        . valsartan (DIOVAN) 80 MG tablet Take 80 mg by mouth daily.          No current facility-administered medications for this visit.    Family History  Problem Relation Age of Onset  . Diabetes Mother   . Hypertension Mother   . Heart disease Mother     cardiomegaly  . Lupus Mother   . Diabetes Father   . Hypertension Father   . Stroke Father   . Hypertension Maternal Grandmother   . Stroke Maternal Grandmother     ROS:  Pertinent items are noted in HPI.  Otherwise, a comprehensive ROS was negative.  Exam:   BP 118/70  Pulse 78  Resp 20  Ht 5\' 4"  (1.626 m)  Wt 197 lb (89.359 kg)  BMI 33.8 kg/m2  LMP 11/21/2012  Weight change: @WEIGHTCHANGE @ Height:   Height: 5\' 4"  (162.6 cm)  Ht Readings from Last 3 Encounters:  12/04/12 5\' 4"  (1.626 m)  11/03/12 5' 4.5" (1.638 m)  09/30/11 5\' 5"  (1.651 m)    General appearance: alert, cooperative and appears stated age Head: Normocephalic, without obvious abnormality, atraumatic Ears: canals clean bilateral, very slight membrane distention in left ear only, no redness bilateral Neck: no adenopathy, supple, symmetrical, trachea midline and thyroid normal to inspection and palpation Lungs: clear to auscultation bilaterally Breasts: normal appearance, no masses or tenderness, No nipple retraction or dimpling, No nipple discharge or  bleeding, No axillary or supraclavicular adenopathy Heart: regular rate and rhythm Abdomen: soft, non-tender; bowel sounds normal; no masses,  no organomegaly Extremities: extremities normal, atraumatic, no cyanosis or edema Skin: Skin color, texture, turgor normal. No rashes or lesions Lymph nodes: Cervical, supraclavicular, and axillary nodes normal. No abnormal inguinal nodes palpated Neurologic: Grossly normal   Pelvic: External genitalia:  no lesions              Urethra:  normal appearing urethra with no masses, tenderness or lesions              Bartholins and Skenes: normal                 Vagina: normal appearing vagina with normal color and discharge, no  lesions              Cervix: normal, non tender              Pap taken: yes Bimanual Exam:  Uterus:  enlarged, 14-16 weeks size, firm and nodular in lower uterine segment.  Consistent with known history of numerous uterine fibroids per [place] 2012(here)              Adnexa: no mass, fullness, tenderness and difficult to palpate due to enlarged uterus               Rectovaginal: Confirms               Anus:  normal sphincter tone, no lesions  A:  Well Woman with normal exam  Contraception: history of infertility  Enlarged uterus with known history of fibroids, no essential size change, asymptomatic per patient  STD screening  Anemia  History of ASCUS pap with +HPVHR, negative colpo  Hypertension stable medication with PCP management  Ear canal clean no wax present   P:  Reviewed health and wellness pertinent to exam  Discussed need to notify if fibroids become symptomatic with heavy bleeding, pressure or pain. Patient aware, feels there is no change.  Lab:GC,Chlamydia,HIV,RPR  Reviewed findings of anemia again. Patient encouraged to increase iron foods in diet again.  Rx Fusion Plus see order Will let patient know when recheck needed, after labs in  Lab: CBC,Iron,IBC panel  Continue follow up as indicated, and patient will see PCP if ear issue continues.   Mammogram yearly, due now, patient to schedule pap smear taken today with HPVHR, if negative repeat in one year, if not as indicated.  counseled on breast self exam, mammography screening, STD prevention, adequate intake of calcium and vitamin D, diet and exercise return annually or prn  An After Visit Summary was printed and given to the patient.

## 2012-12-05 ENCOUNTER — Other Ambulatory Visit: Payer: Self-pay | Admitting: Certified Nurse Midwife

## 2012-12-05 DIAGNOSIS — D649 Anemia, unspecified: Secondary | ICD-10-CM

## 2012-12-12 NOTE — Progress Notes (Signed)
Note reviewed, agree with plan.  Hamdi Kley, MD  

## 2012-12-25 DIAGNOSIS — D509 Iron deficiency anemia, unspecified: Secondary | ICD-10-CM | POA: Insufficient documentation

## 2012-12-27 ENCOUNTER — Telehealth: Payer: Self-pay | Admitting: Certified Nurse Midwife

## 2012-12-27 NOTE — Telephone Encounter (Signed)
Patient call returned. Last AEX with [name] for AEX 12/04/2012 Patient calling for information of being referred to Fertility specialist.  Chart in your office.

## 2012-12-27 NOTE — Telephone Encounter (Signed)
Wants to set up an appointment for consult on fertility .

## 2012-12-28 NOTE — Telephone Encounter (Signed)
Patient notified of fertility specialist  From D.Leonard. Patient given name of fertility specialist [facility] , Dr. Morrison Old. Patient appreciative of information and appreciates our quick response and will call to schedule appt.

## 2012-12-28 NOTE — Telephone Encounter (Signed)
She can be referred, but she can also call to schedule without referral to [facility], Dr.Jeffrey Deaton

## 2013-01-08 ENCOUNTER — Other Ambulatory Visit (INDEPENDENT_AMBULATORY_CARE_PROVIDER_SITE_OTHER): Payer: BC Managed Care – PPO

## 2013-01-08 DIAGNOSIS — D649 Anemia, unspecified: Secondary | ICD-10-CM

## 2013-01-08 LAB — CBC
MCH: 24.8 pg — ABNORMAL LOW (ref 26.0–34.0)
MCHC: 31.8 g/dL (ref 30.0–36.0)
MCV: 77.9 fL — ABNORMAL LOW (ref 78.0–100.0)
Platelets: 300 10*3/uL (ref 150–400)
RDW: 18.3 % — ABNORMAL HIGH (ref 11.5–15.5)

## 2013-01-09 ENCOUNTER — Other Ambulatory Visit: Payer: Self-pay | Admitting: Certified Nurse Midwife

## 2013-01-09 DIAGNOSIS — D509 Iron deficiency anemia, unspecified: Secondary | ICD-10-CM

## 2013-01-12 ENCOUNTER — Telehealth: Payer: Self-pay

## 2013-01-12 NOTE — Telephone Encounter (Signed)
Message copied by Eliezer Bottom on Fri Jan 12, 2013  9:24 AM ------      Message from: Verner Chol      Created: Tue Jan 09, 2013  3:15 PM       Notify patient that  Hgb. Better from 9.3 to 10.3      Ferritin from 9 to 16 which is a normal results      Iron from 32 to 34 (normal 42-145)      Iron saturation level from 9 to 11 ( normal 22- 55%)      Continue to work iron foods in diet and Vitamin C sources to help with absorption      Continue Fusion plus one daily, recheck in 6 weeks, order in , please schedule patient ------

## 2013-01-12 NOTE — Telephone Encounter (Signed)
[email or web address]

## 2013-01-12 NOTE — Telephone Encounter (Signed)
Patient notified of results.

## 2013-01-24 ENCOUNTER — Emergency Department (HOSPITAL_BASED_OUTPATIENT_CLINIC_OR_DEPARTMENT_OTHER)
Admission: EM | Admit: 2013-01-24 | Discharge: 2013-01-25 | Disposition: A | Discharge status: Home / Self Care | Payer: BC Managed Care – PPO | Attending: Emergency Medicine | Admitting: Emergency Medicine

## 2013-01-24 ENCOUNTER — Encounter (HOSPITAL_BASED_OUTPATIENT_CLINIC_OR_DEPARTMENT_OTHER): Payer: Self-pay | Admitting: Emergency Medicine

## 2013-01-24 ENCOUNTER — Emergency Department (HOSPITAL_BASED_OUTPATIENT_CLINIC_OR_DEPARTMENT_OTHER): Payer: BC Managed Care – PPO

## 2013-01-24 DIAGNOSIS — R42 Dizziness and giddiness: Secondary | ICD-10-CM | POA: Insufficient documentation

## 2013-01-24 DIAGNOSIS — Z8742 Personal history of other diseases of the female genital tract: Secondary | ICD-10-CM | POA: Insufficient documentation

## 2013-01-24 DIAGNOSIS — I10 Essential (primary) hypertension: Secondary | ICD-10-CM | POA: Insufficient documentation

## 2013-01-24 DIAGNOSIS — Z86718 Personal history of other venous thrombosis and embolism: Secondary | ICD-10-CM | POA: Insufficient documentation

## 2013-01-24 DIAGNOSIS — D649 Anemia, unspecified: Secondary | ICD-10-CM | POA: Insufficient documentation

## 2013-01-24 DIAGNOSIS — Z79899 Other long term (current) drug therapy: Secondary | ICD-10-CM | POA: Insufficient documentation

## 2013-01-24 DIAGNOSIS — E876 Hypokalemia: Secondary | ICD-10-CM | POA: Insufficient documentation

## 2013-01-24 LAB — URINALYSIS, ROUTINE W REFLEX MICROSCOPIC
Bilirubin Urine: NEGATIVE
Ketones, ur: NEGATIVE mg/dL
Leukocytes, UA: NEGATIVE
Nitrite: NEGATIVE
Protein, ur: NEGATIVE mg/dL
Urobilinogen, UA: 0.2 mg/dL (ref 0.0–1.0)
pH: 6 (ref 5.0–8.0)

## 2013-01-24 LAB — BASIC METABOLIC PANEL
Calcium: 9.9 mg/dL (ref 8.4–10.5)
Chloride: 97 mEq/L (ref 96–112)
Creatinine, Ser: 0.7 mg/dL (ref 0.50–1.10)
GFR calc Af Amer: >90 mL/min (ref >90)
GFR calc non Af Amer: >90 mL/min (ref >90)

## 2013-01-24 LAB — CBC WITH DIFFERENTIAL/PLATELET
Basophils Absolute: 0 10*3/uL (ref 0.0–0.1)
Lymphocytes Relative: 34 % (ref 12–46)
Lymphs Abs: 1.9 10*3/uL (ref 0.7–4.0)
Neutro Abs: 3.1 10*3/uL (ref 1.7–7.7)
Neutrophils Relative %: 56 % (ref 43–77)
Platelets: 354 10*3/uL (ref 150–400)
RBC: 3.84 MIL/uL — ABNORMAL LOW (ref 3.87–5.11)
RDW: 16.2 % — ABNORMAL HIGH (ref 11.5–15.5)
WBC: 5.5 10*3/uL (ref 4.0–10.5)

## 2013-01-24 MED ORDER — POTASSIUM CHLORIDE CRYS ER 20 MEQ PO TBCR
40.0000 meq | EXTENDED_RELEASE_TABLET | Freq: Once | ORAL | Status: AC
  Administered 2013-01-24: 40 meq via ORAL
  Filled 2013-01-24: qty 2

## 2013-01-24 MED ORDER — SODIUM CHLORIDE 0.9 % IV BOLUS (SEPSIS)
1000.0000 mL | Freq: Once | INTRAVENOUS | Status: AC
  Administered 2013-01-24: 1000 mL via INTRAVENOUS

## 2013-01-24 NOTE — ED Provider Notes (Signed)
CSN: [id number]     Arrival date & time 01/24/13  2133 History  This chart was scribed for Charles B. Bernette Mayers, MD by Blanchard Kelch, ED Scribe. The patient was seen in room MH06/MH06. Patient's care was started at 9:47 PM.    Chief Complaint  Patient presents with  . Dizziness    The history is provided by the patient. No language interpreter was used.    HPI Comments: [name] is a 47 y.o. female who presents to the Emergency Department complaining of intermittent dizziness that began yesterday and worsened today at work. Denies room spinning, states she feels lightheaded at times, but not positional. She complains of associated blurred vision and left sided face tingling and tightness that began about 30 minutes ago. She denies vertigo. Patient has a medical history of hypertension and anemia. She reports currently taking Diovan and hydrochlorothiazide for her hypertension.   PCP is Dr. Mikeal Hawthorne.  Past Medical History  Diagnosis Date  . Hypertension   . Pulmonary embolism 2008    secondary to OCP  . Abnormal Pap smear 7/13    ASCUS/positive HR HPV, negative colpo  . Anemia 2006  . Fibroids     with enlarged uterus   Past Surgical History  Procedure Laterality Date  . Myomectomy    . Oophorectomy      left  . Left finger reattachment  age 59   Family History  Problem Relation Age of Onset  . Diabetes Mother   . Hypertension Mother   . Heart disease Mother     cardiomegaly  . Lupus Mother   . Diabetes Father   . Hypertension Father   . Stroke Father   . Hypertension Maternal Grandmother   . Stroke Maternal Grandmother    History  Substance Use Topics  . Smoking status: Never Smoker   . Smokeless tobacco: Never Used  . Alcohol Use: 0.5 oz/week    1 drink(s) per week     Comment: occ glass of wine   OB History   Grav Para Term Preterm Abortions TAB SAB Ect Mult Living   3 0   3     0     Review of Systems: A complete 10 system review of systems was obtained  and all systems are negative except as noted in the HPI and PMH.    Allergies  Minocycline hcl  Home Medications   Current Outpatient Rx  Name  Route  Sig  Dispense  Refill  . hydrochlorothiazide 25 MG tablet   Oral   Take 25 mg by mouth daily.           . IRON PO   Oral   Take by mouth daily.         . Iron-FA-B Cmp-C-Biot-Probiotic (FUSION PLUS) CAPS   Oral   Take 1 capsule by mouth at bedtime.   30 capsule   2   . Multiple Vitamin (MULTIVITAMIN) capsule   Oral   Take 1 capsule by mouth daily.         . valsartan (DIOVAN) 80 MG tablet   Oral   Take 80 mg by mouth daily.           . Pediatric Multivit-Minerals-C (GUMMI BEAR MULTIVITAMIN/MIN PO)   Oral   Take 2 tablets by mouth daily.            Triage Vitals:BP 156/89  Pulse 92  Temp(Src) 98.8 F (37.1 C) (Oral)  Resp 20  Ht 5'  4" (1.626 m)  Wt 198 lb (89.812 kg)  BMI 33.97 kg/m2  SpO2 98%  Physical Exam  Nursing note and vitals reviewed. Constitutional: She is oriented to person, place, and time. She appears well-developed and well-nourished.  HENT:  Head: Normocephalic and atraumatic.  Eyes: EOM are normal. Pupils are equal, round, and reactive to light.  Neck: Normal range of motion. Neck supple.  Cardiovascular: Normal rate, normal heart sounds and intact distal pulses.   Pulmonary/Chest: Effort normal and breath sounds normal.  Abdominal: Bowel sounds are normal. She exhibits no distension. There is no tenderness.  Musculoskeletal: Normal range of motion. She exhibits no edema and no tenderness.  Neurological: She is alert and oriented to person, place, and time. She has normal strength. She displays normal reflexes. No cranial nerve deficit or sensory deficit. Coordination normal.  Skin: Skin is warm and dry. No rash noted.  Psychiatric: She has a normal mood and affect.    ED Course  Procedures (including critical care time)  DIAGNOSTIC STUDIES: Oxygen Saturation is 98% on room air,  normal by my interpretation.    COORDINATION OF CARE:  9:51 PM - Patient verbalizes understanding and agrees with treatment plan.    Labs Review Labs Reviewed  CBC WITH DIFFERENTIAL - Abnormal; Notable for the following:    RBC 3.84 (*)    Hemoglobin 10.0 (*)    HCT 32.0 (*)    RDW 16.2 (*)    All other components within normal limits  BASIC METABOLIC PANEL - Abnormal; Notable for the following:    Potassium 3.1 (*)    Glucose, Bld 137 (*)    All other components within normal limits  TROPONIN I  URINALYSIS, ROUTINE W REFLEX MICROSCOPIC   Imaging Review Ct Head Wo Contrast  01/24/2013   CLINICAL DATA:  And dizziness. Left facial tingling.  EXAM: CT HEAD WITHOUT CONTRAST  TECHNIQUE: Contiguous axial images were obtained from the base of the skull through the vertex without intravenous contrast.  COMPARISON:  MRI 06/14/2012  FINDINGS: No acute intracranial abnormality. Specifically, no hemorrhage, hydrocephalus, mass lesion, acute infarction, or significant intracranial injury. No acute calvarial abnormality. Visualized paranasal sinuses and mastoids clear. Orbital soft tissues unremarkable.  IMPRESSION: Negative study.   Electronically Signed   By: Charlett Nose M.D.   On: 01/24/2013 22:38    MDM  No diagnosis found.   Date: 01/24/2013  Rate: 79  Rhythm: normal sinus rhythm  QRS Axis: left  Intervals: normal  ST/T Wave abnormalities: normal  Conduction Disutrbances:none  Narrative Interpretation:   Old EKG Reviewed: unchanged  Pt with dizziness and face tingling, but no focal neuro deficits on exam. Doubt this is stroke or TIA. Will check labs and CT. Check orthostatics and give fluid bolus.   11:13 PM K moderately low, likely cause of many of her symptoms. Not orthostatic by BP but HR not documented at that time. Low suspicion for orthostasis.  11:48 PM Pt still pending UA and completion of fluid bolus. Care signed out to Dr. Nicanor Alcon.   I personally performed the  services described in this documentation, which was scribed in my presence. The recorded information has been reviewed and is accurate.      Charles B. Bernette Mayers, MD 01/24/13 [phone]

## 2013-01-24 NOTE — ED Notes (Signed)
Pt c/o dizziness since yesterday. Pt began having tingling in left side of face and blurred vision tonight 30 min PTA

## 2013-01-24 NOTE — ED Notes (Signed)
Pt states dizziness in head started yesterday followed by facial tightness on the left side, blurred vision, and tingling in toes today. Reports dizziness comes and goes. Reports blurred vision has resolved.

## 2013-01-25 ENCOUNTER — Ambulatory Visit (INDEPENDENT_AMBULATORY_CARE_PROVIDER_SITE_OTHER): Payer: BC Managed Care – PPO | Admitting: Certified Nurse Midwife

## 2013-01-25 ENCOUNTER — Encounter: Payer: Self-pay | Admitting: Certified Nurse Midwife

## 2013-01-25 VITALS — BP 118/70 | HR 64 | Resp 16 | Ht 64.25 in | Wt 199.0 lb

## 2013-01-25 DIAGNOSIS — N9489 Other specified conditions associated with female genital organs and menstrual cycle: Secondary | ICD-10-CM

## 2013-01-25 DIAGNOSIS — N907 Vulvar cyst: Secondary | ICD-10-CM

## 2013-01-25 DIAGNOSIS — D509 Iron deficiency anemia, unspecified: Secondary | ICD-10-CM

## 2013-01-25 NOTE — Progress Notes (Signed)
47 y.o. Single African American female G3P0030 here with complaint of "something inside vagina for several days". Denies increase in vaginal discharge,odor, pain from area or exudate from or STD concerns. [facility] 12[facility]. Taking Rx iron as prescribed with follow up scheduled.    O: Healthy WD,WN female Affect: normal, orientation x 3 Skin:warm and dry Pelvic exam:EXTERNAL GENITALIA: normal appearing vulva with no masses, tenderness or lesions, very small sebaceous noted at posterior portion of right vulva, not red, not tender, no exudate, shown area in mirror to patient. VAGINA: no abnormal discharge or lesions  A: Sebaceous cyst right vulva 2-Anemia under treatment with Fusion plus  P: Discussed findings of sebaceous cyst, etiology, and no treatment needed. Instructed to avoid squeezing area, which increases risk of infection or inflammation. Patient can do epsom salt tub bath if desired. Warning signs of infection given and need to advise. 2-Continue Rx as directed and keep follow up lab scheduled.   RV prn and as above

## 2013-01-28 NOTE — Progress Notes (Signed)
Note reviewed, agree with plan.  Haleema Vanderheyden, MD  

## 2013-02-19 ENCOUNTER — Telehealth: Payer: Self-pay | Admitting: Certified Nurse Midwife

## 2013-02-19 NOTE — Telephone Encounter (Signed)
Please advise patient was seen 12/04/12 for AEX rx was sent for #30/2 rf's patient was supposed to have follow up has not scheduled.

## 2013-02-19 NOTE — Telephone Encounter (Signed)
Patient requesting a new RX due to insurance for "fusion iron" mail order please?  [facility] [phone]

## 2013-02-20 NOTE — Telephone Encounter (Signed)
Patient is scheduled for labs 02/21/13 @ 9:30 she says she will wait till labs come in and she will go from there. Patient still has 2 weeks worth of iron left.

## 2013-02-20 NOTE — Telephone Encounter (Signed)
Patient needs follow up to determine if iron Rx is working and if adjustments need to be made

## 2013-02-21 ENCOUNTER — Other Ambulatory Visit: Payer: BC Managed Care – PPO

## 2013-02-21 DIAGNOSIS — D509 Iron deficiency anemia, unspecified: Secondary | ICD-10-CM

## 2013-02-21 LAB — FERRITIN: Ferritin: 11 ng/mL (ref 10–291)

## 2013-02-21 LAB — CBC
MCH: 26.3 pg (ref 26.0–34.0)
MCHC: 32.7 g/dL (ref 30.0–36.0)
MCV: 80.6 fL (ref 78.0–100.0)
Platelets: 318 10*3/uL (ref 150–400)

## 2013-02-22 ENCOUNTER — Other Ambulatory Visit: Payer: Self-pay | Admitting: Certified Nurse Midwife

## 2013-02-22 DIAGNOSIS — D649 Anemia, unspecified: Secondary | ICD-10-CM

## 2013-02-22 MED ORDER — FUSION PLUS PO CAPS: 1.0000 | ORAL_CAPSULE | Freq: Every day | ORAL | Status: DC

## 2013-02-22 NOTE — Progress Notes (Signed)
Can you call into [facility] Fusion plus 1 po daily #30 with 6 refills and I will cancel order at [facility]

## 2013-02-23 ENCOUNTER — Other Ambulatory Visit: Payer: Self-pay | Admitting: Certified Nurse Midwife

## 2013-02-23 DIAGNOSIS — D649 Anemia, unspecified: Secondary | ICD-10-CM

## 2013-02-23 MED ORDER — FUSION PLUS PO CAPS: 1.0000 | ORAL_CAPSULE | Freq: Every day | ORAL | Status: DC

## 2013-03-23 ENCOUNTER — Emergency Department (HOSPITAL_BASED_OUTPATIENT_CLINIC_OR_DEPARTMENT_OTHER): Payer: BC Managed Care – PPO

## 2013-03-23 ENCOUNTER — Emergency Department (HOSPITAL_BASED_OUTPATIENT_CLINIC_OR_DEPARTMENT_OTHER)
Admission: EM | Admit: 2013-03-23 | Discharge: 2013-03-24 | Disposition: A | Discharge status: Home / Self Care | Payer: BC Managed Care – PPO | Attending: Emergency Medicine | Admitting: Emergency Medicine

## 2013-03-23 ENCOUNTER — Encounter (HOSPITAL_BASED_OUTPATIENT_CLINIC_OR_DEPARTMENT_OTHER): Payer: Self-pay | Admitting: Emergency Medicine

## 2013-03-23 DIAGNOSIS — Z8742 Personal history of other diseases of the female genital tract: Secondary | ICD-10-CM | POA: Insufficient documentation

## 2013-03-23 DIAGNOSIS — Z86711 Personal history of pulmonary embolism: Secondary | ICD-10-CM | POA: Insufficient documentation

## 2013-03-23 DIAGNOSIS — K219 Gastro-esophageal reflux disease without esophagitis: Secondary | ICD-10-CM | POA: Insufficient documentation

## 2013-03-23 DIAGNOSIS — Z79899 Other long term (current) drug therapy: Secondary | ICD-10-CM | POA: Insufficient documentation

## 2013-03-23 DIAGNOSIS — I10 Essential (primary) hypertension: Secondary | ICD-10-CM | POA: Insufficient documentation

## 2013-03-23 DIAGNOSIS — D649 Anemia, unspecified: Secondary | ICD-10-CM | POA: Insufficient documentation

## 2013-03-23 LAB — CBC WITH DIFFERENTIAL/PLATELET
Basophils Relative: 0 % (ref 0–1)
Eosinophils Absolute: 0.1 10*3/uL (ref 0.0–0.7)
Eosinophils Relative: 2 % (ref 0–5)
HCT: 33 % — ABNORMAL LOW (ref 36.0–46.0)
Hemoglobin: 10.4 g/dL — ABNORMAL LOW (ref 12.0–15.0)
Lymphocytes Relative: 43 % (ref 12–46)
MCH: 27.4 pg (ref 26.0–34.0)
MCHC: 31.5 g/dL (ref 30.0–36.0)
Monocytes Absolute: 0.5 10*3/uL (ref 0.1–1.0)
Neutro Abs: 2.9 10*3/uL (ref 1.7–7.7)
Neutrophils Relative %: 46 % (ref 43–77)
RBC: 3.79 MIL/uL — ABNORMAL LOW (ref 3.87–5.11)

## 2013-03-23 LAB — BASIC METABOLIC PANEL
BUN: 12 mg/dL (ref 6–23)
Chloride: 100 mEq/L (ref 96–112)
Creatinine, Ser: 0.7 mg/dL (ref 0.50–1.10)
GFR calc Af Amer: >90 mL/min (ref >90)
Glucose, Bld: 118 mg/dL — ABNORMAL HIGH (ref 70–99)
Potassium: 3.5 mEq/L (ref 3.5–5.1)

## 2013-03-23 MED ORDER — IOHEXOL 350 MG/ML SOLN
80.0000 mL | Freq: Once | INTRAVENOUS | Status: AC | PRN
  Administered 2013-03-23: 80 mL via INTRAVENOUS

## 2013-03-23 MED ORDER — GI COCKTAIL ~~LOC~~
30.0000 mL | Freq: Once | ORAL | Status: AC
  Administered 2013-03-23: 30 mL via ORAL
  Filled 2013-03-23: qty 30

## 2013-03-23 MED ORDER — NITROGLYCERIN 0.4 MG SL SUBL
0.4000 mg | SUBLINGUAL_TABLET | SUBLINGUAL | Status: DC | PRN
  Administered 2013-03-23: 0.4 mg via SUBLINGUAL
  Filled 2013-03-23 (×2): qty 25

## 2013-03-23 NOTE — ED Provider Notes (Signed)
CSN: [id number]     Arrival date & time 03/23/13  2127 History  This chart was scribed for Rolan Bucco, MD by Danella Maiers, ED Scribe. This patient was seen in room MH05/MH05 and the patient's care was started at 9:38 PM.   Chief Complaint  Patient presents with  . Chest Pain   Patient is a 47 y.o. female presenting with chest pain. The history is provided by the patient. No language interpreter was used.  Chest Pain Pain location:  L chest and epigastric Pain quality: burning   Pain radiates to:  L shoulder Pain radiates to the back: no   Duration:  2 days Associated symptoms: no abdominal pain, no back pain, no cough, no diaphoresis, no dizziness, no fatigue, no fever, no headache, no nausea, no numbness, no shortness of breath, not vomiting and no weakness    HPI Comments: [name] is a 47 y.o. female with a h/o hypertension and PE in 2008 who presents to the Emergency Department complaining of middle CP described as a burning sensation onset 20 minutes ago.  The pain began shortly after eating [employer] and laying down,became a pulling sensation in addition to the burning, and started to radiate to her left chest and left shoulder. The pain does not worsen with deep breaths, walking, activity. She reports prior episodes of similar burning CP while at work, but not after a meal. She took an 81 aspirin PTA. She denies abdominal pain, SOB, nausea, vomiting, diaphoresis, leg swelling. She denies h/o heart problems, MI. She had a negative stress test in January or February of this year with Dr. Marny Lowenstein.    Past Medical History  Diagnosis Date  . Hypertension   . Pulmonary embolism 2008    secondary to OCP  . Abnormal Pap smear 7/13    ASCUS/positive HR HPV, negative colpo  . Anemia 2006  . Fibroids     with enlarged uterus   Past Surgical History  Procedure Laterality Date  . Myomectomy    . Oophorectomy      left  . Left finger reattachment  age 36   Family History   Problem Relation Age of Onset  . Diabetes Mother   . Hypertension Mother   . Heart disease Mother     cardiomegaly  . Lupus Mother   . Diabetes Father   . Hypertension Father   . Stroke Father   . Hypertension Maternal Grandmother   . Stroke Maternal Grandmother    History  Substance Use Topics  . Smoking status: Never Smoker   . Smokeless tobacco: Never Used  . Alcohol Use: 0.5 oz/week    1 drink(s) per week     Comment: occ glass of wine   OB History   Grav Para Term Preterm Abortions TAB SAB Ect Mult Living   3 0   3     0     Review of Systems  Constitutional: Negative for fever, chills, diaphoresis and fatigue.  HENT: Negative for congestion, rhinorrhea and sneezing.   Eyes: Negative.   Respiratory: Negative for cough, chest tightness and shortness of breath.   Cardiovascular: Positive for chest pain. Negative for leg swelling.  Gastrointestinal: Negative for nausea, vomiting, abdominal pain, diarrhea and blood in stool.  Genitourinary: Negative for frequency, hematuria, flank pain and difficulty urinating.  Musculoskeletal: Negative for arthralgias and back pain.  Skin: Negative for rash.  Neurological: Negative for dizziness, speech difficulty, weakness, numbness and headaches.    Allergies  Minocycline  hcl  Home Medications   Current Outpatient Rx  Name  Route  Sig  Dispense  Refill  . hydrochlorothiazide 25 MG tablet   Oral   Take 25 mg by mouth daily.           . Iron-FA-B Cmp-C-Biot-Probiotic (FUSION PLUS) CAPS   Oral   Take 1 capsule by mouth daily.   90 capsule   4   . Pediatric Multivit-Minerals-C (GUMMI BEAR MULTIVITAMIN/MIN PO)   Oral   Take 2 tablets by mouth daily.           . valsartan (DIOVAN) 320 MG tablet   Oral   Take 320 mg by mouth daily.          BP 158/87  Temp(Src) 98.7 F (37.1 C) (Oral)  Resp 20  Ht 5\' 4"  (1.626 m)  Wt 198 lb (89.812 kg)  BMI 33.97 kg/m2  SpO2 98%  LMP 03/21/2013 Physical Exam  Nursing  note and vitals reviewed. Constitutional: She is oriented to person, place, and time. She appears well-developed and well-nourished.  HENT:  Head: Normocephalic and atraumatic.  Eyes: Pupils are equal, round, and reactive to light.  Neck: Normal range of motion. Neck supple.  Cardiovascular: Normal rate, regular rhythm and normal heart sounds.   Pulmonary/Chest: Effort normal and breath sounds normal. No respiratory distress. She has no wheezes. She has no rales. She exhibits no tenderness.  No chest tenderness  Abdominal: Soft. Bowel sounds are normal. There is no tenderness. There is no rebound and no guarding.  Musculoskeletal: Normal range of motion. She exhibits no edema.  No calf tenderness  Lymphadenopathy:    She has no cervical adenopathy.  Neurological: She is alert and oriented to person, place, and time.  Skin: Skin is warm and dry. No rash noted.  Psychiatric: She has a normal mood and affect.    ED Course  Procedures (including critical care time) Medications  nitroGLYCERIN (NITROSTAT) SL tablet 0.4 mg (0.4 mg Sublingual Given 03/23/13 2313)  gi cocktail (Maalox,Lidocaine,Donnatal) (30 mLs Oral Given 03/23/13 2157)  iohexol (OMNIPAQUE) 350 MG/ML injection 80 mL (80 mLs Intravenous Contrast Given 03/23/13 2343)    DIAGNOSTIC STUDIES: Oxygen Saturation is 98% on RA, normal by my interpretation.    COORDINATION OF CARE: 9:50 PM- Discussed treatment plan with pt which includes CXR, blood work. Pt agrees to plan.    Labs Review Labs Reviewed  CBC WITH DIFFERENTIAL - Abnormal; Notable for the following:    RBC 3.79 (*)    Hemoglobin 10.4 (*)    HCT 33.0 (*)    All other components within normal limits  BASIC METABOLIC PANEL - Abnormal; Notable for the following:    Glucose, Bld 118 (*)    All other components within normal limits  D-DIMER, QUANTITATIVE - Abnormal; Notable for the following:    D-Dimer, Quant 0.57 (*)    All other components within normal limits   TROPONIN I  TROPONIN I   Imaging Review Dg Chest 2 View  03/23/2013   CLINICAL DATA:  Chest pain and burning sensation.  EXAM: CHEST  2 VIEW  COMPARISON:  Chest radiograph performed 10/12/2011, and CTA of the chest performed 06/22/2010  FINDINGS: The heart size and mediastinal contours are within normal limits. Both lungs are clear. The visualized skeletal structures are unremarkable.  IMPRESSION: No active cardiopulmonary disease.   Electronically Signed   By: Roanna Raider M.D.   On: 03/23/2013 22:35    EKG Interpretation  Ventricular Rate:  91 PR Interval:  164 QRS Duration: 86 QT Interval:  390 QTC Calculation: 479 R Axis:   -36 Text Interpretation:  Normal sinus rhythm Left axis deviation Cannot rule out Anterior infarct , age undetermined Abnormal ECG since last tracing no significant change            MDM  No diagnosis found. Pt with hx of HTN, PE with CP, sounds more GI.  No other associated symptoms, nonexertional.  No ischemic changes on EKG.  Trop neg.  Had recent negative stress test per pt.  Will check CTA chest, repeat troponin, repeat EKG, likely discharge if normal and pt pain free.  Dr. Nicanor Alcon to take over and follow up on repeat studies.     I personally performed the services described in this documentation, which was scribed in my presence.  The recorded information has been reviewed and considered.    Rolan Bucco, MD 03/23/13 [phone]

## 2013-03-23 NOTE — ED Notes (Signed)
Pt back to room.

## 2013-03-23 NOTE — ED Notes (Signed)
Pt reports feeling "burning sensation" x 2 days in central chest. Today began having pain radiating to L chest and shoulder. Denies shortness of breath, N/V or diaphoresis

## 2013-03-23 NOTE — ED Notes (Signed)
Patient transported to X-ray 

## 2013-03-23 NOTE — ED Notes (Signed)
CT notified pt was ready and IV flushes without difficulty

## 2013-03-24 LAB — TROPONIN I: Troponin I: <0.3 ng/mL (ref <0.30)

## 2013-03-24 MED ORDER — OMEPRAZOLE 20 MG PO CPDR: 20.0000 mg | DELAYED_RELEASE_CAPSULE | Freq: Every day | ORAL | Status: DC

## 2013-04-05 ENCOUNTER — Telehealth: Payer: Self-pay | Admitting: Certified Nurse Midwife

## 2013-04-05 NOTE — Telephone Encounter (Signed)
Patient calling re: "Bacterial infection is back and last RX did not seem to work?" Patient declined an appointment as she wants to speak with the nurse first.  [name] on [employer]

## 2013-04-05 NOTE — Telephone Encounter (Signed)
Message left to return call to Ayaz Sondgeroth at [phone].    

## 2013-04-05 NOTE — Telephone Encounter (Signed)
Patient returned call. She is having vaginal itching, vaginal irritation and "white cottage cheese discharge" with no odor.  Used remainder of Flagyl "a few weeks ago" and did not have any relief. Patient feels it may be yeast.  Offered OV.  Patient declined OV. Reccommended  Monistat 3 (OTC) and Hydrocortisone ointment for external irritation. Advised to keep area clean and dry. Change Damp clothes as soon as possible. White cotton underwear is best. Can also try Aveeno Oatmeal Sitz Baths for relief of external irritation. Patient will try a 3 day treatment and then call back with results and OV if needed.    Routing to provider for final review. Patient agreeable to disposition. Will close encounter

## 2013-04-23 ENCOUNTER — Telehealth: Payer: Self-pay | Admitting: Emergency Medicine

## 2013-04-23 ENCOUNTER — Other Ambulatory Visit: Payer: BC Managed Care – PPO

## 2013-04-23 DIAGNOSIS — D649 Anemia, unspecified: Secondary | ICD-10-CM

## 2013-04-23 NOTE — Telephone Encounter (Signed)
Pt missed lab appointment here at office and will have done at [facility] labs.

## 2013-04-23 NOTE — Telephone Encounter (Signed)
Labs released for patient to have drawn.

## 2013-05-04 ENCOUNTER — Telehealth: Payer: Self-pay | Admitting: Certified Nurse Midwife

## 2013-05-04 NOTE — Telephone Encounter (Signed)
Spoke with pt who is having yeast symptoms again, itch and discharge like she had before. Pt used Monistat after last call and symptoms went away for about 2 weeks. Pt wondering what she can do to prevent them. Advised pt to drink more water and avoid sugary drinks and sweets. Advised pt to eat yogurt with live active cultures. Advised she can try Aveeno oatmeal sitz bath to help with discomfort. Pt to wear all cotton underwear and try wearing no underwear at night while she sleeps. Pt admits she needs to drink more water, and she hasn't been eating yogurt lately like she used to do. Offered pt OV, but she is preparing to take her father back to [place] and can't come in until 05-08-13. Sched OV with DL [id number] at 4:03 per pt request. Pt will try suggestions and Monistat if needed.

## 2013-05-04 NOTE — Telephone Encounter (Signed)
Pt is still having issues would like to talk with the nurse she would like to come in for a visit.

## 2013-05-08 ENCOUNTER — Ambulatory Visit (INDEPENDENT_AMBULATORY_CARE_PROVIDER_SITE_OTHER): Payer: BC Managed Care – PPO | Admitting: Certified Nurse Midwife

## 2013-05-08 ENCOUNTER — Encounter: Payer: Self-pay | Admitting: Certified Nurse Midwife

## 2013-05-08 VITALS — BP 112/66 | HR 68 | Resp 16 | Ht 64.25 in | Wt 204.0 lb

## 2013-05-08 DIAGNOSIS — N760 Acute vaginitis: Secondary | ICD-10-CM

## 2013-05-08 DIAGNOSIS — N912 Amenorrhea, unspecified: Secondary | ICD-10-CM

## 2013-05-08 MED ORDER — METRONIDAZOLE 500 MG PO TABS: 500.0000 mg | ORAL_TABLET | Freq: Two times a day (BID) | ORAL | Status: DC

## 2013-05-08 NOTE — Progress Notes (Signed)
47 y.o.MarriedAfrican American female [phone] with a 6 day(s) history of the following:discharge described as clear and milky and vulvar itching Sexually active: yes Last sexual activity:7days ago. Pt also reports the following associated symptoms: noted discharge at that time. Patient has tried over the counter treatment with no relief. Patient denies any new personal products, or STD concerns or testing. Patient used Monistat 1 day and had severe irritation from use. No other health issues. History of large fibroids no change in symptoms. Contraception none. History of infertility with fibroids.  O: Healthy female WD WN Affect: Normal, orientation x 3 POCT UPT negative    Exam:  JYN:WGNFAO, Bartholin's, Urethra, Skene's normal                ZHY:QMVHQIONG: clear, watery and malodorous, pH 5.5, wet prep done                Cx:  normal appearance and non tender                Uterus:non-tender, size consistent with 16 weeks                Adnexa: no mass, fullness, tenderness  Wet Prep shows:clue cells  A:Bacterial vaginosis  P:Discussed findings of BV and no yeast. Patient was taking antibiotic from dermatology for facial issues, which may have contributed to her yeast symptoms. Rx Flagyl see order  Symptomatic local care discussed. Aveeno sitz bath discussed for comfort. Questions answered.   RV prn

## 2013-05-09 NOTE — Progress Notes (Signed)
Reviewed personally.  M. Suzanne Mickaela Starlin, MD.  

## 2013-05-30 ENCOUNTER — Other Ambulatory Visit: Payer: Self-pay

## 2013-05-30 ENCOUNTER — Ambulatory Visit (INDEPENDENT_AMBULATORY_CARE_PROVIDER_SITE_OTHER): Payer: BC Managed Care – PPO | Admitting: Certified Nurse Midwife

## 2013-05-30 VITALS — BP 120/80 | HR 70 | Temp 98.1°F | Resp 16 | Ht 64.25 in | Wt 204.0 lb

## 2013-05-30 DIAGNOSIS — A630 Anogenital (venereal) warts: Secondary | ICD-10-CM

## 2013-05-30 DIAGNOSIS — Z1231 Encounter for screening mammogram for malignant neoplasm of breast: Secondary | ICD-10-CM

## 2013-05-30 DIAGNOSIS — N390 Urinary tract infection, site not specified: Secondary | ICD-10-CM

## 2013-05-30 DIAGNOSIS — N898 Other specified noninflammatory disorders of vagina: Secondary | ICD-10-CM

## 2013-05-30 DIAGNOSIS — D509 Iron deficiency anemia, unspecified: Secondary | ICD-10-CM

## 2013-05-30 LAB — POCT URINALYSIS DIPSTICK
Bilirubin, UA: NEGATIVE
Blood, UA: NEGATIVE
Glucose, UA: NEGATIVE
KETONES UA: NEGATIVE
Nitrite, UA: NEGATIVE
PROTEIN UA: NEGATIVE
UROBILINOGEN UA: NEGATIVE
pH, UA: 5

## 2013-05-30 LAB — CBC
HCT: 31.9 % — ABNORMAL LOW (ref 36.0–46.0)
Hemoglobin: 10.3 g/dL — ABNORMAL LOW (ref 12.0–15.0)
MCH: 27 pg (ref 26.0–34.0)
MCHC: 32.3 g/dL (ref 30.0–36.0)
MCV: 83.7 fL (ref 78.0–100.0)
Platelets: 381 10*3/uL (ref 150–400)
RBC: 3.81 MIL/uL — ABNORMAL LOW (ref 3.87–5.11)
RDW: 15 % (ref 11.5–15.5)
WBC: 6.7 10*3/uL (ref 4.0–10.5)

## 2013-05-30 LAB — IRON: Iron: 27 ug/dL — ABNORMAL LOW (ref 42–145)

## 2013-05-30 MED ORDER — NITROFURANTOIN MONOHYD MACRO 100 MG PO CAPS: 100.0000 mg | ORAL_CAPSULE | Freq: Two times a day (BID) | ORAL | Status: DC

## 2013-05-30 NOTE — Telephone Encounter (Signed)
Patient thinks she may have a "urinary tract infection" and wants to speak with nurse. She is willing to come in for a visit today if we can work her into the schedule.

## 2013-05-30 NOTE — Telephone Encounter (Signed)
Patient states that since last week, she has noticed urinary odor "Smells like ammonia"  And urinary cloudiness. She has increased fluids without improvement. Denies fevers or flank pain. Requesting appointment today, as she cannot tomorrow due to prior engagements. Spoke with Dr. Charlies Constable, okay for patient to come now, spoke with patient and appointment scheduled.

## 2013-05-30 NOTE — Progress Notes (Deleted)
S:  @48  y.o.Married [place] American female presents with UTI , symptoms of {Symptoms; uti female:13879}. Onset of symptoms for. Pertinent negatives include {Constitutional:20540}.  Symptoms related to post coital {yes/no:20286} Current method of birth control {CCO Contraception:21020264}. Same partner {With/Without:20273} change. Last UTI documented  {TIME UNITS:19995}.  ROS: {Ros - constitutional:32435}  O {pe mental status_general use:313008::"alert, oriented to person, place, and time"}   {EXAM; CONSTITUTIONAL:18480}  {EXAM; ABDOMINAL TENDERNESS:18518}  {Exam; abdomen 2:17754::"No CVA tenderness"}    Diagnostic Test:    Urinalysis {Findings; lab urinalysis micro result:10036}   {PROCEDURES; LAB URINE:30595} Poct urine-wbc 2+  Assessment: {plan; QMG:86761}   Medication Therapy: {Medication dosage:31317}   Lab:TOC if Urine Culture is positive   Plan:     RV

## 2013-05-31 DIAGNOSIS — A630 Anogenital (venereal) warts: Secondary | ICD-10-CM | POA: Insufficient documentation

## 2013-05-31 LAB — FERRITIN: FERRITIN: 7 ng/mL — AB (ref 10–291)

## 2013-05-31 LAB — URINALYSIS, MICROSCOPIC ONLY
CRYSTALS: NONE SEEN
Casts: NONE SEEN
Squamous Epithelial / LPF: NONE SEEN

## 2013-05-31 NOTE — Progress Notes (Signed)
   Subjective:    Patient ID: [name]  Married african [place], female [id number]  DOB: [birth year], 48 y.o.   MRN: [id number]  Urinary Tract Infection  The current episode started in the past 7 days. The problem occurs intermittently. The problem has been gradually worsening. The quality of the pain is described as burning. The pain is mild. There has been no fever. She is sexually active. Associated symptoms include urgency. Pertinent negatives include no hematuria or nausea. She has tried increased fluids for the symptoms. The treatment provided mild relief.  The patient was treated for BV 05/08/13 with Flagyl with all symptoms relieved. Patient denies vaginal discharge change but would like check,STD concerns,back pain, or post coital association with UTI symptoms.No new personal products. Patient does drink soda periodically. Patient also was treated for iron deficiency anemia and needs recheck of lab today. Patient has been taking Fusion Plus as directed. Fatigue level much better. No other issues today.   Review of Systems  Constitutional: Negative.   Gastrointestinal: Negative for nausea.  Genitourinary: Positive for urgency. Negative for hematuria, vaginal discharge and difficulty urinating.       Objective:   Physical Exam  Constitutional: She is oriented to person, place, and time. She appears well-developed and well-nourished.  Abdominal: There is no tenderness.  Genitourinary: Rectum normal.    There is lesion on the right labia. There is lesion on the left labia. Uterus is enlarged. Cervix exhibits no motion tenderness. Right adnexum displays no mass and no tenderness. Left adnexum displays no mass and no tenderness. No tenderness around the vagina. Vaginal discharge found.  Lymphadenopathy:       Right: No inguinal adenopathy present.       Left: No inguinal adenopathy present.  Neurological: She is alert and oriented to person, place, and time.  Skin: Skin is warm and  dry.  Psychiatric: She has a normal mood and affect. Judgment normal.  Bladder, Urethra, and urethral meatus all tender. Wet Prep taken, vaginal ph 4.0 Wet prep negative for yeast, BV,Trich      Assessment & Plan:  1-UTI 2-Known enlarged uterus 14-16 week size, history of fibroids, no size change since last aex 2014. 3-Genital Condyloma, patient history of abnormal pap with HPVHR 4-Anemia on Fusion Plus labs due  P: Discussed findings consistent with UTI. Instructed to continue to increase water intake and decrease soda. Rx Macrobid see order Lab: Urine micro/culture, TOC if culture positive 2-Discussed no change in size, patient aware of warning signs 3- Discussed findings, patient shown areas in mirror. Discussed etiology and relationship to abnormal findings of pap smear related to HPV. Discussed treatment options of TCA, or topical self applied cream. Patient wants to have treatment here, but will be out of town for 3 weeks, will schedule when returns. Discussed sexual transmission and spouse concerns. 4-Continue Rx Fusion plus as prescribed and good diet. Lab:CBC,Iron,Ferritin,TIBC  Rv as above, prn

## 2013-05-31 NOTE — Patient Instructions (Signed)
Genital Warts Genital warts are a sexually transmitted infection. They may appear as small bumps on the tissues of the genital area. CAUSES  Genital warts are caused by a virus called human papillomavirus (HPV). HPV is the most common sexually transmitted disease (STD) and infection of the sex organs. This infection is spread by having unprotected sex with an infected person. It can be spread by vaginal, anal, and oral sex. Many people do not know they are infected. They may be infected for years without problems. However, even if they do not have problems, they can unknowingly pass the infection to their sexual partners. SYMPTOMS   Itching and irritation in the genital area.  Warts that bleed.  Painful sexual intercourse. DIAGNOSIS  Warts are usually recognized with the naked eye on the vagina, vulva, perineum, anus, and rectum. Certain tests can also diagnose genital warts, such as:  A Pap test.  A tissue sample (biopsy) exam.  Colposcopy. A magnifying tool is used to examine the vagina and cervix. The HPV cells will change color when certain solutions are used. TREATMENT  Warts can be removed by:  Applying certain chemicals, such as cantharidin or podophyllin.  Liquid nitrogen freezing (cryotherapy).  Immunotherapy with candida or trichophyton injections.  Laser treatment.  Burning with an electrified probe (electrocautery).  Interferon injections.  Surgery. PREVENTION  HPV vaccination can help prevent HPV infections that cause genital warts and that cause cancer of the cervix. It is recommended that the vaccination be given to people between the ages 9 to 26 years old. The vaccine might not work as well or might not work at all if you already have HPV. It should not be given to pregnant women. HOME CARE INSTRUCTIONS   It is important to follow your caregiver's instructions. The warts will not go away without treatment. Repeat treatments are often needed to get rid of warts.  Even after it appears that the warts are gone, the normal tissue underneath often remains infected.  Do not try to treat genital warts with medicine used to treat hand warts. This type of medicine is strong and can burn the skin in the genital area, causing more damage.  Tell your past and current sexual partner(s) that you have genital warts. They may be infected also and need treatment.  Avoid sexual contact while being treated.  Do not touch or scratch the warts. The infection may spread to other parts of your body.  Women with genital warts should have a cervical cancer check (Pap test) at least once a year. This type of cancer is slow-growing and can be cured if found early. Chances of developing cervical cancer are increased with HPV.  Inform your obstetrician about your warts in the event of pregnancy. This virus can be passed to the baby's respiratory tract. Discuss this with your caregiver.  Use a condom during sexual intercourse. Following treatment, the use of condoms will help prevent reinfection.  Ask your caregiver about using over-the-counter anti-itch creams. SEEK MEDICAL CARE IF:   Your treated skin becomes red, swollen, or painful.  You have a fever.  You feel generally ill.  You feel Rech lumps in and around your genital area.  You are bleeding or have painful sexual intercourse. MAKE SURE YOU:   Understand these instructions.  Will watch your condition.  Will get help right away if you are not doing well or get worse. Document Released: 04/30/2000 Document Revised: 07/26/2011 Document Reviewed: 11/09/2010 ExitCare Patient Information 2014 [employer].  

## 2013-06-01 ENCOUNTER — Encounter: Payer: Self-pay | Admitting: Certified Nurse Midwife

## 2013-06-01 LAB — URINE CULTURE

## 2013-06-01 NOTE — Progress Notes (Signed)
Reviewed personally.  M. Suzanne Pj Zehner, MD.  

## 2013-06-15 ENCOUNTER — Telehealth: Payer: Self-pay | Admitting: *Deleted

## 2013-06-15 ENCOUNTER — Ambulatory Visit: Payer: BC Managed Care – PPO

## 2013-06-15 ENCOUNTER — Telehealth: Payer: Self-pay | Admitting: Certified Nurse Midwife

## 2013-06-15 DIAGNOSIS — N390 Urinary tract infection, site not specified: Secondary | ICD-10-CM

## 2013-06-15 NOTE — Telephone Encounter (Signed)
LM for pt. Regarding appt for this afternoon, to see if she could come in around earlier today.

## 2013-06-15 NOTE — Telephone Encounter (Signed)
Spoke with patient. She will have urine culture collected at another lab, she states she has used this [facility] before. Urine culture entered, verified that it can be seen by another lab office with Caryl Pina here at [facility].

## 2013-06-15 NOTE — Telephone Encounter (Signed)
Patient wants to have order for urine reck sent to [facility] on [address]. Said we have sent orders there before.

## 2013-06-16 LAB — URINE CULTURE
COLONY COUNT: NO GROWTH
ORGANISM ID, BACTERIA: NO GROWTH

## 2013-06-19 ENCOUNTER — Telehealth: Payer: Self-pay

## 2013-06-19 NOTE — Telephone Encounter (Signed)
Message copied by Susy Manor on Tue Jun 19, 2013  3:44 PM ------      Message from: Regina Eck      Created: Sun Jun 17, 2013  3:32 PM       Notify patient urine culture negative ------

## 2013-06-19 NOTE — Telephone Encounter (Signed)
[email or web address]

## 2013-06-20 NOTE — Telephone Encounter (Signed)
Patient notified of results.

## 2013-06-20 NOTE — Telephone Encounter (Signed)
Pt is returning a call to Joy 

## 2013-06-20 NOTE — Telephone Encounter (Signed)
Left message for call back.

## 2013-06-22 ENCOUNTER — Ambulatory Visit
Admission: RE | Admit: 2013-06-22 | Discharge: 2013-06-22 | Disposition: A | Discharge status: Home / Self Care | Payer: Self-pay | Source: Ambulatory Visit

## 2013-06-22 DIAGNOSIS — Z1231 Encounter for screening mammogram for malignant neoplasm of breast: Secondary | ICD-10-CM

## 2013-06-25 ENCOUNTER — Ambulatory Visit (INDEPENDENT_AMBULATORY_CARE_PROVIDER_SITE_OTHER): Payer: BC Managed Care – PPO | Admitting: Certified Nurse Midwife

## 2013-06-25 ENCOUNTER — Encounter: Payer: Self-pay | Admitting: Certified Nurse Midwife

## 2013-06-25 ENCOUNTER — Telehealth: Payer: Self-pay | Admitting: Certified Nurse Midwife

## 2013-06-25 VITALS — BP 122/78 | HR 72 | Resp 16 | Ht 64.25 in | Wt 198.0 lb

## 2013-06-25 DIAGNOSIS — D509 Iron deficiency anemia, unspecified: Secondary | ICD-10-CM

## 2013-06-25 DIAGNOSIS — A630 Anogenital (venereal) warts: Secondary | ICD-10-CM

## 2013-06-25 NOTE — Progress Notes (Signed)
48 y.o. Married [place] American female [phone] here for follow up of E.Coli UTI /anemia treated with Macrobid and Fusion Plus  initiated on 05/30/13. Patient continues on Fusion Plus daily and adding Vitamin C to diet to help with absorption. Completed all other medication as directed.  Denies any symptoms of urinary frequency, urgency, pain or blood in urine. Patient also here for treatment of condyloma in perineal area.    O: Healthy WD,WN female Affect: normal, orientation x 3 Skin:warm and dry Abdomen:suprapubic negative for pain CVAT: negative bilateral Pelvic exam:EXTERNAL GENITALIA: no redness, or scaling. Condyloma noted on lower vulva area on left and right and at the tip of skin covering clitoris. VAGINA: no abnormal discharge or lesions  A:UTI Resolved Iron deficiency anemia on Rx Fusion Plus  Condyloma requesting treatment with TCA    P: Discussed findings of UTI appears resolved by exam and culture negative. Continue to increase daily water intake. Continue on Fusion plus daily, given folic acid handout to increase absorption. Discussed risk and benefit of condyloma treatment with TCA and expectations. Patient acknowledges and request same.  Procedure: Condyloma identified per diagram on vulva. KY jelly applied to surrounding healthy tissue. TCA applied to areas designated with acetowhite appearance noted. Patient tolerated procedure well. Instructions given.       Labs :CBC,Ferritin,Iron, IBC panel to be checked prior to next appointment. Patient going to lab to have collected near her work.   RV 10 days for retreatment of condyloma if indicated

## 2013-06-25 NOTE — Patient Instructions (Signed)
Genital Warts Genital warts are a sexually transmitted infection. They may appear as small bumps on the tissues of the genital area. CAUSES  Genital warts are caused by a virus called human papillomavirus (HPV). HPV is the most common sexually transmitted disease (STD) and infection of the sex organs. This infection is spread by having unprotected sex with an infected person. It can be spread by vaginal, anal, and oral sex. Many people do not know they are infected. They may be infected for years without problems. However, even if they do not have problems, they can unknowingly pass the infection to their sexual partners. SYMPTOMS   Itching and irritation in the genital area.  Warts that bleed.  Painful sexual intercourse. DIAGNOSIS  Warts are usually recognized with the naked eye on the vagina, vulva, perineum, anus, and rectum. Certain tests can also diagnose genital warts, such as:  A Pap test.  A tissue sample (biopsy) exam.  Colposcopy. A magnifying tool is used to examine the vagina and cervix. The HPV cells will change color when certain solutions are used. TREATMENT  Warts can be removed by:  Applying certain chemicals, such as cantharidin or podophyllin.  Liquid nitrogen freezing (cryotherapy).  Immunotherapy with candida or trichophyton injections.  Laser treatment.  Burning with an electrified probe (electrocautery).  Interferon injections.  Surgery. PREVENTION  HPV vaccination can help prevent HPV infections that cause genital warts and that cause cancer of the cervix. It is recommended that the vaccination be given to people between the ages 9 to 26 years old. The vaccine might not work as well or might not work at all if you already have HPV. It should not be given to pregnant women. HOME CARE INSTRUCTIONS   It is important to follow your caregiver's instructions. The warts will not go away without treatment. Repeat treatments are often needed to get rid of warts.  Even after it appears that the warts are gone, the normal tissue underneath often remains infected.  Do not try to treat genital warts with medicine used to treat hand warts. This type of medicine is strong and can burn the skin in the genital area, causing more damage.  Tell your past and current sexual partner(s) that you have genital warts. They may be infected also and need treatment.  Avoid sexual contact while being treated.  Do not touch or scratch the warts. The infection may spread to other parts of your body.  Women with genital warts should have a cervical cancer check (Pap test) at least once a year. This type of cancer is slow-growing and can be cured if found early. Chances of developing cervical cancer are increased with HPV.  Inform your obstetrician about your warts in the event of pregnancy. This virus can be passed to the baby's respiratory tract. Discuss this with your caregiver.  Use a condom during sexual intercourse. Following treatment, the use of condoms will help prevent reinfection.  Ask your caregiver about using over-the-counter anti-itch creams. SEEK MEDICAL CARE IF:   Your treated skin becomes red, swollen, or painful.  You have a fever.  You feel generally ill.  You feel Car[place]an lumps in and around your genital area.  You are bleeding or have painful sexual intercourse. MAKE SURE YOU:   Understand these instructions.  Will watch your condition.  Will get help right away if you are not doing well or get worse. Document Released: 04/30/2000 Document Revised: 07/26/2011 Document Reviewed: 11/09/2010 Exit[employer] Patient Information 2014 [employer].  

## 2013-06-25 NOTE — Telephone Encounter (Signed)
Pt is requesting a order be sent to [facility] on [address] for blood work to be done before her next appointment.

## 2013-07-02 NOTE — Progress Notes (Signed)
Reviewed personally.  M. Suzanne Lenia Housley, MD.  

## 2013-07-04 ENCOUNTER — Other Ambulatory Visit: Payer: Self-pay | Admitting: Certified Nurse Midwife

## 2013-07-04 LAB — IRON AND TIBC
%SAT: 9 % — ABNORMAL LOW (ref 20–55)
Iron: 26 ug/dL — ABNORMAL LOW (ref 42–145)
TIBC: 278 ug/dL (ref 250–470)
UIBC: 252 ug/dL (ref 125–400)

## 2013-07-04 LAB — CBC
HCT: 31.2 % — ABNORMAL LOW (ref 36.0–46.0)
HEMOGLOBIN: 10.3 g/dL — AB (ref 12.0–15.0)
MCH: 27.5 pg (ref 26.0–34.0)
MCHC: 33 g/dL (ref 30.0–36.0)
MCV: 83.2 fL (ref 78.0–100.0)
Platelets: 269 10*3/uL (ref 150–400)
RBC: 3.75 MIL/uL — AB (ref 3.87–5.11)
RDW: 15.4 % (ref 11.5–15.5)
WBC: 4.9 10*3/uL (ref 4.0–10.5)

## 2013-07-05 ENCOUNTER — Telehealth: Payer: Self-pay

## 2013-07-05 LAB — FERRITIN: Ferritin: 10 ng/mL (ref 10–291)

## 2013-07-05 NOTE — Telephone Encounter (Signed)
Returning a call to Joy. °

## 2013-07-05 NOTE — Telephone Encounter (Signed)
Patient notified of results.

## 2013-07-05 NOTE — Telephone Encounter (Signed)
Message copied by Susy Manor on Thu Jul 05, 2013  9:11 AM ------      Message from: Regina Eck      Created: Thu Jul 05, 2013  8:00 AM       Notify patient also that Iron saturation and iron level still low, make sure you are taking iron supplement with no tea or soda, only with orange juice and take at night for better absorption      Hgb. Stable at 10.3 but still low. Patient needs to increase Fusion plus to twice daily during week of period,to maintain level and then can resume once daily      Recheck in 2 months, patient needs to call when she is going to lab so the order can be sent ------

## 2013-07-05 NOTE — Telephone Encounter (Signed)
Notify patient that Ferritin level 10 normal 10 -291 previous 7   Lmtcb

## 2013-07-09 ENCOUNTER — Ambulatory Visit: Payer: BC Managed Care – PPO | Admitting: Certified Nurse Midwife

## 2013-07-09 NOTE — Telephone Encounter (Signed)
Spoke with pt who started cycle yesterday and is bleeding heavily today. Advised it would be best to reschedule per DL. Pt reports bleeding is usually heavy only for 2 days. Scheduled appt 07-11-13 at 4 pm per pt request for late appt.

## 2013-07-09 NOTE — Telephone Encounter (Signed)
Patient started cycle today and didn't know if it was ok to come to appt today.

## 2013-07-11 ENCOUNTER — Ambulatory Visit (INDEPENDENT_AMBULATORY_CARE_PROVIDER_SITE_OTHER): Payer: BC Managed Care – PPO | Admitting: Certified Nurse Midwife

## 2013-07-11 ENCOUNTER — Encounter: Payer: Self-pay | Admitting: Certified Nurse Midwife

## 2013-07-11 VITALS — BP 122/79 | HR 60 | Resp 16 | Ht 64.25 in | Wt 197.0 lb

## 2013-07-11 DIAGNOSIS — A630 Anogenital (venereal) warts: Secondary | ICD-10-CM

## 2013-07-11 NOTE — Progress Notes (Signed)
48 y.o. Married [place] female [phone] here for follow up of condyloma treated with  TCA  initiated on 06/25/13. Patient had no problems after treatment. Patient has not noted anymore genital warts in other areas or on spouse. Request treatment again today   O: Healthy WD,WN female Affect: normal orientation x 3   Pelvic exam:EXTERNAL GENITALIA: Condyloma noted on lower vulva area bilateral. The area treated previously has receded , but not resolved. The condyloma on the clitoris area has resolved No new areas noted.    A:Condyloma responding to TCA treatment. Patient requests treatment again.  P: Discussed findings of condyloma responding to treatment , expectations with re treatment and risk and benefit. Patient voiced understanding and request as above.   Procedure: Condyloma identified per diagram on vulva. KY jelly applied to surrounding healthy tissue. TCA applied to designated areas with acetowhite appearance noted. Patient tolerated procedure well. Instructions given.           RV 1 week

## 2013-07-12 NOTE — Progress Notes (Signed)
Reviewed personally.  M. Suzanne Malcom Selmer, MD.  

## 2013-07-19 ENCOUNTER — Ambulatory Visit (INDEPENDENT_AMBULATORY_CARE_PROVIDER_SITE_OTHER): Payer: BC Managed Care – PPO | Admitting: Certified Nurse Midwife

## 2013-07-19 ENCOUNTER — Encounter: Payer: Self-pay | Admitting: Certified Nurse Midwife

## 2013-07-19 VITALS — BP 127/77 | HR 69 | Resp 16 | Ht 64.25 in | Wt 201.0 lb

## 2013-07-19 DIAGNOSIS — A630 Anogenital (venereal) warts: Secondary | ICD-10-CM

## 2013-07-19 NOTE — Progress Notes (Signed)
48 y.o. Married [place] female [phone] here for follow up of condyloma treated with TCA initiated on 06/25/13. Completed all medication as directed.  Denies any problems with treatment. Patient did have slight irritation in one area but has resolved now. Patient requested re-treatment if needed with TCA. Denies any vaginal problems or discharge.  O: Healthy WD,WN female Affect: Normal, Orientation x 3   Pelvic exam:EXTERNAL GENITALIA: Condyloma noted on lower vulva area on right side only now. Left side area treated has resolved. A small area noted on external clitoral hood area again. No other new areas.  A:Condyloma responding to TCA treatment. Patient requests treatment again today.  P: Discussed findings of area resolved and treatment recommended on other vulva area again. Expectations and risks and benefits discussed. Patient voiced understanding and request treatment.  Procedure: Condyloma identified per diagram on vulva.KY jelly applied to healthy tissue. TCA applied to designated areas with acetowhite appearance noted. Patient tolerated procedure well. Instructions given.       RV 2 weeks

## 2013-07-19 NOTE — Progress Notes (Signed)
Reviewed personally.  M. Suzanne Shanina Kepple, MD.  

## 2013-08-01 ENCOUNTER — Encounter: Payer: Self-pay | Admitting: Certified Nurse Midwife

## 2013-08-01 ENCOUNTER — Ambulatory Visit (INDEPENDENT_AMBULATORY_CARE_PROVIDER_SITE_OTHER): Payer: BC Managed Care – PPO | Admitting: Certified Nurse Midwife

## 2013-08-01 VITALS — BP 108/68 | HR 68 | Resp 16 | Ht 64.25 in | Wt 199.0 lb

## 2013-08-01 DIAGNOSIS — A630 Anogenital (venereal) warts: Secondary | ICD-10-CM

## 2013-08-01 NOTE — Progress Notes (Signed)
48 y.o. Married [place] female [phone] here for follow up Sully treated with TCA initiated on 07/18/13. Completed all medication as directed.  Denies any problems after treatment. Patient feels they have improved and wants to continue treatment if indicated with TCA. She has not noted any new areas that she can feel. Denies vaginal discharge with odor or problems. Jenetta Downer: Healthy WD,WN female Affect:Normal, orientation x 3    Pelvic exam:EXTERNAL GENITALIA: Condyloma noted on  lower vulva area on right side. One small condyloma noted on left. Small area on clitoral hood still present, but smaller. No excoriated areas.  A:Condyloma responding treatment. Patient requests treatment again today.    P: Discussed findings of condyloma still present and risks and benefits of re treatment. Patient requests treatment again with TCA.  Procedure: Condyloma identified per diagram on vulva. KY jelly applied to healthy tissue. TCA applied to disignated areas with acetowhite appearance noted. Patient tolerated procedure well. Instructions given.     RV 2 weeks

## 2013-08-03 NOTE — Progress Notes (Signed)
Reviewed personally.  M. Suzanne Mio Schellinger, MD.  

## 2013-08-16 ENCOUNTER — Ambulatory Visit (INDEPENDENT_AMBULATORY_CARE_PROVIDER_SITE_OTHER): Payer: BC Managed Care – PPO | Admitting: Certified Nurse Midwife

## 2013-08-16 ENCOUNTER — Encounter: Payer: Self-pay | Admitting: Certified Nurse Midwife

## 2013-08-16 VITALS — BP 126/86 | HR 68 | Resp 20 | Ht 64.25 in | Wt 202.0 lb

## 2013-08-16 DIAGNOSIS — A630 Anogenital (venereal) warts: Secondary | ICD-10-CM

## 2013-08-16 NOTE — Progress Notes (Signed)
48 y.o. Married [place] American female [phone] here for follow up of condyloma treated with TCA  initiated on 06/25/13. Patient denies any problems with treatment and feels " they are almost all gone". Requests treatment again if needed with TCA. No vaginal symptoms or urinary symptoms.  O: Healthy WD,WN female Affect: normal, orientation x 3 Skin: warm and dry.  Pelvic exam:EXTERNAL GENITALIA: normal vulva, with condyloma only noted on two areas now. Other areas treated responded well treatment.  A:Condyloma responding to treatment, recommend retreatment of existing area today.   P: Discussed findings of condyloma. Discussed treating today which may be the last time. Risks and benefits of treatment with TCA given. Requests same.  Procedure: Heathy unaffected area covered with vaseline. TCA applied per diagram with acetowhite appearance noted. Patient tolerated procedure well. Instructions given. Hand out on folic acid foods given.        Recheck 3 weeks.

## 2013-08-17 NOTE — Progress Notes (Signed)
Reviewed personally.  M. Suzanne Tenita Cue, MD.  

## 2013-08-23 ENCOUNTER — Encounter (HOSPITAL_BASED_OUTPATIENT_CLINIC_OR_DEPARTMENT_OTHER): Payer: Self-pay | Admitting: Emergency Medicine

## 2013-08-23 ENCOUNTER — Emergency Department (HOSPITAL_BASED_OUTPATIENT_CLINIC_OR_DEPARTMENT_OTHER): Payer: BC Managed Care – PPO

## 2013-08-23 ENCOUNTER — Emergency Department (HOSPITAL_BASED_OUTPATIENT_CLINIC_OR_DEPARTMENT_OTHER)
Admission: EM | Admit: 2013-08-23 | Discharge: 2013-08-23 | Disposition: A | Discharge status: Home / Self Care | Payer: BC Managed Care – PPO | Attending: Emergency Medicine | Admitting: Emergency Medicine

## 2013-08-23 DIAGNOSIS — Z79899 Other long term (current) drug therapy: Secondary | ICD-10-CM | POA: Insufficient documentation

## 2013-08-23 DIAGNOSIS — M436 Torticollis: Secondary | ICD-10-CM | POA: Insufficient documentation

## 2013-08-23 DIAGNOSIS — Z8742 Personal history of other diseases of the female genital tract: Secondary | ICD-10-CM | POA: Insufficient documentation

## 2013-08-23 DIAGNOSIS — Z8619 Personal history of other infectious and parasitic diseases: Secondary | ICD-10-CM | POA: Insufficient documentation

## 2013-08-23 DIAGNOSIS — D649 Anemia, unspecified: Secondary | ICD-10-CM | POA: Insufficient documentation

## 2013-08-23 DIAGNOSIS — D219 Benign neoplasm of connective and other soft tissue, unspecified: Secondary | ICD-10-CM

## 2013-08-23 DIAGNOSIS — Z86711 Personal history of pulmonary embolism: Secondary | ICD-10-CM | POA: Insufficient documentation

## 2013-08-23 DIAGNOSIS — R42 Dizziness and giddiness: Secondary | ICD-10-CM | POA: Insufficient documentation

## 2013-08-23 DIAGNOSIS — I10 Essential (primary) hypertension: Secondary | ICD-10-CM | POA: Insufficient documentation

## 2013-08-23 LAB — TROPONIN I

## 2013-08-23 LAB — URINALYSIS, ROUTINE W REFLEX MICROSCOPIC
Bilirubin Urine: NEGATIVE
Glucose, UA: NEGATIVE mg/dL
HGB URINE DIPSTICK: NEGATIVE
Ketones, ur: NEGATIVE mg/dL
Leukocytes, UA: NEGATIVE
NITRITE: NEGATIVE
Protein, ur: NEGATIVE mg/dL
Specific Gravity, Urine: 1.01 (ref 1.005–1.030)
UROBILINOGEN UA: 0.2 mg/dL (ref 0.0–1.0)
pH: 7.5 (ref 5.0–8.0)

## 2013-08-23 LAB — CBC WITH DIFFERENTIAL/PLATELET
BASOS PCT: 1 % (ref 0–1)
Basophils Absolute: 0 10*3/uL (ref 0.0–0.1)
Eosinophils Absolute: 0.1 10*3/uL (ref 0.0–0.7)
Eosinophils Relative: 1 % (ref 0–5)
HEMATOCRIT: 35.2 % — AB (ref 36.0–46.0)
Hemoglobin: 11 g/dL — ABNORMAL LOW (ref 12.0–15.0)
LYMPHS PCT: 32 % (ref 12–46)
Lymphs Abs: 1.8 10*3/uL (ref 0.7–4.0)
MCH: 27.8 pg (ref 26.0–34.0)
MCHC: 31.3 g/dL (ref 30.0–36.0)
MCV: 88.9 fL (ref 78.0–100.0)
MONO ABS: 0.5 10*3/uL (ref 0.1–1.0)
Monocytes Relative: 8 % (ref 3–12)
Neutro Abs: 3.3 10*3/uL (ref 1.7–7.7)
Neutrophils Relative %: 58 % (ref 43–77)
Platelets: 321 10*3/uL (ref 150–400)
RBC: 3.96 MIL/uL (ref 3.87–5.11)
RDW: 13.8 % (ref 11.5–15.5)
WBC: 5.6 10*3/uL (ref 4.0–10.5)

## 2013-08-23 LAB — COMPREHENSIVE METABOLIC PANEL
ALT: 18 U/L (ref 0–35)
AST: 19 U/L (ref 0–37)
Albumin: 3.8 g/dL (ref 3.5–5.2)
Alkaline Phosphatase: 75 U/L (ref 39–117)
BUN: 9 mg/dL (ref 6–23)
CALCIUM: 9.7 mg/dL (ref 8.4–10.5)
CO2: 28 meq/L (ref 19–32)
CREATININE: 0.6 mg/dL (ref 0.50–1.10)
Chloride: 99 mEq/L (ref 96–112)
GFR calc Af Amer: >90 mL/min (ref >90)
Glucose, Bld: 100 mg/dL — ABNORMAL HIGH (ref 70–99)
Potassium: 4 mEq/L (ref 3.7–5.3)
Sodium: 138 mEq/L (ref 137–147)
Total Bilirubin: 0.2 mg/dL — ABNORMAL LOW (ref 0.3–1.2)
Total Protein: 9.3 g/dL — ABNORMAL HIGH (ref 6.0–8.3)

## 2013-08-23 MED ORDER — IOHEXOL 350 MG/ML SOLN
100.0000 mL | Freq: Once | INTRAVENOUS | Status: AC | PRN
  Administered 2013-08-23: 100 mL via INTRAVENOUS

## 2013-08-23 MED ORDER — DIAZEPAM 5 MG PO TABS: 5.0000 mg | ORAL_TABLET | Freq: Two times a day (BID) | ORAL | Status: DC

## 2013-08-23 MED ORDER — DIAZEPAM 5 MG PO TABS
5.0000 mg | ORAL_TABLET | Freq: Once | ORAL | Status: AC
  Administered 2013-08-23: 5 mg via ORAL
  Filled 2013-08-23: qty 1

## 2013-08-23 MED ORDER — IBUPROFEN 800 MG PO TABS: 800.0000 mg | ORAL_TABLET | Freq: Three times a day (TID) | ORAL | Status: DC

## 2013-08-23 NOTE — ED Provider Notes (Signed)
Medical screening examination/treatment/procedure(s) were conducted as a shared visit with non-physician practitioner(s) and myself.  I personally evaluated the patient during the encounter.   EKG Interpretation   Date/Time:  Thursday August 23 2013 16:57:23 EDT Ventricular Rate:  69 PR Interval:  162 QRS Duration: 86 QT Interval:  414 QTC Calculation: 443 R Axis:   -25 Text Interpretation:  Normal sinus rhythm Possible Anterior infarct , age  undetermined Abnormal ECG Similar to prior Confirmed by Mingo Amber  MD, [facility]  [phone]) on 08/23/2013 5:00:22 PM      Patient your dizziness and left neck pain. Spontaneous left neck pain. No alleviating or exacerbating factors. Has had intermittent dizziness for the past 2 days is getting worse. Chronic dizziness for the past couple weeks, has seen Cardiology and has been cleared by them. Has followup with Neurology in 3 days. With spontaneous left neck pain and occasional radiation to left arm, will CT her neck to look for carotid dissection. CT normal.  Osvaldo Shipper, MD 08/23/13 2257

## 2013-08-23 NOTE — ED Notes (Signed)
Dizziness, tightness in her left neck and left shoulder.

## 2013-08-23 NOTE — Discharge Instructions (Signed)
Dizziness Dizziness is a common problem. It is a feeling of unsteadiness or lightheadedness. You may feel like you are about to faint. Dizziness can lead to injury if you stumble or fall. A person of any age group can suffer from dizziness, but dizziness is more common in older adults. CAUSES  Dizziness can be caused by many different things, including:  Middle ear problems.  Standing for too long.  Infections.  An allergic reaction.  Aging.  An emotional response to something, such as the sight of blood.  Side effects of medicines.  Fatigue.  Problems with circulation or blood pressure.  Excess use of alcohol, medicines, or illegal drug use.  Breathing too fast (hyperventilation).  An arrhythmia or problems with your heart rhythm.  Low red blood cell count (anemia).  Pregnancy.  Vomiting, diarrhea, fever, or other illnesses that cause dehydration.  Diseases or conditions such as Parkinson's disease, high blood pressure (hypertension), diabetes, and thyroid problems.  Exposure to extreme heat. DIAGNOSIS  To find the cause of your dizziness, your caregiver may do a physical exam, lab tests, radiologic imaging scans, or an electrocardiography test (ECG).  TREATMENT  Treatment of dizziness depends on the cause of your symptoms and can vary greatly. HOME CARE INSTRUCTIONS   Drink enough fluids to keep your urine clear or pale yellow. This is especially important in very hot weather. In the elderly, it is also important in cold weather.  If your dizziness is caused by medicines, take them exactly as directed. When taking blood pressure medicines, it is especially important to get up slowly.  Rise slowly from chairs and steady yourself until you feel okay.  In the morning, first sit up on the side of the bed. When this seems okay, stand slowly while holding onto something until you know your balance is fine.  If you need to stand in one place for a long time, be sure to  move your legs often. Tighten and relax the muscles in your legs while standing.  If dizziness continues to be a problem, have someone stay with you for a day or two. Do this until you feel you are well enough to stay alone. Have the person call your caregiver if he or she notices changes in you that are concerning.  Do not drive or use heavy machinery if you feel dizzy.  Do not drink alcohol. SEEK IMMEDIATE MEDICAL CARE IF:   Your dizziness or lightheadedness gets worse.  You feel nauseous or vomit.  You develop problems with talking, walking, weakness, or using your arms, hands, or legs.  You are not thinking clearly or you have difficulty forming sentences. It may take a friend or family member to determine if your thinking is normal.  You develop chest pain, abdominal pain, shortness of breath, or sweating.  Your vision changes.  You notice any bleeding.  You have side effects from medicine that seems to be getting worse rather than better. MAKE SURE YOU:   Understand these instructions.  Will watch your condition.  Will get help right away if you are not doing well or get worse. Document Released: 10/27/2000 Document Revised: 07/26/2011 Document Reviewed: 11/20/2010 [facility] Patient Information 2014 [place], [place]. Torticollis, Acute You have suddenly (acutely) developed a twisted neck (torticollis). This is usually a self-limited condition. CAUSES  Acute torticollis may be caused by malposition, trauma or infection. Most commonly, acute torticollis is caused by sleeping in an awkward position. Torticollis may also be caused by the flexion, extension  or twisting of the neck muscles beyond their normal position. Sometimes, the exact cause may not be known. SYMPTOMS  Usually, there is pain and limited movement of the neck. Your neck may twist to one side. DIAGNOSIS  The diagnosis is often made by physical examination. X-rays, CT scans or MRIs may be done if there is a  history of trauma or concern of infection. TREATMENT  For a common, stiff neck that develops during sleep, treatment is focused on relaxing the contracted neck muscle. Medications (including shots) may be used to treat the problem. Most cases resolve in several days. Torticollis usually responds to conservative physical therapy. If left untreated, the shortened and spastic neck muscle can cause deformities in the face and neck. Rarely, surgery is required. HOME CARE INSTRUCTIONS   Use over-the-counter and prescription medications as directed by your caregiver.  Do stretching exercises and massage the neck as directed by your caregiver.  Follow up with physical therapy if needed and as directed by your caregiver. SEEK IMMEDIATE MEDICAL CARE IF:   You develop difficulty breathing or noisy breathing (stridor).  You drool, develop trouble swallowing or have pain with swallowing.  You develop numbness or weakness in the hands or feet.  You have changes in speech or vision.  You have problems with urination or bowel movements.  You have difficulty walking.  You have a fever.  You have increased pain. MAKE SURE YOU:   Understand these instructions.  Will watch your condition.  Will get help right away if you are not doing well or get worse. Document Released: 04/30/2000 Document Revised: 07/26/2011 Document Reviewed: 06/11/2009 [facility] Patient Information 2014 [place], [place].

## 2013-08-23 NOTE — ED Notes (Signed)
MD at bedside. 

## 2013-08-23 NOTE — ED Provider Notes (Signed)
CSN: [id number]     Arrival date & time 08/23/13  1622 History   First MD Initiated Contact with Patient 08/23/13 1725     Chief Complaint  Patient presents with  . Dizziness     (Consider location/radiation/quality/duration/timing/severity/associated sxs/prior Treatment) Patient is a 48 y.o. female presenting with dizziness. The history is provided by the patient. No language interpreter was used.  Dizziness Quality:  Lightheadedness Severity:  Moderate Onset quality:  Gradual Timing:  Constant Progression:  Worsening Chronicity:  New Context: ear pain, head movement and standing up   Worsened by:  Nothing tried Ineffective treatments:  None tried Associated symptoms: no chest pain   Risk factors: no hx of stroke   Pt complains of stiffness in her neck that started today.    Pt complains of problems turning her neck.   Pt reports pain goes down into her shoulder  Past Medical History  Diagnosis Date  . Hypertension   . Pulmonary embolism 2008    secondary to OCP  . Abnormal Pap smear 7/13    ASCUS/positive HR HPV, negative colpo  . Anemia 2006  . Fibroids     with enlarged uterus  . 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 Myomectomy    . Oophorectomy      left  . Left finger reattachment  age 66   Family History  Problem Relation Age of Onset  . Diabetes Mother   . Hypertension Mother   . Heart disease Mother     cardiomegaly  . Lupus Mother   . Diabetes Father   . Hypertension Father   . Stroke Father   . Hypertension Maternal Grandmother   . Stroke Maternal Grandmother    History  Substance Use Topics  . Smoking status: Never Smoker   . Smokeless tobacco: Never Used  . Alcohol Use: 0.5 oz/week    1 drink(s) per week     Comment: occ glass of wine   OB History   Grav Para Term Preterm Abortions TAB SAB Ect Mult Living   3 0   3     0     Review of Systems  Cardiovascular: Negative for chest pain.   Neurological: Positive for dizziness.  All other systems reviewed and are negative.     Allergies  Minocycline hcl and Sulfa antibiotics  Home Medications   Current Outpatient Rx  Name  Route  Sig  Dispense  Refill  . Dexlansoprazole (DEXILANT PO)   Oral   Take by mouth.         Marland Kitchen BIOTIN PO   Oral   Take 10,000 mcg by mouth daily.          . hydrochlorothiazide 25 MG tablet   Oral   Take 25 mg by mouth daily.           . Iron-FA-B Cmp-C-Biot-Probiotic (FUSION PLUS) CAPS   Oral   Take 1 capsule by mouth daily.   90 capsule   4   . Multiple Vitamins-Minerals (MULTIVITAMIN PO)   Oral   Take by mouth daily.         . valsartan (DIOVAN) 320 MG tablet   Oral   Take 320 mg by mouth daily.          BP 134/81  Pulse 74  Temp(Src) 98.8 F (37.1 C) (Oral)  Resp 18  Ht 5\' 4"  (1.626 m)  Wt 202 lb (91.627 kg)  BMI 34.66 kg/m2  SpO2 97%  LMP 08/06/2013 Physical Exam  Nursing note and vitals reviewed. Constitutional: She is oriented to person, place, and time. She appears well-developed and well-nourished.  HENT:  Head: Normocephalic.  Left Ear: External ear normal.  Eyes: EOM are normal.  Neck: Normal range of motion.  Cardiovascular: Normal rate and normal heart sounds.   Pulmonary/Chest: Effort normal.  Abdominal: Soft. She exhibits no distension.  Musculoskeletal: Normal range of motion.  Neurological: She is alert and oriented to person, place, and time.  Skin: Skin is warm.  Psychiatric: She has a normal mood and affect.    ED Course  Procedures (including critical care time) Labs Review Labs Reviewed - No data to display Imaging Review No results found.   EKG Interpretation   Date/Time:  Thursday August 23 2013 16:57:23 EDT Ventricular Rate:  69 PR Interval:  162 QRS Duration: 86 QT Interval:  414 QTC Calculation: 443 R Axis:   -25 Text Interpretation:  Normal sinus rhythm Possible Anterior infarct , age  undetermined Abnormal ECG  Similar to prior Confirmed by [facility]  MD, [place]  ([id number]) on 08/23/2013 5:00:22 PM      MDM  Ct angio neck is normal.    Labs hemoglobin is 11.   Pt has a history of anemia.   No wbc's   Urine is normal.    Pt advised to keep appointment with neurology.   I will treat neck discomfort like torticollis.     Final diagnoses:  Torticollis  Dizziness      Fransico Meadow, [place] 08/23/13 2032

## 2013-08-28 ENCOUNTER — Encounter: Payer: Self-pay | Admitting: Neurology

## 2013-08-28 ENCOUNTER — Ambulatory Visit (INDEPENDENT_AMBULATORY_CARE_PROVIDER_SITE_OTHER): Payer: BC Managed Care – PPO | Admitting: Neurology

## 2013-08-28 ENCOUNTER — Telehealth: Payer: Self-pay | Admitting: Certified Nurse Midwife

## 2013-08-28 VITALS — BP 122/77 | HR 73 | Ht 65.0 in | Wt 198.0 lb

## 2013-08-28 DIAGNOSIS — R42 Dizziness and giddiness: Secondary | ICD-10-CM | POA: Insufficient documentation

## 2013-08-28 MED ORDER — TOPIRAMATE 25 MG PO TABS: ORAL_TABLET | ORAL | Status: DC

## 2013-08-28 NOTE — Telephone Encounter (Signed)
Spoke with patient. Patient states that she needs to come in for office visit with Regina Eck CNM due to an "increased flare up." Patient resistant to triage stating "If you just tell Regina Eck CNM my symptoms are getting worse she will know what I am talking about. She has been treating it for a while now." Offered office visit today but patient declines stating that she has another appointment today and cant make it. Appointment made for Thursday at 4:00 with Regina Eck CNM. Patient agreeable to date and time. Will call back with any further questions or concerns.  Routing to provider for final review. Patient agreeable to disposition. Will close encounter

## 2013-08-28 NOTE — Telephone Encounter (Signed)
Routing to Joy.  

## 2013-08-28 NOTE — Progress Notes (Signed)
Reason for visit: Dizziness  [name] is a 48 y.o. female  History of present illness:  Ms. [name] is a 48 year old right-handed black female with a history of dizziness and pressure sensations in the head that dates back a year or a year and a half. The patient indicates that she will have tight sensations in the head in the bifrontal areas or down the left temporal area into the left neck. The patient indicates that she also has some dizziness sensations that is a lightheaded feeling when she is up and active, but when she lies down, she may have a spinning or vertiginous sensation. The episodes may come on intermittently, and last 1 or 2 days at a time. The patient indicates that she may have one or 2 episodes a week. The episodes have become more frequent over time. The patient denies any confusion with the events. The patient may have some tingling sensations in the right arm at times. The patient denies any blackout events, but she feels at times that she might black out. The patient has no muscle weakness of the extremities, and no problems controlling the bowels or the bladder. The patient denies any significant balance problems. The patient in the past has had MRI evaluation of the brain that was normal in January of 2014. This study was done for the issues noted above. CT angiogram evaluation of the cerebrovascular circulation was unremarkable. The patient is sent to this office for an evaluation. The patient reports some twitching around the left eye and a heavy sensation on the left eye.  Past Medical History  Diagnosis Date  . Hypertension   . Pulmonary embolism 2008    secondary to OCP  . Abnormal Pap smear 7/13    ASCUS/positive HR HPV, negative colpo  . Anemia 2006  . Fibroids     with enlarged uterus  . 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 Myomectomy    . Oophorectomy      left  . Left finger reattachment  age  81    Family History  Problem Relation Age of Onset  . Diabetes Mother   . Hypertension Mother   . Heart disease Mother     cardiomegaly  . Lupus Mother   . Diabetes Father   . Hypertension Father   . Stroke Father   . Hypertension Maternal Grandmother   . Stroke Maternal Grandmother   . Hypertension Sister   . Seizures Sister   . COPD Sister     Social history:  reports that she has never smoked. She has never used smokeless tobacco. She reports that she drinks about .5 ounces of alcohol per week. She reports that she does not use illicit drugs.  Medications:  Current Outpatient Prescriptions on File Prior to Visit  Medication Sig Dispense Refill  . BIOTIN PO Take 10,000 mcg by mouth daily.       Marland Kitchen Dexlansoprazole (DEXILANT PO) Take 60 mg by mouth daily.       . hydrochlorothiazide 25 MG tablet Take 25 mg by mouth daily.        . Iron-FA-B Cmp-C-Biot-Probiotic (FUSION PLUS) CAPS Take 1 capsule by mouth daily.  90 capsule  4  . Multiple Vitamins-Minerals (MULTIVITAMIN PO) Take by mouth daily.      . valsartan (DIOVAN) 320 MG tablet Take 320 mg by mouth daily.       No current facility-administered medications on  file prior to visit.      Allergies  Allergen Reactions  . Minocycline Hcl Hives and Itching  . Sulfa Antibiotics Swelling    ROS:  Out of a complete 14 system review of symptoms, the patient complains only of the following symptoms, and all other reviewed systems are negative.  Ringing in the ears, dizziness, trouble swallowing Blurred vision Shortness of breath, cough, snoring Anemia Feeling hot  Blood pressure 122/77, pulse 73, height 5\' 5"  (1.651 m), weight 198 lb (89.812 kg), last menstrual period 08/06/2013.  Physical Exam  General: The patient is alert and cooperative at the time of the examination.  Eyes: Pupils are equal, round, and reactive to light. Discs are flat bilaterally.  Neck: The neck is supple, no carotid bruits are noted.  Tympanic membranes are clear bilaterally.  Respiratory: The respiratory examination is clear.  Cardiovascular: The cardiovascular examination reveals a regular rate and rhythm, no obvious murmurs or rubs are noted.  Neuromuscular: Range of movement of the cervical spine is full.  Skin: Extremities are without significant edema.  Neurologic Exam  Mental status: The patient is alert and oriented x 3 at the time of the examination. The patient has apparent normal recent and remote memory, with an apparently normal attention span and concentration ability.  Cranial nerves: Facial symmetry is present. There is good sensation of the face to pinprick and soft touch bilaterally. The strength of the facial muscles and the muscles to head turning and shoulder shrug are normal bilaterally. Speech is well enunciated, no aphasia or dysarthria is noted. Extraocular movements are full. Visual fields are full. The tongue is midline, and the patient has symmetric elevation of the soft palate. No obvious hearing deficits are noted.  Motor: The motor testing reveals 5 over 5 strength of all 4 extremities. Good symmetric motor tone is noted throughout.  Sensory: Sensory testing is intact to pinprick, soft touch, vibration sensation, and position sense on all 4 extremities. No evidence of extinction is noted.  Coordination: Cerebellar testing reveals good finger-nose-finger and heel-to-shin bilaterally. The Nyan-Barrany procedure was negative.  Gait and station: Gait is normal. Tandem gait is normal. Romberg is negative. No drift is seen.  Reflexes: Deep tendon reflexes are symmetric and normal bilaterally. Toes are downgoing bilaterally.   Assessment/Plan:  1. Dizziness, pressure sensations  The patient reports episodic events associated with left-sided and bifrontal pressure sensations and dizziness that may occur once or twice a week, lasting one or 2 days at a time. The episodes are unusual, but the  patient has had a thorough neurologic evaluation already. The patient will be given treatment for what may be atypical migraine. The patient will be placed on Topamax and she will followup in 3 or 4 months. If the patient is tolerating the medication but it is not effective, the dose will be increased.  Jill Alexanders MD 08/28/2013 7:47 PM  [facility] [address] [place], [place] [address]  Phone [phone] Fax [phone]

## 2013-08-28 NOTE — Patient Instructions (Signed)

## 2013-08-28 NOTE — Telephone Encounter (Signed)
Patient is asking if she could talk to Debbi's nurse. No details given, just asking to talk with Joy.

## 2013-08-30 ENCOUNTER — Ambulatory Visit (INDEPENDENT_AMBULATORY_CARE_PROVIDER_SITE_OTHER): Payer: BC Managed Care – PPO | Admitting: Certified Nurse Midwife

## 2013-08-30 ENCOUNTER — Encounter: Payer: Self-pay | Admitting: Certified Nurse Midwife

## 2013-08-30 VITALS — BP 100/60 | Resp 14 | Ht 65.0 in | Wt 198.0 lb

## 2013-08-30 DIAGNOSIS — B373 Candidiasis of vulva and vagina: Secondary | ICD-10-CM

## 2013-08-30 DIAGNOSIS — N760 Acute vaginitis: Secondary | ICD-10-CM

## 2013-08-30 DIAGNOSIS — A630 Anogenital (venereal) warts: Secondary | ICD-10-CM

## 2013-08-30 DIAGNOSIS — B3731 Acute candidiasis of vulva and vagina: Secondary | ICD-10-CM

## 2013-08-30 MED ORDER — METRONIDAZOLE 500 MG PO TABS: 500.0000 mg | ORAL_TABLET | Freq: Two times a day (BID) | ORAL | Status: DC

## 2013-08-30 MED ORDER — FLUCONAZOLE 150 MG PO TABS: ORAL_TABLET | ORAL | Status: DC

## 2013-08-30 NOTE — Patient Instructions (Signed)

## 2013-08-31 NOTE — Progress Notes (Signed)
48 y.o. Married [place] American female [phone] here for follow up of condyloma last treated withTCA  on 08/16/13. Patient denies any problems with past treatment, felt they had all resolved. Two days ago she was feeling increase discharge and noted she felt "warts again, but everywhere". Patient feels they have "exploded". Denies new personal products, no issues with spouse and contact. Patient did take bubble bath, and this occurred ? After that. Patient aware it is a virus and with immune changes it can also reoccur. No new medications. Complaining of increase vaginal discharge with odor and slight itching only.  O: Healthy WD,WN female Affect: normal, orientation x 3 Inguinal lymph nodes: non tender, no enlargement  Pelvic exam:EXTERNAL GENITALIA: Vulva bilateral has condyloma noted per diagram. VAGINA: discharge grey in color and with odor, Wet Prep/KOH positive clue cells, pH 5.5 and positive for yeast CERVIX: no lesions or cervical motion tenderness and normal appearance UTERUS: mid and normal, non tender ADNEXA: no masses palpable and nontender  A:Condyloma increased in number, ? Change in immune status or exposure to new HPV BV Yeast vaginitis    P: Discussed findings of condyloma from prior visit and possible etiology of change in immune status(patient does not feel a change), no partner change, will check to see if spouse has noted any condyloma.Discussed will not treat at this time due to amount and vaginal findings. Discussed BV and yeast presence and need for treatment. Agreeable. Rx Flagyl see order Rx Diflucan see order. Aveeno sitz bath prn comfort. Recheck in 2 weeks. Questions addressed.  Rv as above, prn   Labs   Instructions given regarding:  RV

## 2013-09-03 ENCOUNTER — Ambulatory Visit: Payer: BC Managed Care – PPO | Admitting: Certified Nurse Midwife

## 2013-09-04 ENCOUNTER — Telehealth: Payer: Self-pay | Admitting: Certified Nurse Midwife

## 2013-09-04 NOTE — Telephone Encounter (Signed)
Left message to call Analaya Hoey at [phone]. 

## 2013-09-04 NOTE — Telephone Encounter (Signed)
Spoke with patient. Patient states "I am having some itching in the area where my warts are." States itching has been going on for a couple of days and would like to know if Regina Eck CNM can recommend or prescribe anything to ease the itching but not cause a flare up. Has been bathing in Epson salt to soothe symptoms but states she is still very uncomfortable. Advised would send message to Regina Eck CNM for recommendations and further instructions and call patient back. Patient agreeable.  Preferred pharmacy: [facility] off [address]

## 2013-09-04 NOTE — Telephone Encounter (Signed)
Spoke with patient. Advised to wait 48 hours before starting Diflucan after completing Flagyl. Patient agreeable and verbalizes understanding. Will call back with any further needs, questions, or concerns.  Routing to provider for final review. Patient agreeable to disposition. Will close encounter

## 2013-09-04 NOTE — Telephone Encounter (Signed)
Left message to call Kaitlyn at [phone]. 

## 2013-09-04 NOTE — Telephone Encounter (Signed)
48 hours is correct after completion of Flagyl

## 2013-09-04 NOTE — Telephone Encounter (Signed)
Spoke with patient. Message from Regina Eck CNM given. Patient agreeable and verbalizes understanding. Will call back if itching is not relieved. Patient states she was prescribed Flagyl and Diflucan on 4/16 by Regina Eck CNM. Patient states that she will have completed taking Flagyl this Thursday and forgot how long Regina Eck CNM advised her to wait in between completing Flagyl and starting Diflucan. Patient states that she thinks she needs to start on Saturday but would like me to clarify instructions with Regina Eck CNM. Advised would verify instructions and call patient back. Patient agreeable.   Regina Eck CNM, how long would you like patient to wait between completing Flagyl and beginning Diflucan? 48 hours?

## 2013-09-04 NOTE — Telephone Encounter (Signed)
Patient can switch to Aveeno for bath and use Aveeno anti itch cream that has oatmeal in it which should soothe the tissue.

## 2013-09-04 NOTE — Telephone Encounter (Signed)
Pt wants to talk to [place] she is not returning a call and did not give any information.

## 2013-09-12 NOTE — Progress Notes (Signed)
Reviewed personally.  Felipa Emory, MD. Consider surgical options for condyloma treatment.

## 2013-09-13 ENCOUNTER — Ambulatory Visit (INDEPENDENT_AMBULATORY_CARE_PROVIDER_SITE_OTHER): Payer: BC Managed Care – PPO | Admitting: Certified Nurse Midwife

## 2013-09-13 ENCOUNTER — Ambulatory Visit: Payer: BC Managed Care – PPO | Admitting: Certified Nurse Midwife

## 2013-09-13 ENCOUNTER — Encounter: Payer: Self-pay | Admitting: Certified Nurse Midwife

## 2013-09-13 VITALS — BP 110/70 | HR 74 | Resp 16 | Ht 65.25 in | Wt 195.0 lb

## 2013-09-13 DIAGNOSIS — R5383 Other fatigue: Secondary | ICD-10-CM

## 2013-09-13 DIAGNOSIS — A630 Anogenital (venereal) warts: Secondary | ICD-10-CM

## 2013-09-13 DIAGNOSIS — R5381 Other malaise: Secondary | ICD-10-CM

## 2013-09-13 NOTE — Progress Notes (Signed)
48 y.o. Married [place] American female [phone] here for follow up of BV,yeast vaginits, and condyloma treated with Flagyl and DIflucan initiated on 08/31/13. Condyloma was not treated due to aggressive growth noted. Completed all medication as directed.  Denies any symptoms of vaginal discharge or odor. Patient on menses today. Patient has been working on eating better, sleeping and increase folic acid in diet. She travels(flying) several times a week for job. She feels this has depleted her immune system too. She has not noted in new areas of condyloma and feels these have decreased. Last PCP visit she was told she was pre diabetic, but not given any special diet or follow up. No other health issues today.  O: Healthy WD,WN female, appears fatigued Affect:normal, orientation x 3  Skin:warm and dry Inguinal lymph nodes not enlarged or tender Pelvic exam:EXTERNAL GENITALIA: numerous areas of condyloma noted, but smaller in size and some resolved, otherwise normal appearance VAGINA: no abnormal discharge noted, blood present, very small condyloma noted on vaginal walls appear to be resolving   CERVIX: no condyloma noted   A:BV and yeast treated, no TOC due menses Aggressive episode of condyloma decreasing in amount Fatigue ? Immune system response causing increase condyloma   P: Discussed findings of BV,yeast appear resolved and Condyloma decreasing in size and amount( patient agrees also they have decreased) will not treat at this time allow her immune system to decrease activity. Discussed importance of rest and increase in immune response. Continue with good diet, sleeping as regular as possible. Continue Aveeno bath for comfort prn. Lab: TSH,Vit.D, Hgb A1-C   RV prn unless change

## 2013-09-13 NOTE — Progress Notes (Signed)
Reviewed personally.  M. Suzanne Lennox Leikam, MD.  

## 2013-09-15 LAB — HEMOGLOBIN A1C
HEMOGLOBIN A1C: 5.4 % (ref <5.7)
Mean Plasma Glucose: 108 mg/dL (ref <117)

## 2013-09-15 LAB — TSH: TSH: 1.016 u[IU]/mL (ref 0.350–4.500)

## 2013-09-17 LAB — VITAMIN D 25 HYDROXY (VIT D DEFICIENCY, FRACTURES): Vit D, 25-Hydroxy: 26 ng/mL — ABNORMAL LOW (ref 30–89)

## 2013-10-17 ENCOUNTER — Telehealth: Payer: Self-pay | Admitting: Certified Nurse Midwife

## 2013-10-17 ENCOUNTER — Telehealth: Payer: Self-pay

## 2013-10-17 NOTE — Telephone Encounter (Signed)
Pt returning call

## 2013-10-17 NOTE — Telephone Encounter (Signed)
Spoke with patient. Patient states that she would like to come in to see Regina Eck CNM regarding her "vaginal warts". Patient requesting Friday appointment. Offered Friday at 10am and 2:45 but patient declines stating that she does not have her calendar available at this time. Requesting Thursday late afternoon. Offered 10am, 2:45pm, or 4:00pm. Patient declines stating that she really needs to look at her calendar first and then give [place] a call back for the best time to schedule. Advised this would be fine and that we will be happy to get her scheduled upon her return call.  Routing to provider for final review. Patient agreeable to disposition. Will close encounter

## 2013-10-17 NOTE — Telephone Encounter (Signed)
Spoke with patient at time of incoming call. Patient would like to schedule appointment to see Regina Eck CNM to discuss condyloma at this time. Patient requesting Friday appointment. Appointment scheduled for Friday at 2:45pm with Regina Eck CNM. Patient agreeable to date and time.  Routing to provider for final review. Patient agreeable to disposition. Will close encounter

## 2013-10-17 NOTE — Telephone Encounter (Signed)
Patient is calling to see what the next course of action is for her "warts"

## 2013-10-17 NOTE — Telephone Encounter (Signed)
Left message to call Kaitlyn at [phone]. 

## 2013-10-17 NOTE — Telephone Encounter (Signed)
Patient is calling to see what her next course of action is for her "warts"

## 2013-10-19 ENCOUNTER — Encounter: Payer: Self-pay | Admitting: Certified Nurse Midwife

## 2013-10-19 ENCOUNTER — Ambulatory Visit (INDEPENDENT_AMBULATORY_CARE_PROVIDER_SITE_OTHER): Payer: BC Managed Care – PPO | Admitting: Certified Nurse Midwife

## 2013-10-19 VITALS — BP 120/78 | HR 72 | Resp 16 | Ht 65.25 in | Wt 198.0 lb

## 2013-10-19 DIAGNOSIS — D509 Iron deficiency anemia, unspecified: Secondary | ICD-10-CM

## 2013-10-19 DIAGNOSIS — A630 Anogenital (venereal) warts: Secondary | ICD-10-CM

## 2013-10-19 LAB — CBC
HCT: 30.8 % — ABNORMAL LOW (ref 36.0–46.0)
Hemoglobin: 10.4 g/dL — ABNORMAL LOW (ref 12.0–15.0)
MCH: 27.7 pg (ref 26.0–34.0)
MCHC: 33.8 g/dL (ref 30.0–36.0)
MCV: 81.9 fL (ref 78.0–100.0)
Platelets: 369 10*3/uL (ref 150–400)
RBC: 3.76 MIL/uL — ABNORMAL LOW (ref 3.87–5.11)
RDW: 14.5 % (ref 11.5–15.5)
WBC: 5.9 10*3/uL (ref 4.0–10.5)

## 2013-10-19 LAB — IBC PANEL
%SAT: 41 % (ref 20–55)
TIBC: 287 ug/dL (ref 250–470)
UIBC: 168 ug/dL (ref 125–400)

## 2013-10-19 LAB — IRON: Iron: 119 ug/dL (ref 42–145)

## 2013-10-19 LAB — FERRITIN: Ferritin: 11 ng/mL (ref 10–291)

## 2013-10-19 NOTE — Progress Notes (Signed)
48 y.o. Married [place] female [phone] here for follow up of condyloma noted to have increased in amount on 08/30/13. Patient has been working on stress reduction, folic acid in diet and eating healthy to help with control. Patient here today to discuss possible plan of treatment   O: Healthy WD,WN female Affect: Normal   Pelvic exam:EXTERNAL GENITALIA: normal appearing vulva with no masses, tenderness, Condyloma noted bilateral per diagram with large condyloma at base of right vulva VAGINA: no abnormal discharge or lesions and condyloma present inside introitus, small condyloma noted bilaterally RECTUM: exam not indicated, condyloma noted around anal entrance     A:Condyloma with numerous growths noted Previous treatment with TCA with good response  Patient needs referral for possible laser treatment for large growths. History of anemia, labs due, on Fusion Plus daily  P: Discussed findings of some reduction in amount since last visit. Discussed etiology of condyloma and immune status. Discussed with patient option of laser treatment or excision and referral for evaluation. Patient would like to seek consult, but now is not a good time for her. Her grandfather is gravely ill. She will call when ready to schedule. Labs:Iron, IBC, Ferritin, CBC Continue with Fusion Plus as ordered, has Rx. Expressed my thoughts for her and family regarding grandfather.   RV prn

## 2013-10-22 NOTE — Progress Notes (Signed)
I did put in as lab collect already.

## 2013-10-27 NOTE — Progress Notes (Signed)
Reviewed personally.  M. Suzanne Irina Okelly, MD.  

## 2013-12-31 ENCOUNTER — Telehealth: Payer: Self-pay | Admitting: *Deleted

## 2013-12-31 NOTE — Telephone Encounter (Signed)
Spoke with patient to r/s appointment, patient stated that she was driving and would call back to r/s with NP MM.

## 2013-12-31 NOTE — Telephone Encounter (Signed)
Calling patient to r/s appointment, no answer, left message to return the call

## 2014-01-02 NOTE — Telephone Encounter (Signed)
Patient returning my call, appointment has been scheduled for 01/04/14 at 2 pm.

## 2014-01-04 ENCOUNTER — Encounter: Payer: Self-pay | Admitting: Adult Health

## 2014-01-04 ENCOUNTER — Ambulatory Visit (INDEPENDENT_AMBULATORY_CARE_PROVIDER_SITE_OTHER): Payer: BC Managed Care – PPO | Admitting: Adult Health

## 2014-01-04 VITALS — BP 150/93 | HR 62 | Ht 64.75 in | Wt 197.0 lb

## 2014-01-04 DIAGNOSIS — R209 Unspecified disturbances of skin sensation: Secondary | ICD-10-CM

## 2014-01-04 DIAGNOSIS — R42 Dizziness and giddiness: Secondary | ICD-10-CM

## 2014-01-04 DIAGNOSIS — R5383 Other fatigue: Secondary | ICD-10-CM

## 2014-01-04 DIAGNOSIS — R5381 Other malaise: Secondary | ICD-10-CM

## 2014-01-04 DIAGNOSIS — R200 Anesthesia of skin: Secondary | ICD-10-CM | POA: Insufficient documentation

## 2014-01-04 MED ORDER — DULOXETINE HCL 30 MG PO CPEP: ORAL_CAPSULE | ORAL | Status: DC

## 2014-01-04 NOTE — Progress Notes (Signed)
PATIENT: [name] DOB: 09-05-1965  REASON FOR VISIT: follow up HISTORY FROM: patient  HISTORY OF PRESENT ILLNESS: Ms. [name] is a 48 year old black female with a history of dizziness and pressure in her head. She returns today for follow-up. 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 She was started on Topamax but she stopped taking it. She continues to have dizziness that she describes as lightheaded and fogginess. She also had some ringing in the ears. She saw ENT for the ringing in the ears but everything was unremarkable. She does have some sinus issues and her PCP put her on allegra and nasonex. She states that she initially felt better on the allegra but soon after her symptoms returned. She states that the pressure in her head and tightness in her head is intermittent. She states that at times she has what feels like numbness on the left side of her face and a tightness. She states that she feels fatigued throughout the day. She states that she has not had any new neurological symptoms since the last visit. She does state that she noticed that her symptoms began after her Father's stroke. She is unsure if her symptoms are related to stress.   HISTORY 08/28/13 (CW): history of dizziness and pressure sensations in the head that dates back a year or a year and a half. The patient indicates that she will have tight sensations in the head in the bifrontal areas or down the left temporal area into the left neck. The patient indicates that she also has some dizziness sensations that is a lightheaded feeling when she is up and active, but when she lies down, she may have a spinning or vertiginous sensation. The episodes may come on intermittently, and last 1 or 2 days at a time. The patient indicates that she may have one or 2 episodes a week. The episodes have become more frequent over time. The patient denies any confusion with the events. The patient may have some tingling sensations  in the right arm at times. The patient denies any blackout events, but she feels at times that she might black out. The patient has no muscle weakness of the extremities, and no problems controlling the bowels or the bladder. The patient denies any significant balance problems. The patient in the past has had MRI evaluation of the brain that was normal in January of 2014. This study was done for the issues noted above. CT angiogram evaluation of the cerebrovascular circulation was unremarkable. The patient is sent to this office for an evaluation. The patient reports some twitching around the left eye and a heavy sensation on the left eye.   REVIEW OF SYSTEMS: Full 14 system review of systems performed and notable only for:  Constitutional: N/A  Eyes: N/A Ear/Nose/Throat: N/A  Skin: N/A  Cardiovascular: N/A  Respiratory: N/A  Gastrointestinal: N/A  Genitourinary: N/A Hematology/Lymphatic: N/A  Endocrine: N/A Musculoskeletal:N/A  Allergy/Immunology: N/A  Neurological: dizziness and numbness Psychiatric: N/A Sleep: N/A   ALLERGIES: Allergies  Allergen Reactions  . Minocycline Hcl Hives and Itching  . Sulfa Antibiotics Swelling    HOME MEDICATIONS: Outpatient Prescriptions Prior to Visit  Medication Sig Dispense Refill  . Dexlansoprazole (DEXILANT PO) Take 60 mg by mouth daily.       . hydrochlorothiazide 25 MG tablet Take 25 mg by mouth daily.        . Iron-FA-B Cmp-C-Biot-Probiotic (FUSION PLUS) CAPS Take 1 capsule by mouth daily.  [place]  capsule  4  . Multiple Vitamins-Minerals (MULTIVITAMIN PO) Take by mouth daily.      . valsartan (DIOVAN) 320 MG tablet Take 320 mg by mouth daily.      Marland Kitchen BIOTIN PO Take 10,000 mcg by mouth daily.        No facility-administered medications prior to visit.    PAST MEDICAL HISTORY: Past Medical History  Diagnosis Date  . Hypertension   . Pulmonary embolism 2008    secondary to OCP  . Abnormal Pap smear 7/13    ASCUS/positive HR HPV,  negative colpo  . Anemia 2006  . Fibroids     with enlarged uterus  . 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 Myomectomy    . Oophorectomy      left  . Left finger reattachment  age 38    FAMILY HISTORY: Family History  Problem Relation Age of Onset  . Diabetes Mother   . Hypertension Mother   . Heart disease Mother     cardiomegaly  . Lupus Mother   . Diabetes Father   . Hypertension Father   . Stroke Father   . Hypertension Maternal Grandmother   . Stroke Maternal Grandmother   . Hypertension Sister   . Seizures Sister   . COPD Sister     SOCIAL HISTORY: History   Social History  . Marital Status: Single    Spouse Name: N/A    Number of Children: 0  . Years of Education: BA   Occupational History  . Warden/ranger    Social History Main Topics  . Smoking status: Never Smoker   . Smokeless tobacco: Never Used  . Alcohol Use: 0.5 oz/week    1 drink(s) per week     Comment: occ glass of wine  . Drug Use: No  . Sexual Activity: Yes    Partners: Male    Birth Control/ Protection: None   Other Topics Concern  . Not on file   Social History Narrative   Patient is single with no children.   Patient is right handed.   Patient has a BA degree.   Patient drinks 1 cup daily.      PHYSICAL EXAM  Filed Vitals:   01/04/14 1357  BP: 150/93  Pulse: 62  Height: 5' 4.75" (1.645 m)  Weight: 197 lb (89.359 kg)   Body mass index is 33.02 kg/(m^2).  Generalized: Well developed, in no acute distress   Neurological examination  Mentation: Alert oriented to time, place, history taking. Follows all commands speech and language fluent Cranial nerve II-XII: Pupils were equal round reactive to light. Extraocular movements were full, visual field were full on confrontational test. Facial sensation and strength were normal.  Head turning and shoulder shrug  were normal and symmetric. Motor:  The motor testing reveals 5 over 5 strength of all 4 extremities. Good symmetric motor tone is noted throughout.  Sensory: Sensory testing is intact to soft touch on all 4 extremities. No evidence of extinction is noted.  Coordination: Cerebellar testing reveals good finger-nose-finger and heel-to-shin bilaterally.  Gait and station: Gait is normal. Tandem gait is normal. Romberg is negative. No drift is seen.  Reflexes: Deep tendon reflexes are symmetric and normal bilaterally.    DIAGNOSTIC DATA (LABS, IMAGING, TESTING) - I reviewed patient records, labs, notes, testing and imaging myself where available.  Lab Results  Component Value Date   WBC 5.9  10/19/2013   HGB 10.4* 10/19/2013   HCT 30.8* 10/19/2013   MCV 81.9 10/19/2013   PLT 369 10/19/2013      Component Value Date/Time   NA 138 08/23/2013 1737   K 4.0 08/23/2013 1737   CL 99 08/23/2013 1737   CO2 28 08/23/2013 1737   GLUCOSE 100* 08/23/2013 1737   BUN 9 08/23/2013 1737   CREATININE 0.60 08/23/2013 1737   CREATININE  Value: 0.61 PERFORMED AT [facility] CORRECTED ON 08/04 AT 0909: PREVIOUSLY REPORTED AS .61 12/15/2008 1132   CALCIUM 9.7 08/23/2013 1737   PROT 9.3* 08/23/2013 1737   ALBUMIN 3.8 08/23/2013 1737   AST 19 08/23/2013 1737   ALT 18 08/23/2013 1737   ALKPHOS 75 08/23/2013 1737   BILITOT 0.2* 08/23/2013 1737   GFRNONAA >90 08/23/2013 1737   GFRAA >90 08/23/2013 1737   Lab Results  Component Value Date   CHOL  Value: 151        ATP III CLASSIFICATION:  <200     mg/dL   Desirable  200-239  mg/dL   Borderline High  >=240    mg/dL   High        12/13/2008   HDL 29* 12/13/2008   LDLCALC  Value: 101        Total Cholesterol/HDL:CHD Risk Coronary Heart Disease Risk Table                     Men   Women  1/2 Average Risk   3.4   3.3  Average Risk       5.0   4.4  2 X Average Risk   9.6   7.1  3 X Average Risk  23.4   11.0        Use the calculated Patient Ratio above and the CHD Risk Table to determine the patient's CHD Risk.        ATP III CLASSIFICATION (LDL):   <100     mg/dL   Optimal  100-129  mg/dL   Near or Above                    Optimal  130-159  mg/dL   Borderline  160-189  mg/dL   High  >190     mg/dL   Very High* 12/13/2008   TRIG 107 12/13/2008   CHOLHDL 5.2 12/13/2008   Lab Results  Component Value Date   HGBA1C 5.4 09/13/2013   No results found for this basename: VITAMINB12   Lab Results  Component Value Date   TSH 1.016 09/13/2013      ASSESSMENT AND PLAN 48 y.o. 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 here with:  1. Dizziness 2. Disturbance of skin sensation- left side of face 3. Fatigue  Patient tried the Topamax but stopped because she felt that it was not beneficial. She states that most of her symptoms started after her father had a stroke. She is unsure if her symptoms are stress related. I will give the patient a trial on Cymbalta. She will let us know after several weeks if she does not find this beneficial or perhaps the dose needs to be increased. She should follow-up in 4 months or sooner if needed.   Ward Givens, MSN, NP-C 01/04/2014, 2:05 PM [facility] [address], [place], [place] [address] [phone]  Note:  This document was prepared with digital dictation and possible smart phrase technology. Any transcriptional errors that result from this process are unintentional.

## 2014-01-04 NOTE — Patient Instructions (Signed)
Duloxetine delayed-release capsules What is this medicine? DULOXETINE (doo LOX e teen) is used to treat depression, anxiety, and different types of chronic pain. This medicine may be used for other purposes; ask your health care provider or pharmacist if you have questions. COMMON BRAND NAME(S): Cymbalta What should I tell my health care provider before I take this medicine? They need to know if you have any of these conditions: -bipolar disorder or a family history of bipolar disorder -glaucoma -kidney disease -liver disease -suicidal thoughts or a previous suicide attempt -taken medicines called MAOIs like Carbex, Eldepryl, Marplan, Nardil, and Parnate within 14 days -an unusual reaction to duloxetine, other medicines, foods, dyes, or preservatives -pregnant or trying to get pregnant -breast-feeding How should I use this medicine? Take this medicine by mouth with a glass of water. Follow the directions on the prescription label. Do not cut, crush or chew this medicine. You can take this medicine with or without food. Take your medicine at regular intervals. Do not take your medicine more often than directed. Do not stop taking this medicine suddenly except upon the advice of your doctor. Stopping this medicine too quickly may cause serious side effects or your condition may worsen. A special MedGuide will be given to you by the pharmacist with each prescription and refill. Be sure to read this information carefully each time. Talk to your pediatrician regarding the use of this medicine in children. While this drug may be prescribed for children as young as 7 years of age for selected conditions, precautions do apply. Overdosage: If you think you have taken too much of this medicine contact a poison control center or emergency room at once. NOTE: This medicine is only for you. Do not share this medicine with others. What if I miss a dose? If you miss a dose, take it as soon as you can. If it  is almost time for your next dose, take only that dose. Do not take double or extra doses. What may interact with this medicine? Do not take this medicine with any of the following medications: -certain diet drugs like dexfenfluramine, fenfluramine -desvenlafaxine -linezolid -MAOIs like Azilect, Carbex, Eldepryl, Marplan, Nardil, and Parnate -methylene blue (intravenous) -milnacipran -thioridazine -venlafaxine This medicine may also interact with the following medications: -alcohol -aspirin and aspirin-like medicines -certain antibiotics like ciprofloxacin and enoxacin -certain medicines for blood pressure, heart disease, irregular heart beat -certain medicines for depression, anxiety, or psychotic disturbances -certain medicines for migraine headache like almotriptan, eletriptan, frovatriptan, naratriptan, rizatriptan, sumatriptan, zolmitriptan -certain medicines that treat or prevent blood clots like warfarin, enoxaparin, and dalteparin -cimetidine -fentanyl -lithium -NSAIDS, medicines for pain and inflammation, like ibuprofen or naproxen -phentermine -procarbazine -sibutramine -St. John's wort -theophylline -tramadol -tryptophan This list may not describe all possible interactions. Give your health care provider a list of all the medicines, herbs, non-prescription drugs, or dietary supplements you use. Also tell them if you smoke, drink alcohol, or use illegal drugs. Some items may interact with your medicine. What should I watch for while using this medicine? Tell your doctor if your symptoms do not get better or if they get worse. Visit your doctor or health care professional for regular checks on your progress. Because it may take several weeks to see the full effects of this medicine, it is important to continue your treatment as prescribed by your doctor. Patients and their families should watch out for new or worsening thoughts of suicide or depression. Also watch out for  sudden changes in   feelings such as feeling anxious, agitated, panicky, irritable, hostile, aggressive, impulsive, severely restless, overly excited and hyperactive, or not being able to sleep. If this happens, especially at the beginning of treatment or after a change in dose, call your health care professional. You may get drowsy or dizzy. Do not drive, use machinery, or do anything that needs mental alertness until you know how this medicine affects you. Do not stand or sit up quickly, especially if you are an older patient. This reduces the risk of dizzy or fainting spells. Alcohol may interfere with the effect of this medicine. Avoid alcoholic drinks. This medicine can cause an increase in blood pressure. This medicine can also cause a sudden drop in your blood pressure, which may make you feel faint and increase the chance of a fall. These effects are most common when you first start the medicine or when the dose is increased, or during use of other medicines that can cause a sudden drop in blood pressure. Check with your doctor for instructions on monitoring your blood pressure while taking this medicine. Your mouth may get dry. Chewing sugarless gum or sucking hard candy, and drinking plenty of water may help. Contact your doctor if the problem does not go away or is severe. What side effects may I notice from receiving this medicine? Side effects that you should report to your doctor or health care professional as soon as possible: -allergic reactions like skin rash, itching or hives, swelling of the face, lips, or tongue -changes in blood pressure -confusion -dark urine -dizziness -fast talking and excited feelings or actions that are out of control -fast, irregular heartbeat -fever -general ill feeling or flu-like symptoms -hallucination, loss of contact with reality -light-colored stools -loss of balance or coordination -redness, blistering, peeling or loosening of the skin, including  inside the mouth -right upper belly pain -seizures -suicidal thoughts or other mood changes -trouble concentrating -trouble passing urine or change in the amount of urine -unusual bleeding or bruising -unusually weak or tired -yellowing of the eyes or skin Side effects that usually do not require medical attention (report to your doctor or health care professional if they continue or are bothersome): -blurred vision -change in appetite -change in sex drive or performance -headache -increased sweating -nausea This list may not describe all possible side effects. Call your doctor for medical advice about side effects. You may report side effects to FDA at 1-800-FDA-1088. Where should I keep my medicine? Keep out of the reach of children. Store at room temperature between 15 and 30 degrees C (59 and 86 degrees F). Throw away any unused medicine after the expiration date. NOTE: This sheet is a summary. It may not cover all possible information. If you have questions about this medicine, talk to your doctor, pharmacist, or health care provider.  2015, Elsevier/Gold Standard. (2013-04-24 14:20:31)  

## 2014-01-04 NOTE — Progress Notes (Signed)
I have read the note, and I agree with the clinical assessment and plan.  Yaa Donnellan KEITH   

## 2014-01-07 ENCOUNTER — Ambulatory Visit: Payer: BC Managed Care – PPO | Admitting: Nurse Practitioner

## 2014-01-07 ENCOUNTER — Telehealth: Payer: Self-pay | Admitting: Adult Health

## 2014-01-07 MED ORDER — TOPIRAMATE 25 MG PO TABS: ORAL_TABLET | ORAL | Status: DC

## 2014-01-07 NOTE — Telephone Encounter (Signed)
Patient's experiencing side effects of feeling high, jittery,and out of it after taking DULoxetine (CYMBALTA) 30 MG capsule.  Please call and advise anytime and may leave message if not available.

## 2014-01-07 NOTE — Telephone Encounter (Signed)
Patient can't tolerate the cymbalta. She doesn't like the way it makes her feel. She should stop the cymbalta. She would like to retry the topamax. She should take 25 mg at night for 1 week then increase to 50 mg at night (2 tablets). Patient verbalized understanding.

## 2014-03-18 ENCOUNTER — Encounter: Payer: Self-pay | Admitting: Adult Health

## 2014-03-19 DIAGNOSIS — N979 Female infertility, unspecified: Secondary | ICD-10-CM | POA: Insufficient documentation

## 2014-04-29 ENCOUNTER — Encounter: Payer: Self-pay | Admitting: Adult Health

## 2014-04-29 ENCOUNTER — Ambulatory Visit (INDEPENDENT_AMBULATORY_CARE_PROVIDER_SITE_OTHER): Payer: BC Managed Care – PPO | Admitting: Adult Health

## 2014-04-29 VITALS — BP 133/79 | HR 84 | Temp 98.5°F | Ht 65.0 in | Wt 201.0 lb

## 2014-04-29 DIAGNOSIS — R209 Unspecified disturbances of skin sensation: Secondary | ICD-10-CM

## 2014-04-29 DIAGNOSIS — R42 Dizziness and giddiness: Secondary | ICD-10-CM

## 2014-04-29 MED ORDER — TOPIRAMATE 50 MG PO TABS: ORAL_TABLET | ORAL | Status: DC

## 2014-04-29 NOTE — Progress Notes (Signed)
I have read the note, and I agree with the clinical assessment and plan.  Moody Robben KEITH   

## 2014-04-29 NOTE — Progress Notes (Signed)
PATIENT: [name] DOB: April 03, 1966  REASON FOR VISIT: follow up HISTORY FROM: patient  HISTORY OF PRESENT ILLNESS: Ms. [name] is a 48 year old female with a history of dizziness and pressure in her head. She returns today for follow-up. She is currently taking Topamax 50 mg BID and tolerating it well. She states that the dizziness and pressure sensation in her head have resolved. She denies having the sensory changes on the left side of the face. She states that she has ear pressure but it does not happen all the time. She started taking allegra and that has seemed to help. She has been seen by an ENT for pressure and ringing in the ears but exam was unremarkable. Overall she feels that she is doing much better.  HISTORY 01/04/14: 48 year old black female with a history of dizziness and pressure in her head. She returns today for follow-up. 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 She was started on Topamax but she stopped taking it. She continues to have dizziness that she describes as lightheaded and fogginess. She also had some ringing in the ears. She saw ENT for the ringing in the ears but everything was unremarkable. She does have some sinus issues and her PCP put her on allegra and nasonex. She states that she initially felt better on the allegra but soon after her symptoms returned. She states that the pressure in her head and tightness in her head is intermittent. She states that at times she has what feels like numbness on the left side of her face and a tightness. She states that she feels fatigued throughout the day. She states that she has not had any new neurological symptoms since the last visit. She does state that she noticed that her symptoms began after her Father's stroke. She is unsure if her symptoms are related to stress.   REVIEW OF SYSTEMS: Out of a complete 14 system review of symptoms, the patient complains only of the following symptoms, and all other  reviewed systems are negative.  ALLERGIES: Allergies  Allergen Reactions  . Minocycline Hcl Hives and Itching  . Sulfa Antibiotics Swelling    HOME MEDICATIONS: Outpatient Prescriptions Prior to Visit  Medication Sig Dispense Refill  . Dexlansoprazole (DEXILANT PO) Take 60 mg by mouth daily.     . fexofenadine (ALLEGRA) 180 MG tablet Take 180 mg by mouth daily.    . hydrochlorothiazide 25 MG tablet Take 25 mg by mouth daily.      . Iron-FA-B Cmp-C-Biot-Probiotic (FUSION PLUS) CAPS Take 1 capsule by mouth daily. 90 capsule 4  . mometasone (NASONEX) 50 MCG/ACT nasal spray Place 2 sprays into the nose daily.    . Multiple Vitamins-Minerals (MULTIVITAMIN PO) Take by mouth daily.    Marland Kitchen topiramate (TOPAMAX) 25 MG tablet Take 1 tablet at night for 1 week then increase to 2 tablets at night thereafter. 60 tablet 0  . valsartan (DIOVAN) 320 MG tablet Take 320 mg by mouth daily.     No facility-administered medications prior to visit.    PAST MEDICAL HISTORY: Past Medical History  Diagnosis Date  . Hypertension   . Pulmonary embolism 2008    secondary to OCP  . Abnormal Pap smear 7/13    ASCUS/positive HR HPV, negative colpo  . Anemia 2006  . Fibroids     with enlarged uterus  . Condylomata acuminata in female     PAST SURGICAL HISTORY: Past Surgical History  Procedure Laterality  Date  . Myomectomy    . Oophorectomy      left  . Left finger reattachment  age 31    FAMILY HISTORY: Family History  Problem Relation Age of Onset  . Diabetes Mother   . Hypertension Mother   . Heart disease Mother     cardiomegaly  . Lupus Mother   . Diabetes Father   . Hypertension Father   . Stroke Father   . Hypertension Maternal Grandmother   . Stroke Maternal Grandmother   . Hypertension Sister   . Seizures Sister   . COPD Sister     SOCIAL HISTORY: History   Social History  . Marital Status: Single    Spouse Name: N/A    Number of Children: 0  . Years of Education: BA    Occupational History  . Warden/ranger    Social History Main Topics  . Smoking status: Never Smoker   . Smokeless tobacco: Never Used  . Alcohol Use: 0.5 oz/week    1 drink(s) per week     Comment: occ glass of wine  . Drug Use: No  . Sexual Activity:    Partners: Male    Birth Control/ Protection: None   Other Topics Concern  . Not on file   Social History Narrative   Patient is single with no children.   Patient is right handed.   Patient has a BA degree.   Patient drinks 1 cup daily.      PHYSICAL EXAM  Filed Vitals:   04/29/14 0820  BP: 133/79  Pulse: 84  Temp: 98.5 F (36.9 C)  TempSrc: Oral  Height: 5\' 5"  (1.651 m)  Weight: 201 lb (91.173 kg)   Body mass index is 33.45 kg/(m^2).  Generalized: Well developed, in no acute distress  Ears: Ear canal is normal bilaterally, tympanic membrane intact bilaterally.  Neurological examination  Mentation: Alert oriented to time, place, history taking. Follows all commands speech and language fluent Cranial nerve II-XII: Pupils were equal round reactive to light. Extraocular movements were full, visual field were full on confrontational test. Facial sensation and strength were normal. Uvula tongue midline. Head turning and shoulder shrug  were normal and symmetric. Motor: The motor testing reveals 5 over 5 strength of all 4 extremities. Good symmetric motor tone is noted throughout.  Sensory: Sensory testing is intact to soft touch on all 4 extremities. No evidence of extinction is noted.  Coordination: Cerebellar testing reveals good finger-nose-finger and heel-to-shin bilaterally.  Gait and station: Gait is normal. Tandem gait is normal. Romberg is negative. No drift is seen.  Reflexes: Deep tendon reflexes are symmetric and normal bilaterally.    DIAGNOSTIC DATA (LABS, IMAGING, TESTING) - I reviewed patient records, labs, notes, testing and imaging myself where available.  Lab Results  Component Value Date    WBC 5.9 10/19/2013   HGB 10.4* 10/19/2013   HCT 30.8* 10/19/2013   MCV 81.9 10/19/2013   PLT 369 10/19/2013      Component Value Date/Time   NA 138 08/23/2013 1737   K 4.0 08/23/2013 1737   CL 99 08/23/2013 1737   CO2 28 08/23/2013 1737   GLUCOSE 100* 08/23/2013 1737   BUN 9 08/23/2013 1737   CREATININE 0.60 08/23/2013 1737   CREATININE  12/15/2008 1132    0.61 PERFORMED AT [facility] CORRECTED ON 08/04 AT 0909: PREVIOUSLY REPORTED AS .61   CALCIUM 9.7 08/23/2013 1737   PROT 9.3* 08/23/2013 1737   ALBUMIN 3.8 08/23/2013 1737  AST 19 08/23/2013 1737   ALT 18 08/23/2013 1737   ALKPHOS 75 08/23/2013 1737   BILITOT 0.2* 08/23/2013 1737   GFRNONAA >90 08/23/2013 1737   GFRAA >90 08/23/2013 1737   Lab Results  Component Value Date   CHOL  12/13/2008    151        ATP III CLASSIFICATION:  <200     mg/dL   Desirable  200-239  mg/dL   Borderline High  >=240    mg/dL   High          HDL 29* 12/13/2008   LDLCALC * 12/13/2008    101        Total Cholesterol/HDL:CHD Risk Coronary Heart Disease Risk Table                     Men   Women  1/2 Average Risk   3.4   3.3  Average Risk       5.0   4.4  2 X Average Risk   9.6   7.1  3 X Average Risk  23.4   11.0        Use the calculated Patient Ratio above and the CHD Risk Table to determine the patient's CHD Risk.        ATP III CLASSIFICATION (LDL):  <100     mg/dL   Optimal  100-129  mg/dL   Near or Above                    Optimal  130-159  mg/dL   Borderline  160-189  mg/dL   High  >190     mg/dL   Very High   TRIG 107 12/13/2008   CHOLHDL 5.2 12/13/2008   Lab Results  Component Value Date   HGBA1C 5.4 09/13/2013   No results found for: VITAMINB12 Lab Results  Component Value Date   TSH 1.016 09/13/2013      ASSESSMENT AND PLAN 48 y.o. 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  here with:  1. Dizziness 2. Disturbance of skin sensation  Overall the patient has improved. She will continue taking Topamax 50 mg at bedtime. I will refill today. The patient is considering getting a second opinion from another ENT regarding her ear pressure. If the patient's symptoms worsen or she develops new symptoms she should let us know. She will follow-up in 6 months or sooner if needed.  Ward Givens, MSN, NP-C 04/29/2014, 8:32 AM [facility] [address], [place], [place] [address] [phone]  Note: This document was prepared with digital dictation and possible smart phrase technology. Any transcriptional errors that result from this process are unintentional.

## 2014-04-29 NOTE — Patient Instructions (Signed)
Continue taking Topamax 50 mg at bedtime.  If your symptoms worsen or you develop new symptoms please let us know.    Topiramate tablets What is this medicine? TOPIRAMATE (toe PYRE a mate) is used to treat seizures in adults or children with epilepsy. It is also used for the prevention of migraine headaches. This medicine may be used for other purposes; ask your health care provider or pharmacist if you have questions. COMMON BRAND NAME(S): Topamax, Topiragen What should I tell my health care provider before I take this medicine? They need to know if you have any of these conditions: -bleeding disorders -cirrhosis of the liver or liver disease -diarrhea -glaucoma -kidney stones or kidney disease -low blood counts, like low white cell, platelet, or red cell counts -lung disease like asthma, obstructive pulmonary disease, emphysema -metabolic acidosis -on a ketogenic diet -schedule for surgery or a procedure -suicidal thoughts, plans, or attempt; a previous suicide attempt by you or a family member -an unusual or allergic reaction to topiramate, other medicines, foods, dyes, or preservatives -pregnant or trying to get pregnant -breast-feeding How should I use this medicine? Take this medicine by mouth with a glass of water. Follow the directions on the prescription label. Do not crush or chew. You may take this medicine with meals. Take your medicine at regular intervals. Do not take it more often than directed. Talk to your pediatrician regarding the use of this medicine in children. Special care may be needed. While this drug may be prescribed for children as young as 8 years of age for selected conditions, precautions do apply. Overdosage: If you think you have taken too much of this medicine contact a poison control center or emergency room at once. NOTE: This medicine is only for you. Do not share this medicine with others. What if I miss a dose? If you miss a dose, take it as soon  as you can. If your next dose is to be taken in less than 6 hours, then do not take the missed dose. Take the next dose at your regular time. Do not take double or extra doses. What may interact with this medicine? Do not take this medicine with any of the following medications: -probenecid This medicine may also interact with the following medications: -acetazolamide -alcohol -amitriptyline -aspirin and aspirin-like medicines -birth control pills -certain medicines for depression -certain medicines for seizures -certain medicines that treat or prevent blood clots like warfarin, enoxaparin, dalteparin, apixaban, dabigatran, and rivaroxaban -digoxin -hydrochlorothiazide -lithium -medicines for pain, sleep, or muscle relaxation -metformin -methazolamide -NSAIDS, medicines for pain and inflammation, like ibuprofen or naproxen -pioglitazone -risperidone This list may not describe all possible interactions. Give your health care provider a list of all the medicines, herbs, non-prescription drugs, or dietary supplements you use. Also tell them if you smoke, drink alcohol, or use illegal drugs. Some items may interact with your medicine. What should I watch for while using this medicine? Visit your doctor or health care professional for regular checks on your progress. Do not stop taking this medicine suddenly. This increases the risk of seizures if you are using this medicine to control epilepsy. Wear a medical identification bracelet or chain to say you have epilepsy or seizures, and carry a card that lists all your medicines. This medicine can decrease sweating and increase your body temperature. Watch for signs of deceased sweating or fever, especially in children. Avoid extreme heat, hot baths, and saunas. Be careful about exercising, especially in hot weather. Contact your  health care provider right away if you notice a fever or decrease in sweating. You should drink plenty of fluids while  taking this medicine. If you have had kidney stones in the past, this will help to reduce your chances of forming kidney stones. If you have stomach pain, with nausea or vomiting and yellowing of your eyes or skin, call your doctor immediately. You may get drowsy, dizzy, or have blurred vision. Do not drive, use machinery, or do anything that needs mental alertness until you know how this medicine affects you. To reduce dizziness, do not sit or stand up quickly, especially if you are an older patient. Alcohol can increase drowsiness and dizziness. Avoid alcoholic drinks. If you notice blurred vision, eye pain, or other eye problems, seek medical attention at once for an eye exam. The use of this medicine may increase the chance of suicidal thoughts or actions. Pay special attention to how you are responding while on this medicine. Any worsening of mood, or thoughts of suicide or dying should be reported to your health care professional right away. This medicine may increase the chance of developing metabolic acidosis. If left untreated, this can cause kidney stones, bone disease, or slowed growth in children. Symptoms include breathing fast, fatigue, loss of appetite, irregular heartbeat, or loss of consciousness. Call your doctor immediately if you experience any of these side effects. Also, tell your doctor about any surgery you plan on having while taking this medicine since this may increase your risk for metabolic acidosis. Birth control pills may not work properly while you are taking this medicine. Talk to your doctor about using an extra method of birth control. Women who become pregnant while using this medicine may enroll in the [place] Pregnancy Registry by calling [phone]. This registry collects information about the safety of antiepileptic drug use during pregnancy. What side effects may I notice from receiving this medicine? Side effects that you should report  to your doctor or health care professional as soon as possible: -allergic reactions like skin rash, itching or hives, swelling of the face, lips, or tongue -decreased sweating and/or rise in body temperature -depression -difficulty breathing, fast or irregular breathing patterns -difficulty speaking -difficulty walking or controlling muscle movements -hearing impairment -redness, blistering, peeling or loosening of the skin, including inside the mouth -tingling, pain or numbness in the hands or feet -unusual bleeding or bruising -unusually weak or tired -worsening of mood, thoughts or actions of suicide or dying Side effects that usually do not require medical attention (report to your doctor or health care professional if they continue or are bothersome): -altered taste -back pain, joint or muscle aches and pains -diarrhea, or constipation -headache -loss of appetite -nausea -stomach upset, indigestion -tremors This list may not describe all possible side effects. Call your doctor for medical advice about side effects. You may report side effects to FDA at 1-800-FDA-1088. Where should I keep my medicine? Keep out of the reach of children. Store at room temperature between 15 and 30 degrees C (59 and 86 degrees F) in a tightly closed container. Protect from moisture. Throw away any unused medicine after the expiration date. NOTE: This sheet is a summary. It may not cover all possible information. If you have questions about this medicine, talk to your doctor, pharmacist, or health care provider.  2015, Elsevier/Gold Standard. (2013-05-07 23:17:57)

## 2014-07-04 ENCOUNTER — Other Ambulatory Visit: Payer: Self-pay

## 2014-07-04 DIAGNOSIS — Z1231 Encounter for screening mammogram for malignant neoplasm of breast: Secondary | ICD-10-CM

## 2014-07-05 ENCOUNTER — Ambulatory Visit
Admission: RE | Admit: 2014-07-05 | Discharge: 2014-07-05 | Disposition: A | Discharge status: Home / Self Care | Payer: BLUE CROSS/BLUE SHIELD | Source: Ambulatory Visit

## 2014-07-05 DIAGNOSIS — Z1231 Encounter for screening mammogram for malignant neoplasm of breast: Secondary | ICD-10-CM

## 2014-07-30 ENCOUNTER — Telehealth: Payer: Self-pay | Admitting: Adult Health

## 2014-07-30 NOTE — Telephone Encounter (Signed)
Pt is calling stating she has questions regarding the amount prescribed for her topiramate (TOPAMAX) 50 MG tablet, she thought she was suppose to be taking 25mg .  Please call and advise.

## 2014-07-31 MED ORDER — TOPIRAMATE 50 MG PO TABS: 50.0000 mg | ORAL_TABLET | Freq: Every day | ORAL | Status: DC

## 2014-07-31 NOTE — Telephone Encounter (Signed)
I called the patient she should be taken 50 mg daily at bedtime. I adjusted her prescription to reflect this.

## 2014-09-04 ENCOUNTER — Other Ambulatory Visit: Payer: Self-pay | Admitting: *Deleted

## 2014-09-04 NOTE — Telephone Encounter (Signed)
Medication refill request: Fusion plus caps Last AEX:  12/04/12 DL Next AEX: none Last MMG (if hormonal medication request): 07/05/14 [email or web address]:neg Refill authorized: 02/23/13 #90 caps/ 4 Refills. Today please advise.  Called pt. Left voicemail to call back.

## 2014-09-05 NOTE — Telephone Encounter (Signed)
Will not refill, needs aex and labs drawn

## 2014-09-10 ENCOUNTER — Telehealth: Payer: Self-pay | Admitting: Certified Nurse Midwife

## 2014-09-10 DIAGNOSIS — D509 Iron deficiency anemia, unspecified: Secondary | ICD-10-CM

## 2014-09-10 NOTE — Telephone Encounter (Signed)
Pt called to make aex. Scheduled for next available with D.Leonard 12/05/14. Pt requests refill until aex.  [facility]

## 2014-09-10 NOTE — Telephone Encounter (Signed)
Pt called to schedule aex. Scheduled with D.Leonard for next available 12/05/14. Added to wait list for Debbi and Dr Sabra Heck.   Pt requests refill until aex.  [facility]  (previous message closed in error)

## 2014-09-10 NOTE — Telephone Encounter (Signed)
Medication refill request:  Last AEX:  12/04/12 DL Next AEX: 12/05/14 DL Last MMG (if hormonal medication request): 07/08/14 BIRADS1:Neg Refill authorized: 02/23/13 #90caps/ 4 Refills  Please advise.

## 2014-09-11 MED ORDER — FUSION PLUS PO CAPS: 1.0000 | ORAL_CAPSULE | Freq: Every day | ORAL | Status: DC

## 2014-10-28 ENCOUNTER — Ambulatory Visit (INDEPENDENT_AMBULATORY_CARE_PROVIDER_SITE_OTHER): Payer: BLUE CROSS/BLUE SHIELD | Admitting: Certified Nurse Midwife

## 2014-10-28 ENCOUNTER — Encounter: Payer: Self-pay | Admitting: Certified Nurse Midwife

## 2014-10-28 VITALS — BP 104/70 | HR 70 | Resp 20 | Ht 64.5 in | Wt 200.0 lb

## 2014-10-28 DIAGNOSIS — Z124 Encounter for screening for malignant neoplasm of cervix: Secondary | ICD-10-CM

## 2014-10-28 DIAGNOSIS — N852 Hypertrophy of uterus: Secondary | ICD-10-CM | POA: Diagnosis not present

## 2014-10-28 DIAGNOSIS — Z0000 Encounter for general adult medical examination without abnormal findings: Secondary | ICD-10-CM

## 2014-10-28 DIAGNOSIS — Z8742 Personal history of other diseases of the female genital tract: Secondary | ICD-10-CM

## 2014-10-28 DIAGNOSIS — Z01419 Encounter for gynecological examination (general) (routine) without abnormal findings: Secondary | ICD-10-CM | POA: Diagnosis not present

## 2014-10-28 DIAGNOSIS — Z86018 Personal history of other benign neoplasm: Secondary | ICD-10-CM

## 2014-10-28 DIAGNOSIS — D219 Benign neoplasm of connective and other soft tissue, unspecified: Secondary | ICD-10-CM | POA: Insufficient documentation

## 2014-10-28 LAB — POCT URINALYSIS DIPSTICK
BILIRUBIN UA: NEGATIVE
Blood, UA: NEGATIVE
GLUCOSE UA: NEGATIVE
Ketones, UA: NEGATIVE
Leukocytes, UA: NEGATIVE
Nitrite, UA: NEGATIVE
PROTEIN UA: NEGATIVE
UROBILINOGEN UA: NEGATIVE
pH, UA: 5

## 2014-10-28 LAB — HEPATITIS C ANTIBODY: HCV AB: NEGATIVE

## 2014-10-28 NOTE — Patient Instructions (Signed)

## 2014-10-28 NOTE — Progress Notes (Signed)
49 y.o. G3P0030 partnered  African American Fe here for annual exam. Periods normal, no issues. Continues with Fusion Plus and PCP management of aex/labs has appointment soon.. Getting married 03-01-15!! STD screening desired. Feels like fibroids may have changed, but cycles still same. Has pain on right side prior to menses each month. No other concerns. No reoccurrence of condyloma.. No health issues.  Patient's last menstrual period was 10/04/2014.          Sexually active: Yes.    The current method of family planning is none.    Exercising: Yes.    walking Smoker:  no  Health Maintenance: Pap: 12-04-12 neg HPV HR neg MMG:  06/2014 neg  Density C, birads negative 1 had 3 D. Colonoscopy:  none BMD:   none TDaP:  2014 Labs: poct urine-neg, hgb-10.5 Self breast exam: done occ   reports that she has never smoked. She has never used smokeless tobacco. She reports that she drinks alcohol. She reports that she does not use illicit drugs.  Past Medical History  Diagnosis Date  . Hypertension   . Pulmonary embolism 2008    secondary to OCP  . Abnormal Pap smear 7/13    ASCUS/positive HR HPV, negative colpo  . Anemia 2006  . Fibroids     with enlarged uterus  . 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 Myomectomy    . Oophorectomy      left  . Left finger reattachment  age 63    Current Outpatient Prescriptions  Medication Sig Dispense Refill  . Cyanocobalamin (B-12 PO) Take by mouth daily.    Marland Kitchen DEXILANT 60 MG capsule     . fexofenadine (ALLEGRA) 180 MG tablet Take 180 mg by mouth daily.    . hydrochlorothiazide 25 MG tablet Take 25 mg by mouth daily.      . Iron-FA-B Cmp-C-Biot-Probiotic (FUSION PLUS) CAPS Take 1 capsule by mouth daily. 90 capsule 0  . loratadine (CLARITIN) 10 MG tablet Take 10 mg by mouth daily.    . Multiple Vitamins-Minerals (MULTIVITAMIN PO) Take by mouth daily.    Marland Kitchen topiramate (TOPAMAX) 50 MG tablet Take 1  tablet (50 mg total) by mouth daily. 30 tablet 5  . valsartan (DIOVAN) 320 MG tablet Take 320 mg by mouth daily.     No current facility-administered medications for this visit.    Family History  Problem Relation Age of Onset  . Diabetes Mother   . Hypertension Mother   . Heart disease Mother     cardiomegaly  . Lupus Mother   . Diabetes Father   . Hypertension Father   . Stroke Father   . Hypertension Maternal Grandmother   . Stroke Maternal Grandmother   . Hypertension Sister   . Seizures Sister   . COPD Sister     ROS:  Pertinent items are noted in HPI.  Otherwise, a comprehensive ROS was negative.  Exam:   BP 104/70 mmHg  Pulse 70  Resp 20  Ht 5' 4.5" (1.638 m)  Wt 200 lb (90.719 kg)  BMI 33.81 kg/m2  LMP 10/04/2014 Height: 5' 4.5" (163.8 cm) Ht Readings from Last 3 Encounters:  10/28/14 5' 4.5" (1.638 m)  04/29/14 5\' 5"  (1.651 m)  01/04/14 5' 4.75" (1.645 m)    General appearance: alert, cooperative and appears stated age Head: Normocephalic, without obvious abnormality, atraumatic Neck: no adenopathy, supple, symmetrical, trachea midline and thyroid normal to inspection  and palpation Lungs: clear to auscultation bilaterally Breasts: normal appearance, no masses or tenderness, No nipple retraction or dimpling, No nipple discharge or bleeding, No axillary or supraclavicular adenopathy Heart: regular rate and rhythm Abdomen: soft, non-tender; no masses,  no organomegaly Extremities: extremities normal, atraumatic, no cyanosis or edema Skin: Skin color, texture, turgor normal. No rashes or lesions Lymph nodes: Cervical, supraclavicular, and axillary nodes normal. No abnormal inguinal nodes palpated Neurologic: Grossly normal   Pelvic: External genitalia:  no lesions              Urethra:  normal appearing urethra with no masses, tenderness or lesions              Bartholin's and Skene's: normal                 Vagina: normal appearing vagina with normal  color and discharge, no lesions              Cervix: normal, non tender, no lesions              Pap taken: Yes.   Bimanual Exam:  Uterus:  enlarged, 24  weeks size history of fibroids, palpated .              Adnexa: normal adnexa and no mass, fullness, tenderness               Rectovaginal: Confirms               Anus:  normal sphincter tone, no lesions  Chaperone present: Yes  A:  Well Woman with normal exam  Contraception  None  History of fibroids ? Change  Anemia under PCP management on iron supplement  STD screening  P:   Reviewed health and wellness pertinent to exam  Discussed fibroids discomfort on right side and uterine size change and need for evaluation with PUS. Agreeable to PUS. Also unable to feel right adnexae due to fibroid and need to evaluate also.Patient aware of that she will be contacted with insurance info and scheduled.  Continue MD follow up as indicated  Labs:GC,Chlamydia,HIV,RPR, Hep B,C HSV 1,2  Pap smear taken with HPVHR   counseled on breast self exam, adequate intake of calcium and vitamin D, diet and exercise  return annually or prn  An After Visit Summary was printed and given to the patient.

## 2014-10-28 NOTE — Progress Notes (Signed)
Reviewed personally.  M. Suzanne Nicholis Stepanek, MD.  

## 2014-10-29 LAB — HIV ANTIBODY (ROUTINE TESTING W REFLEX): HIV 1&2 Ab, 4th Generation: NONREACTIVE

## 2014-10-29 LAB — IPS PAP TEST WITH REFLEX TO HPV

## 2014-10-29 LAB — RPR

## 2014-10-29 LAB — HEMOGLOBIN, FINGERSTICK: Hemoglobin, fingerstick: 10.5 g/dL — ABNORMAL LOW (ref 12.0–16.0)

## 2014-10-29 LAB — HSV(HERPES SIMPLEX VRS) I + II AB-IGG
HSV 1 Glycoprotein G Ab, IgG: 0.3 IV
HSV 2 Glycoprotein G Ab, IgG: 10.22 IV — ABNORMAL HIGH

## 2014-10-30 LAB — IPS N GONORRHOEA AND CHLAMYDIA BY PCR

## 2014-10-31 ENCOUNTER — Telehealth: Payer: Self-pay | Admitting: Certified Nurse Midwife

## 2014-10-31 DIAGNOSIS — Z86018 Personal history of other benign neoplasm: Secondary | ICD-10-CM

## 2014-10-31 NOTE — Telephone Encounter (Signed)
Left message to call back. Call was placed to discuss lab results.

## 2014-10-31 NOTE — Telephone Encounter (Signed)
Patient returned [name] call.

## 2014-10-31 NOTE — Telephone Encounter (Signed)
Notified patient of negative HIV, RPR, GC,Chlamydia, Hep B,Cand negative HSV 1. 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 If she feel she has an area of concern advised to have OV. Patient to do her own research CDC website, and will call if other questions.  No other questions.

## 2014-11-05 ENCOUNTER — Ambulatory Visit (INDEPENDENT_AMBULATORY_CARE_PROVIDER_SITE_OTHER): Payer: BLUE CROSS/BLUE SHIELD | Admitting: Obstetrics & Gynecology

## 2014-11-05 ENCOUNTER — Other Ambulatory Visit: Payer: Self-pay | Admitting: Obstetrics & Gynecology

## 2014-11-05 ENCOUNTER — Ambulatory Visit (INDEPENDENT_AMBULATORY_CARE_PROVIDER_SITE_OTHER): Payer: BLUE CROSS/BLUE SHIELD

## 2014-11-05 VITALS — BP 128/84 | Ht 64.5 in | Wt 198.6 lb

## 2014-11-05 DIAGNOSIS — Z86018 Personal history of other benign neoplasm: Secondary | ICD-10-CM

## 2014-11-05 DIAGNOSIS — D251 Intramural leiomyoma of uterus: Secondary | ICD-10-CM | POA: Diagnosis not present

## 2014-11-05 DIAGNOSIS — R102 Pelvic and perineal pain: Secondary | ICD-10-CM | POA: Diagnosis not present

## 2014-11-05 DIAGNOSIS — Z8742 Personal history of other diseases of the female genital tract: Secondary | ICD-10-CM

## 2014-11-05 DIAGNOSIS — N852 Hypertrophy of uterus: Secondary | ICD-10-CM

## 2014-11-05 DIAGNOSIS — D509 Iron deficiency anemia, unspecified: Secondary | ICD-10-CM | POA: Diagnosis not present

## 2014-11-05 NOTE — Addendum Note (Signed)
Addended by: Michele Mcalpine on: 11/05/2014 08:36 AM   Modules accepted: Orders

## 2014-11-05 NOTE — Progress Notes (Signed)
49 y.o. G3P3 Single AA female here for a pelvic ultrasound due to known fibroids, enlarged uterus, and now some increased pelvic pressure.  Cycles are regular.  Pt with hx of PE on OCPs in the past.      Patient's last menstrual period was 10/04/2014.  Sexually active:  yes  Contraception: no method  FINDINGS: UTERUS: significantly enlarged to 16.5 x 10.5 x 13.5cm with multiple fibroids, largest 6.0cm.  Other fibroids, 4.8cm, 3.0cm, 3.0cm, 5.9cm, 2.2cm, 4.0cm, 2.4cm, 2.0cm, 4.0cm EMS: 5.36mm ADNEXA:   Left ovary surgically absent   Right ovary 4.3 x 3.0 x 2.9cm with 3.2cm simple cyst CUL DE SAC: no free fluid noted  Reviewed findings with pt.  She is not interested in surgery at this time.  H/O PE while on OCPs.  Aware would need Lovenox post operatively.  Reviewed [place] with pt.  This is something she might consider.  Would like symptoms to improve but doesn't want anything with long recovery.  Information provided regarding [place].  Referral will be made for pt to interventional radiology.  Pt aware often 6-12 months before full benefits from procedure are reached.  Also risks of DUB, pain with procedure reviewed.    Assessment:  Significantly enlarged uterus due to fibroids H/o PE on OCPs Anemia prob due to chronic blood loss, on iron  Plan: Referral to interventional radiology will be make for [place] consultation.   ~20 minutes spent with patient >50% of time was in face to face discussion of above.

## 2014-11-19 ENCOUNTER — Encounter: Payer: Self-pay | Admitting: Obstetrics & Gynecology

## 2014-11-21 ENCOUNTER — Telehealth: Payer: Self-pay | Admitting: Obstetrics & Gynecology

## 2014-11-21 ENCOUNTER — Other Ambulatory Visit: Payer: Self-pay | Admitting: Obstetrics & Gynecology

## 2014-11-21 DIAGNOSIS — D259 Leiomyoma of uterus, unspecified: Secondary | ICD-10-CM

## 2014-11-21 NOTE — Telephone Encounter (Signed)
Spoke with patient. Agreeable to appointment information. Ok to close

## 2014-11-21 NOTE — Telephone Encounter (Signed)
Left voicemail regarding referral appointment. The information is listed below. Should the patient need to cancel or reschedule this appointment, please advise them to call the office they've been referred to in order to reschedule.  11/27/14 at [facility] to see Dr Barbie Banner. Arrive @ 845 for 9am appointment [place] [phone]

## 2014-11-25 ENCOUNTER — Ambulatory Visit: Payer: BC Managed Care – PPO | Admitting: Adult Health

## 2014-11-27 ENCOUNTER — Other Ambulatory Visit: Payer: BLUE CROSS/BLUE SHIELD

## 2014-11-27 ENCOUNTER — Ambulatory Visit (INDEPENDENT_AMBULATORY_CARE_PROVIDER_SITE_OTHER): Payer: BLUE CROSS/BLUE SHIELD | Admitting: Adult Health

## 2014-11-27 ENCOUNTER — Telehealth: Payer: Self-pay

## 2014-11-27 ENCOUNTER — Encounter: Payer: Self-pay | Admitting: Adult Health

## 2014-11-27 VITALS — BP 125/80 | HR 80 | Ht 64.0 in | Wt 195.0 lb

## 2014-11-27 DIAGNOSIS — R209 Unspecified disturbances of skin sensation: Secondary | ICD-10-CM | POA: Diagnosis not present

## 2014-11-27 DIAGNOSIS — R51 Headache: Secondary | ICD-10-CM | POA: Diagnosis not present

## 2014-11-27 DIAGNOSIS — R519 Headache, unspecified: Secondary | ICD-10-CM

## 2014-11-27 DIAGNOSIS — R42 Dizziness and giddiness: Secondary | ICD-10-CM | POA: Diagnosis not present

## 2014-11-27 NOTE — Patient Instructions (Signed)
Continue Topamax 50 mg bedtime.  If your symptoms worsen or you develop new symptoms please let us know.

## 2014-11-27 NOTE — Progress Notes (Signed)
I have read the note, and I agree with the clinical assessment and plan.  WILLIS,CHARLES KEITH   

## 2014-11-27 NOTE — Progress Notes (Signed)
PATIENT: [name] DOB: [birth year]  REASON FOR VISIT: follow up HISTORY FROM: patient  HISTORY OF PRESENT ILLNESS: Ms. [name] is a 49 year old female with a history of dizziness and pressure sensation in the head. She returns today for follow-up. She continues to take Topamax 50 mg at bedtime. She states that she no longer has the dizziness or pressure sensation. Also stopped taking Allegra and these symptoms did not return. Patient states that occasionally she'll have burning tingling sensations on different parts of the body. She states this may occur 1-2 times a week. Patient states that she is under a lot of stress currently. She is taking care of her [name], working a full-time job and planning a wedding. In the past she's had sensory changes on the left side of the face that was exacerbated with high stress. Patient denies any other neurological symptoms. She returns today for an evaluation.  HISTORY 04/29/14: Ms. [name] is a 49 year old female with a history of dizziness and pressure in her head. She returns today for follow-up. She is currently taking Topamax 50 mg BID and tolerating it well. She states that the dizziness and pressure sensation in her head have resolved. She denies having the sensory changes on the left side of the face. She states that she has ear pressure but it does not happen all the time. She started taking allegra and that has seemed to help. She has been seen by an ENT for pressure and ringing in the ears but exam was unremarkable. Overall she feels that she is doing much better.  HISTORY 01/04/14: 49 year old black female with a history of dizziness and pressure in her head. She returns today for follow-up. 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 She was started on Topamax but she stopped taking it. She continues to have dizziness that she describes as lightheaded and fogginess. She also had some ringing in the ears. She saw ENT for the ringing in  the ears but everything was unremarkable. She does have some sinus issues and her PCP put her on allegra and nasonex. She states that she initially felt better on the allegra but soon after her symptoms returned. She states that the pressure in her head and tightness in her head is intermittent. She states that at times she has what feels like numbness on the left side of her face and a tightness. She states that she feels fatigued throughout the day. She states that she has not had any new neurological symptoms since the last visit. She does state that she noticed that her symptoms began after her [name]'s stroke. She is unsure if her symptoms are related to stress.  REVIEW OF SYSTEMS: Out of a complete 14 system review of symptoms, the patient complains only of the following symptoms, and all other reviewed systems are negative.  Ringing in ears, anemia, decreased concentration  ALLERGIES: Allergies  Allergen Reactions  . Minocycline Hcl Hives and Itching  . Sulfa Antibiotics Swelling    HOME MEDICATIONS: Outpatient Prescriptions Prior to Visit  Medication Sig Dispense Refill  . Cyanocobalamin (B-12 PO) Take by mouth daily.    Marland Kitchen DEXILANT 60 MG capsule     . fexofenadine (ALLEGRA) 180 MG tablet Take 180 mg by mouth as needed.     . hydrochlorothiazide 25 MG tablet Take 25 mg by mouth daily.      . Iron-FA-B Cmp-C-Biot-Probiotic (FUSION PLUS) CAPS Take 1 capsule by mouth daily. [place]  capsule 0  . loratadine (CLARITIN) 10 MG tablet Take 10 mg by mouth daily.    . Multiple Vitamins-Minerals (MULTIVITAMIN PO) Take by mouth daily.    Marland Kitchen topiramate (TOPAMAX) 50 MG tablet Take 1 tablet (50 mg total) by mouth daily. 30 tablet 5  . valsartan (DIOVAN) 320 MG tablet Take 320 mg by mouth daily.     No facility-administered medications prior to visit.    PAST MEDICAL HISTORY: Past Medical History  Diagnosis Date  . Hypertension   . Pulmonary embolism 2008    secondary to OCP  . Abnormal Pap smear  7/13    ASCUS/positive HR HPV, negative colpo  . Anemia 2006  . Fibroids     with enlarged uterus  . 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 Myomectomy    . Oophorectomy      left  . Left finger reattachment  age 8    FAMILY HISTORY: Family History  Problem Relation Age of Onset  . Diabetes Mother   . Hypertension Mother   . Heart disease Mother     cardiomegaly  . Lupus Mother   . Diabetes [name]   . Hypertension [name]   . Stroke [name]   . Hypertension Maternal Grandmother   . Stroke Maternal Grandmother   . Hypertension Sister   . Seizures Sister   . COPD Sister     SOCIAL HISTORY: History   Social History  . Marital Status: Single    Spouse Name: N/A  . Number of Children: 0  . Years of Education: BA   Occupational History  . Warden/ranger    Social History Main Topics  . Smoking status: Never Smoker   . Smokeless tobacco: Never Used  . Alcohol Use: 0.0 oz/week    0 Standard drinks or equivalent per week     Comment: 1 a month  . Drug Use: No  . Sexual Activity:    Partners: Male    Birth Control/ Protection: None   Other Topics Concern  . Not on file   Social History Narrative   Patient is single with no children.   Patient is right handed.   Patient has a BA degree.   Patient drinks 1 cup daily.      PHYSICAL EXAM  Filed Vitals:   11/27/14 0759  BP: 125/80  Pulse: 80  Height: 5\' 4"  (1.626 m)  Weight: 195 lb (88.451 kg)   Body mass index is 33.46 kg/(m^2).  Generalized: Well developed, in no acute distress   Neurological examination  Mentation: Alert oriented to time, place, history taking. Follows all commands speech and language fluent Cranial nerve II-XII: Pupils were equal round reactive to light. Extraocular movements were full, visual field were full on confrontational test. Facial sensation and strength were normal. Uvula tongue midline. Head  turning and shoulder shrug  were normal and symmetric. Motor: The motor testing reveals 5 over 5 strength of all 4 extremities. Good symmetric motor tone is noted throughout.  Sensory: Sensory testing is intact to soft touch on all 4 extremities. No evidence of extinction is noted.  Coordination: Cerebellar testing reveals good finger-nose-finger and heel-to-shin bilaterally.  Gait and station: Gait is normal.  Reflexes: Deep tendon reflexes are symmetric and normal bilaterally.   DIAGNOSTIC DATA (LABS, IMAGING, TESTING) - I reviewed patient records, labs, notes, testing and imaging myself where available.      ASSESSMENT AND  PLAN 49 y.o. 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 here with   1. Disturbance of skin sensation 2. Dizziness 3. Headache  The patient's dizziness and pressure sensation in the head has resolved with Topamax. She will continue to take Topamax 50 mg at bedtime. The patient has noticed some burning and tingling sensations on different parts of the body that occurs once or twice a week. In the past high stress has exacerbated sensory changes on the skin. We will monitor this for now. If the patient finds that this is happening more frequently she will let us know. She will follow-up in 6 months or sooner if needed.   Ward Givens, MSN, NP-C 11/27/2014, 8:16 AM [facility] [address] [place], [place] [address] [phone]  Note: This document was prepared with digital dictation and possible smart phrase technology. Any transcriptional errors that result from this process are unintentional.

## 2014-12-04 ENCOUNTER — Other Ambulatory Visit: Payer: Self-pay | Admitting: *Deleted

## 2014-12-04 ENCOUNTER — Telehealth: Payer: Self-pay | Admitting: Obstetrics & Gynecology

## 2014-12-04 ENCOUNTER — Ambulatory Visit
Admission: RE | Admit: 2014-12-04 | Discharge: 2014-12-04 | Disposition: A | Discharge status: Home / Self Care | Payer: BLUE CROSS/BLUE SHIELD | Source: Ambulatory Visit | Attending: Obstetrics & Gynecology | Admitting: Obstetrics & Gynecology

## 2014-12-04 DIAGNOSIS — D259 Leiomyoma of uterus, unspecified: Secondary | ICD-10-CM | POA: Insufficient documentation

## 2014-12-04 NOTE — Telephone Encounter (Addendum)
Spoke with Jocelyn Lamer from [facility] who states that patient is to be seen with Dr.Hoss today to discuss possible [place]. Referral was placed by Dr.Miller. Per Jocelyn Lamer patient will need a MRI of the pelvis with and without contrast before Dr.Hoss can further recommendations about possible [place]. Order for MRI pelvis with and without contrast for evaluation of uterine fibroids placed. Jocelyn Lamer is agreeable. Jocelyn Lamer checked with [employer] and no PA is needed for this imaging.  Routing to provider for final review. Patient agreeable to disposition.

## 2014-12-04 NOTE — Consult Note (Addendum)
Chief Complaint: Patient was seen in consultation today for  Chief Complaint  Patient presents with  . Advice Only    Consult for [place]    at the request of Miller,Mary S  Referring Physician(s): Miller,Mary S  History of Present Illness: [name] is a 49 y.o. female with a history of fibroids for 15 years. She underwent partial hysterectomy in 2001 and 2008. Since then, she has had progressively worsening menorrhagia associated with anemia. She denies lethargy. Her last hemoglobin was reportedly 10.5, although it has been as low as 7. She is on iron replacement therapy. She does not have plans for gestation and pregnancy. She denies PID. She was on oral contraception lives but suffered a pulmonary thromboembolism, and since then has not been on any contraception therapy or medical therapy for menorrhagia.  Past Medical History  Diagnosis Date  . Hypertension   . Pulmonary embolism 2008    secondary to OCP  . Abnormal Pap smear 7/13    ASCUS/positive HR HPV, negative colpo  . Anemia 2006  . Fibroids     with enlarged uterus  . 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 Myomectomy    . Oophorectomy      left  . Left finger reattachment  age 61    Allergies: Minocycline hcl and Sulfa antibiotics  Medications: Prior to Admission medications   Medication Sig Start Date End Date Taking? Authorizing Provider  Cyanocobalamin (B-12 PO) Take by mouth daily.    Historical Provider, MD  DEXILANT 60 MG capsule  09/04/14   Historical Provider, MD  fexofenadine (ALLEGRA) 180 MG tablet Take 180 mg by mouth as needed.     Historical Provider, MD  hydrochlorothiazide 25 MG tablet Take 25 mg by mouth daily.      Historical Provider, MD  Iron-FA-B Cmp-C-Biot-Probiotic (FUSION PLUS) CAPS Take 1 capsule by mouth daily. 09/11/14   Regina Eck, CNM  loratadine (CLARITIN) 10 MG tablet Take 10 mg by mouth daily.    Historical Provider,  MD  Multiple Vitamins-Minerals (MULTIVITAMIN PO) Take by mouth daily.    Historical Provider, MD  topiramate (TOPAMAX) 50 MG tablet Take 1 tablet (50 mg total) by mouth daily. 07/31/14   Ward Givens, NP  valsartan (DIOVAN) 320 MG tablet Take 320 mg by mouth daily.    Historical Provider, MD     Family History  Problem Relation Age of Onset  . Diabetes Mother   . Hypertension Mother   . Heart disease Mother     cardiomegaly  . Lupus Mother   . Diabetes Father   . Hypertension Father   . Stroke Father   . Hypertension Maternal Grandmother   . Stroke Maternal Grandmother   . Hypertension Sister   . Seizures Sister   . COPD Sister     History   Social History  . Marital Status: Single    Spouse Name: N/A  . Number of Children: 0  . Years of Education: BA   Occupational History  . Warden/ranger    Social History Main Topics  . Smoking status: Never Smoker   . Smokeless tobacco: Never Used  . Alcohol Use: 0.0 oz/week    0 Standard drinks or equivalent per week     Comment: 1 a month  . Drug Use: No  . Sexual Activity:    Partners: Male    Patent examiner Protection: None  Other Topics Concern  . Not on file   Social History Narrative   Patient is single with no children.   Patient is right handed.   Patient has a BA degree.   Patient drinks 1 cup daily.      Review of Systems: A 12 point ROS discussed and pertinent positives are indicated in the HPI above.  All other systems are negative.  Review of Systems  Vital Signs: BP 109/79 mmHg  Pulse 81  Temp(Src) 98.5 F (36.9 C) (Oral)  Resp 15  Ht 5' 4.5" (1.638 m)  Wt 190 lb (86.183 kg)  BMI 32.12 kg/m2  SpO2 99%  LMP 11/29/2014 (Approximate)  Physical Exam  Constitutional: She is oriented to person, place, and time. She appears well-developed and well-nourished.  HENT:  Head: Normocephalic and atraumatic.  Cardiovascular: Normal rate and regular rhythm.   Pulmonary/Chest: Effort normal and  breath sounds normal.  Musculoskeletal:  Fem/PT/DP pulse 2+ bilateral  Neurological: She is alert and oriented to person, place, and time.  Skin: Skin is warm and dry.    Mallampati Score:     Imaging: Ultrasound performed this year demonstrates innumerable fibroids and a markedly enlarged  Labs:  CBC: No results for input(s): WBC, HGB, HCT, PLT in the last 8760 hours.  COAGS: No results for input(s): INR, APTT in the last 8760 hours.  BMP: No results for input(s): NA, K, CL, CO2, GLUCOSE, BUN, CALCIUM, CREATININE, GFRNONAA, GFRAA in the last 8760 hours.  Invalid input(s): CMP  LIVER FUNCTION TESTS: No results for input(s): BILITOT, AST, ALT, ALKPHOS, PROT, ALBUMIN in the last 8760 hours.  TUMOR MARKERS: No results for input(s): AFPTM, CEA, CA199, CHROMGRNA in the last 8760 hours.  Assessment and Plan:  Ms. [name] has symptomatically fibroids associated with menorrhagia based on ultrasound. She will need MRI and endometrial biopsy. Pending the results, we will decide if she is a candidate for uterine artery embolization. She understands and her questions were answered.  Thank you for this interesting consult.  I greatly enjoyed meeting [name] and look forward to participating in their care.  A copy of this report was sent to the requesting provider on this date.  Signed: Markell Schrier, ART A 12/04/2014, 2:58 PM   I spent a total of   25 Minutes in face to face in clinical consultation, greater than 50% of which was counseling/coordinating care for uterine fibroid embolization.  Because of her history of pulmonary thromboembolism, she will be placed on Lovenox perioperatively.

## 2014-12-04 NOTE — Telephone Encounter (Signed)
I entered order at same time as kaitlyn. Confirmed with Jocelyn Lamer that order is now entered twice. She will delete one. Encounter closed.

## 2014-12-04 NOTE — Telephone Encounter (Signed)
[name] from [facility] is calling requesting an order for: uterine fibroids MRI with and without contrast. The patient is in their office now.

## 2014-12-05 ENCOUNTER — Ambulatory Visit: Payer: Self-pay | Admitting: Certified Nurse Midwife

## 2014-12-08 ENCOUNTER — Encounter (HOSPITAL_BASED_OUTPATIENT_CLINIC_OR_DEPARTMENT_OTHER): Payer: Self-pay | Admitting: Emergency Medicine

## 2014-12-08 ENCOUNTER — Emergency Department (HOSPITAL_BASED_OUTPATIENT_CLINIC_OR_DEPARTMENT_OTHER)
Admission: EM | Admit: 2014-12-08 | Discharge: 2014-12-08 | Disposition: A | Discharge status: Home / Self Care | Payer: BLUE CROSS/BLUE SHIELD | Attending: Emergency Medicine | Admitting: Emergency Medicine

## 2014-12-08 DIAGNOSIS — Z79899 Other long term (current) drug therapy: Secondary | ICD-10-CM | POA: Diagnosis not present

## 2014-12-08 DIAGNOSIS — D649 Anemia, unspecified: Secondary | ICD-10-CM | POA: Diagnosis not present

## 2014-12-08 DIAGNOSIS — I10 Essential (primary) hypertension: Secondary | ICD-10-CM | POA: Diagnosis not present

## 2014-12-08 DIAGNOSIS — Z86711 Personal history of pulmonary embolism: Secondary | ICD-10-CM | POA: Insufficient documentation

## 2014-12-08 DIAGNOSIS — Z8619 Personal history of other infectious and parasitic diseases: Secondary | ICD-10-CM | POA: Insufficient documentation

## 2014-12-08 DIAGNOSIS — R531 Weakness: Secondary | ICD-10-CM

## 2014-12-08 DIAGNOSIS — M6281 Muscle weakness (generalized): Secondary | ICD-10-CM | POA: Insufficient documentation

## 2014-12-08 DIAGNOSIS — Z86018 Personal history of other benign neoplasm: Secondary | ICD-10-CM | POA: Diagnosis not present

## 2014-12-08 LAB — CBC WITH DIFFERENTIAL/PLATELET
BASOS ABS: 0 10*3/uL (ref 0.0–0.1)
Basophils Relative: 1 % (ref 0–1)
EOS PCT: 1 % (ref 0–5)
Eosinophils Absolute: 0 10*3/uL (ref 0.0–0.7)
HCT: 31.8 % — ABNORMAL LOW (ref 36.0–46.0)
Hemoglobin: 9.8 g/dL — ABNORMAL LOW (ref 12.0–15.0)
Lymphocytes Relative: 27 % (ref 12–46)
Lymphs Abs: 1.3 10*3/uL (ref 0.7–4.0)
MCH: 26.7 pg (ref 26.0–34.0)
MCHC: 30.8 g/dL (ref 30.0–36.0)
MCV: 86.6 fL (ref 78.0–100.0)
Monocytes Absolute: 0.4 10*3/uL (ref 0.1–1.0)
Monocytes Relative: 8 % (ref 3–12)
Neutro Abs: 3.1 10*3/uL (ref 1.7–7.7)
Neutrophils Relative %: 63 % (ref 43–77)
Platelets: 290 10*3/uL (ref 150–400)
RBC: 3.67 MIL/uL — ABNORMAL LOW (ref 3.87–5.11)
RDW: 13.8 % (ref 11.5–15.5)
WBC: 4.9 10*3/uL (ref 4.0–10.5)

## 2014-12-08 LAB — COMPREHENSIVE METABOLIC PANEL
ALT: 25 U/L (ref 14–54)
ANION GAP: 6 (ref 5–15)
AST: 22 U/L (ref 15–41)
Albumin: 3.8 g/dL (ref 3.5–5.0)
Alkaline Phosphatase: 63 U/L (ref 38–126)
BUN: 15 mg/dL (ref 6–20)
CO2: 28 mmol/L (ref 22–32)
CREATININE: 0.74 mg/dL (ref 0.44–1.00)
Calcium: 9.2 mg/dL (ref 8.9–10.3)
Chloride: 102 mmol/L (ref 101–111)
Glucose, Bld: 105 mg/dL — ABNORMAL HIGH (ref 65–99)
Potassium: 3.3 mmol/L — ABNORMAL LOW (ref 3.5–5.1)
Sodium: 136 mmol/L (ref 135–145)
TOTAL PROTEIN: 8.2 g/dL — AB (ref 6.5–8.1)
Total Bilirubin: 0.5 mg/dL (ref 0.3–1.2)

## 2014-12-08 LAB — TROPONIN I: Troponin I: <0.03 ng/mL (ref <0.031)

## 2014-12-08 NOTE — ED Notes (Signed)
MD with pt  

## 2014-12-08 NOTE — ED Notes (Addendum)
Pt reports generalized weakness onset after taking evening medications pt reports started new medication tonight for anxiety "pristiq"

## 2014-12-08 NOTE — ED Notes (Signed)
Pt states she has a hx of anemia and takes iron supplements.

## 2014-12-08 NOTE — ED Provider Notes (Signed)
CSN: [id number]     Arrival date & time 12/08/14  0046 History   First MD Initiated Contact with Patient 12/08/14 [phone]     Chief Complaint  Patient presents with  . Weakness    generalized     (Consider location/radiation/quality/duration/timing/severity/associated sxs/prior Treatment) HPI  this is a 49 year old female who was recently started on Pristiq. She took her third dose yesterday evening. About midnight she got up to go to the bathroom and felt "funny". She experienced lightheadedness along with a "warm, tingly" feeling in her back. The lightheadedness lasted about 3 minutes. The warm, tingly feeling lasted about a minute. The symptoms alarmed her and caused her to become concerned. She had no shortness of breath, chest pain, nausea, vomiting or diarrhea. She denies any diaphoresis. Her symptoms have resolved and she is asymptomatic at present time.  Past Medical History  Diagnosis Date  . Hypertension   . Pulmonary embolism 2008    secondary to OCP  . Abnormal Pap smear 7/13    ASCUS/positive HR HPV, negative colpo  . Anemia 2006  . Fibroids     with enlarged uterus  . 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 Myomectomy    . Oophorectomy      left  . Left finger reattachment  age 48   Family History  Problem Relation Age of Onset  . Diabetes Mother   . Hypertension Mother   . Heart disease Mother     cardiomegaly  . Lupus Mother   . Diabetes Father   . Hypertension Father   . Stroke Father   . Hypertension Maternal Grandmother   . Stroke Maternal Grandmother   . Hypertension Sister   . Seizures Sister   . COPD Sister    History  Substance Use Topics  . Smoking status: Never Smoker   . Smokeless tobacco: Never Used  . Alcohol Use: 0.0 oz/week    0 Standard drinks or equivalent per week     Comment: 1 a month   OB History    Gravida Para Term Preterm AB TAB SAB Ectopic Multiple Living   3 0   3     0      Review of Systems  All other systems reviewed and are negative.   Allergies  Minocycline hcl and Sulfa antibiotics  Home Medications   Prior to Admission medications   Medication Sig Start Date End Date Taking? Authorizing Provider  Cyanocobalamin (B-12 PO) Take by mouth daily.    Historical Provider, MD  DEXILANT 60 MG capsule  09/04/14   Historical Provider, MD  fexofenadine (ALLEGRA) 180 MG tablet Take 180 mg by mouth as needed.     Historical Provider, MD  hydrochlorothiazide 25 MG tablet Take 25 mg by mouth daily.      Historical Provider, MD  Iron-FA-B Cmp-C-Biot-Probiotic (FUSION PLUS) CAPS Take 1 capsule by mouth daily. 09/11/14   Regina Eck, CNM  loratadine (CLARITIN) 10 MG tablet Take 10 mg by mouth daily.    Historical Provider, MD  Multiple Vitamins-Minerals (MULTIVITAMIN PO) Take by mouth daily.    Historical Provider, MD  topiramate (TOPAMAX) 50 MG tablet Take 1 tablet (50 mg total) by mouth daily. 07/31/14   Ward Givens, NP  valsartan (DIOVAN) 320 MG tablet Take 320 mg by mouth daily.    Historical Provider, MD   BP 149/85 mmHg  Pulse 65  Temp(Src) 98.4 F (36.9 C) (Oral)  Resp 20  Ht 5' 4.5" (1.638 m)  Wt 189 lb (85.73 kg)  BMI 31.95 kg/m2  SpO2 98%  LMP 11/29/2014 (Approximate)   Physical Exam  General: Well-developed, well-nourished female in no acute distress; appearance consistent with age of record HENT: normocephalic; atraumatic Eyes: pupils equal, round and reactive to light; extraocular muscles intact Neck: supple Heart: regular rate and rhythm; no murmur Lungs: clear to auscultation bilaterally Abdomen: soft; nondistended; nontender; no masses or hepatosplenomegaly; bowel sounds present Extremities: No deformity; full range of motion; pulses normal Neurologic: Awake, alert and oriented; motor function intact in all extremities and symmetric; no facial droop Skin: Warm and dry Psychiatric: Normal mood and affect    ED Course   Procedures (including critical care time)   MDM   Nursing notes and vitals signs, including pulse oximetry, reviewed.  Summary of this visit's results, reviewed by myself:  EKG Interpretation:  Date & Time: 12/08/2014 3:04 AM  Rate: 55  Rhythm: sinus bradycardia  QRS Axis: normal  Intervals: normal  ST/T Wave abnormalities: Anterior T-wave inversions  Conduction Disutrbances:none  Narrative Interpretation:   Old EKG Reviewed: No significant changes  Labs:  Results for orders placed or performed during the hospital encounter of 12/08/14 (from the past 24 hour(s))  CBC with Differential     Status: Abnormal   Collection Time: 12/08/14  3:10 AM  Result Value Ref Range   WBC 4.9 4.0 - 10.5 K/uL   RBC 3.67 (L) 3.87 - 5.11 MIL/uL   Hemoglobin 9.8 (L) 12.0 - 15.0 g/dL   HCT 31.8 (L) 36.0 - 46.0 %   MCV 86.6 78.0 - 100.0 fL   MCH 26.7 26.0 - 34.0 pg   MCHC 30.8 30.0 - 36.0 g/dL   RDW 13.8 11.5 - 15.5 %   Platelets 290 150 - 400 K/uL   Neutrophils Relative % 63 43 - 77 %   Neutro Abs 3.1 1.7 - 7.7 K/uL   Lymphocytes Relative 27 12 - 46 %   Lymphs Abs 1.3 0.7 - 4.0 K/uL   Monocytes Relative 8 3 - 12 %   Monocytes Absolute 0.4 0.1 - 1.0 K/uL   Eosinophils Relative 1 0 - 5 %   Eosinophils Absolute 0.0 0.0 - 0.7 K/uL   Basophils Relative 1 0 - 1 %   Basophils Absolute 0.0 0.0 - 0.1 K/uL  Comprehensive metabolic panel     Status: Abnormal   Collection Time: 12/08/14  3:10 AM  Result Value Ref Range   Sodium 136 135 - 145 mmol/L   Potassium 3.3 (L) 3.5 - 5.1 mmol/L   Chloride 102 101 - 111 mmol/L   CO2 28 22 - 32 mmol/L   Glucose, Bld 105 (H) 65 - 99 mg/dL   BUN 15 6 - 20 mg/dL   Creatinine, Ser 0.74 0.44 - 1.00 mg/dL   Calcium 9.2 8.9 - 10.3 mg/dL   Total Protein 8.2 (H) 6.5 - 8.1 g/dL   Albumin 3.8 3.5 - 5.0 g/dL   AST 22 15 - 41 U/L   ALT 25 14 - 54 U/L   Alkaline Phosphatase 63 38 - 126 U/L   Total Bilirubin 0.5 0.3 - 1.2 mg/dL   GFR calc non Af Amer >60 >60 mL/min    GFR calc Af Amer >60 >60 mL/min   Anion gap 6 5 - 15  Troponin I     Status: None   Collection Time: 12/08/14  3:10 AM  Result Value Ref Range  Troponin I <0.03 <0.031 ng/mL   The patient has a history of anemia. Her symptoms may be related to her new medication. She was advised to hold today's dose until she can talk to her physician tomorrow.    Shanon Rosser, MD 12/08/14 [phone]

## 2015-01-29 ENCOUNTER — Other Ambulatory Visit: Payer: Self-pay | Admitting: Certified Nurse Midwife

## 2015-01-29 NOTE — Telephone Encounter (Signed)
Medication refill request: Fusion plus  Last AEX:  10/28/14 DL Next AEX: 01/23/16 SM  Last MMG (if hormonal medication request): 07/08/14 [id number] Refill authorized: 4/17/6 #[id number]. Today please advise.

## 2015-03-18 ENCOUNTER — Ambulatory Visit (INDEPENDENT_AMBULATORY_CARE_PROVIDER_SITE_OTHER): Payer: BLUE CROSS/BLUE SHIELD | Admitting: Certified Nurse Midwife

## 2015-03-18 ENCOUNTER — Encounter: Payer: Self-pay | Admitting: Certified Nurse Midwife

## 2015-03-18 VITALS — BP 118/78 | HR 72 | Temp 97.6°F | Resp 20 | Ht 64.5 in | Wt 196.0 lb

## 2015-03-18 DIAGNOSIS — R300 Dysuria: Secondary | ICD-10-CM

## 2015-03-18 DIAGNOSIS — D259 Leiomyoma of uterus, unspecified: Secondary | ICD-10-CM

## 2015-03-18 DIAGNOSIS — Z862 Personal history of diseases of the blood and blood-forming organs and certain disorders involving the immune mechanism: Secondary | ICD-10-CM

## 2015-03-18 LAB — POCT URINALYSIS DIPSTICK
Bilirubin, UA: NEGATIVE
Blood, UA: NEGATIVE
Glucose, UA: NEGATIVE
Ketones, UA: NEGATIVE
LEUKOCYTES UA: NEGATIVE
NITRITE UA: NEGATIVE
PH UA: 5
Protein, UA: NEGATIVE
Urobilinogen, UA: NEGATIVE

## 2015-03-18 LAB — CBC
HCT: 31 % — ABNORMAL LOW (ref 36.0–46.0)
HEMOGLOBIN: 9.5 g/dL — AB (ref 12.0–15.0)
MCH: 24.7 pg — ABNORMAL LOW (ref 26.0–34.0)
MCHC: 30.6 g/dL (ref 30.0–36.0)
MCV: 80.5 fL (ref 78.0–100.0)
MPV: 10.6 fL (ref 8.6–12.4)
PLATELETS: 258 10*3/uL (ref 150–400)
RBC: 3.85 MIL/uL — ABNORMAL LOW (ref 3.87–5.11)
RDW: 15.7 % — ABNORMAL HIGH (ref 11.5–15.5)
WBC: 5.5 10*3/uL (ref 4.0–10.5)

## 2015-03-18 MED ORDER — PHENAZOPYRIDINE HCL 200 MG PO TABS: 200.0000 mg | ORAL_TABLET | Freq: Three times a day (TID) | ORAL | Status: DC | PRN

## 2015-03-18 NOTE — Patient Instructions (Signed)

## 2015-03-18 NOTE — Progress Notes (Signed)
49 y.o.Married african [place] female [id number] here with complaint of UTI, with onset  on 2 days ago.. Patient complaining of pain with urination. Patient denies fever, chills, nausea or back pain. No new personal products. Patient feels related to sexual activity. Denies any vaginal symptoms. Patient is sexually active more now. Recently married in 9/16.   Contraception is none due to history fibroids and infertility. Patient denies vaginal dryness or vaginal odor. She also would like to have her iron level checked due to increase in eating ice again. History of anemia due to menorrhagia with cycles. Considering [place] for fibroids. Will call when she is ready for referral. No other health concerns today.   POCT: urine negative O: Healthy female WDWN Affect: Normal, orientation x 3 Skin : warm and dry CVAT: negative bilateral Abdomen: negative for suprapubic tenderness  Pelvic exam: External genital area: normal, no lesions Bladder,Urethra non tender, Urethral meatus: non tender,  Not red Vagina:white thickl vaginal discharge, normal appearance  Affirm taken Cervix: normal, non tender Uterus:enlarged 16-18 week size , nodular feel history on multiple fibroids, recent PUS and and abdominal US for assessment. Adnexa: not palpable, unable to feel due to enlarged uterus.  A: R/O UTI vs vaginal infection Known uterine fibroids with uterine enlargement History of anemia with menorrhagia, symptomatic again  P: Discussed frequent sexual activity may cause burning, with no infection present. Physical exam does not indicate UTI, but will do lab.Discussed Rx for symptoms to see if resolves. Encouraged to increase water and empty bladder before and after sexual activity. BT:DHRCBULA see order GTX:MIWOE micro, Affirm Reviewed warning signs and symptoms of UTI and need to advise if occurring. Encouraged to limit soda, tea, and coffee Stressed importance of good diet with good iron and folic acid intake to  help with anemia. Lab: CBC, Iron, IBC Patient will advise if symptoms change with fibroids and when she is ready for referral for [place].     RV prn

## 2015-03-19 ENCOUNTER — Other Ambulatory Visit: Payer: Self-pay | Admitting: Certified Nurse Midwife

## 2015-03-19 DIAGNOSIS — B9689 Other specified bacterial agents as the cause of diseases classified elsewhere: Secondary | ICD-10-CM

## 2015-03-19 DIAGNOSIS — D509 Iron deficiency anemia, unspecified: Secondary | ICD-10-CM

## 2015-03-19 DIAGNOSIS — N760 Acute vaginitis: Secondary | ICD-10-CM

## 2015-03-19 LAB — WET PREP BY MOLECULAR PROBE
CANDIDA SPECIES: NEGATIVE
Gardnerella vaginalis: POSITIVE — AB
Trichomonas vaginosis: NEGATIVE

## 2015-03-19 LAB — IBC PANEL
%SAT: 8 % — AB (ref 11–50)
TIBC: 318 ug/dL (ref 250–450)
UIBC: 293 ug/dL (ref 125–400)

## 2015-03-19 LAB — URINALYSIS, MICROSCOPIC ONLY
CASTS: NONE SEEN [LPF]
CRYSTALS: NONE SEEN [HPF]
Yeast: NONE SEEN [HPF]

## 2015-03-19 LAB — IRON: IRON: 25 ug/dL — AB (ref 40–190)

## 2015-03-19 MED ORDER — FUSION PLUS PO CAPS: 1.0000 | ORAL_CAPSULE | Freq: Two times a day (BID) | ORAL | Status: DC

## 2015-03-19 MED ORDER — HYLAFEM VA SUPP
1.0000 | Freq: Every day | VAGINAL | Status: DC
Start: 2015-03-19 — End: 2015-03-20

## 2015-03-19 NOTE — Progress Notes (Signed)
Encounter reviewed Jill Jertson, MD   

## 2015-03-20 ENCOUNTER — Telehealth: Payer: Self-pay | Admitting: Certified Nurse Midwife

## 2015-03-20 ENCOUNTER — Other Ambulatory Visit: Payer: Self-pay | Admitting: Certified Nurse Midwife

## 2015-03-20 DIAGNOSIS — B9689 Other specified bacterial agents as the cause of diseases classified elsewhere: Secondary | ICD-10-CM

## 2015-03-20 DIAGNOSIS — N760 Acute vaginitis: Principal | ICD-10-CM

## 2015-03-20 MED ORDER — METRONIDAZOLE 0.75 % VA GEL: 1.0000 | Freq: Two times a day (BID) | VAGINAL | Status: DC

## 2015-03-20 NOTE — Telephone Encounter (Signed)
Can do Metrogel, will send to pharmacy

## 2015-03-20 NOTE — Telephone Encounter (Signed)
Spoke with patient. Advised of message as seen below from [place]. Patient is agreeable and verbalizes understanding.  Routing to provider for final review. Patient agreeable to disposition. Will close encounter.

## 2015-03-20 NOTE — Telephone Encounter (Signed)
She should not need this now, due to problem being the BV causing the burning around urethral meatus and micro urine negative. She should have been called regarding this problem, result note sent. Affirm noted BV with Hylafem order sent

## 2015-03-20 NOTE — Telephone Encounter (Signed)
Patient's is requesting a different medication for phenazopyridine (PYRIDIUM) because it is not covered with her insurance.

## 2015-03-20 NOTE — Telephone Encounter (Signed)
Spoke with patient. Patient states that she had the medication mixed up. It is the Hylafem that is not covered with her insurance. Patient states that she can not afford to have this filed and would like an alternative.

## 2015-03-20 NOTE — Telephone Encounter (Signed)
Sending to DL for review -eh

## 2015-04-29 ENCOUNTER — Other Ambulatory Visit: Payer: BLUE CROSS/BLUE SHIELD

## 2015-04-30 ENCOUNTER — Other Ambulatory Visit (INDEPENDENT_AMBULATORY_CARE_PROVIDER_SITE_OTHER): Payer: BLUE CROSS/BLUE SHIELD

## 2015-04-30 DIAGNOSIS — D509 Iron deficiency anemia, unspecified: Secondary | ICD-10-CM

## 2015-05-01 ENCOUNTER — Telehealth: Payer: Self-pay

## 2015-05-01 ENCOUNTER — Other Ambulatory Visit: Payer: Self-pay | Admitting: Certified Nurse Midwife

## 2015-05-01 DIAGNOSIS — D509 Iron deficiency anemia, unspecified: Secondary | ICD-10-CM

## 2015-05-01 LAB — IBC PANEL
%SAT: 10 % — ABNORMAL LOW (ref 11–50)
TIBC: 308 ug/dL (ref 250–450)
UIBC: 278 ug/dL (ref 125–400)

## 2015-05-01 LAB — CBC
HCT: 31.3 % — ABNORMAL LOW (ref 36.0–46.0)
Hemoglobin: 9.4 g/dL — ABNORMAL LOW (ref 12.0–15.0)
MCH: 24.9 pg — ABNORMAL LOW (ref 26.0–34.0)
MCHC: 30 g/dL (ref 30.0–36.0)
MCV: 82.8 fL (ref 78.0–100.0)
MPV: 9.9 fL (ref 8.6–12.4)
Platelets: 365 10*3/uL (ref 150–400)
RBC: 3.78 MIL/uL — AB (ref 3.87–5.11)
RDW: 17 % — ABNORMAL HIGH (ref 11.5–15.5)
WBC: 5.9 10*3/uL (ref 4.0–10.5)

## 2015-05-01 LAB — IRON: IRON: 30 ug/dL — AB (ref 40–190)

## 2015-05-01 LAB — FERRITIN: FERRITIN: 10 ng/mL (ref 10–291)

## 2015-05-01 NOTE — Telephone Encounter (Signed)
Patient returning your call.

## 2015-05-01 NOTE — Telephone Encounter (Signed)
[email or web address]

## 2015-05-01 NOTE — Telephone Encounter (Signed)
-----   Message from Regina Eck, CNM sent at 05/01/2015  7:53 AM EST ----- Notify patient that iron level has improved form 25 to 30 Saturation level has improved slightly form 8 to 10 Ferritin is just borderline normal at 10 CBC is still showing anemia profile with RBC at 3.78 from 3.85, Hgb. 9.4 form 9.5 Needs to take fusion plus bid one in am and one before bed. Repeat labs in 6 weeks order in, please schedule

## 2015-05-02 NOTE — Telephone Encounter (Signed)
Patient notified of results. See lab 

## 2015-05-05 ENCOUNTER — Other Ambulatory Visit: Payer: Self-pay | Admitting: Certified Nurse Midwife

## 2015-05-06 ENCOUNTER — Other Ambulatory Visit: Payer: Self-pay | Admitting: Certified Nurse Midwife

## 2015-05-06 NOTE — Telephone Encounter (Signed)
I called patient and let her know that her Iron was sent into [facility]. -eh

## 2015-05-06 NOTE — Telephone Encounter (Signed)
Medication refill request: Fusion plus Last AEX:  10/28/14 DL Next AEX: 01/23/16 SM Last MMG (if hormonal medication request): 07/05/14 [id number]:neg Refill authorized: 03/19/15 #90caps/3R to [facility].  Request coming from [place]. New Rx [facility] as prescribed before.

## 2015-05-06 NOTE — Telephone Encounter (Signed)
Patient calling requesting a refill on her Iron Fusion Plus in three month increments. Pharmacy on file is correct.

## 2015-05-08 ENCOUNTER — Other Ambulatory Visit: Payer: Self-pay | Admitting: Certified Nurse Midwife

## 2015-05-08 DIAGNOSIS — D509 Iron deficiency anemia, unspecified: Secondary | ICD-10-CM

## 2015-05-23 ENCOUNTER — Telehealth: Payer: Self-pay | Admitting: Certified Nurse Midwife

## 2015-05-23 NOTE — Telephone Encounter (Signed)
Spoke with patient. Advised of message as seen below from Deborah Leonard CNM. Patient is agreeable and verbalizes understanding.  Routing to provider for final review. Patient agreeable to disposition. Will close encounter.  

## 2015-05-23 NOTE — Telephone Encounter (Signed)
Spoke with patient. Patient states that she will be getting married in February and is due to have her cycle the week of her honeymoon. Patient is not currently on any form of birth control. Patient started her cycle January 4th. States she typically has a 28 day cycle. Is leaving for her trip on February 27th. Patient is requesting to start on birth control or for other recommendations on how to skip her cycle during this time. Advised I will speak with Melvia Heaps CNM and return call with further information. Patient is agreeable.

## 2015-05-23 NOTE — Telephone Encounter (Signed)
There is not a definite way to avoid cycle. Would not be long enough with OCP use to avoid a cycle. She has not been using contraception and has fibroids.

## 2015-05-23 NOTE — Telephone Encounter (Signed)
Patient would like to speak with nurse in regards to some advise no info given, would prefer to speak with nurse. Per patient she is not having a problem or anything just needs advice. Best # to reach: [phone]

## 2015-06-02 ENCOUNTER — Ambulatory Visit: Payer: BLUE CROSS/BLUE SHIELD | Admitting: Adult Health

## 2015-06-03 ENCOUNTER — Encounter: Payer: Self-pay | Admitting: Adult Health

## 2015-08-04 ENCOUNTER — Telehealth: Payer: Self-pay | Admitting: Certified Nurse Midwife

## 2015-08-04 NOTE — Telephone Encounter (Signed)
Patient was seen in office on 11/05/2014 for PUS with Dr.Miller. Please see results and recommendations below. Spoke with patient. Patient states that last Tuesday 07/29/2015 she began to have right sided pain that is midline. Reports this is the same area she has had pain before due to her fibroids. Reports the area is tender to the touch and when she bends over. Pain is alleviated with laying or sitting. Denies any nausea, vomiting, change in BM, fever, or chills. Reports over the weekend pain was moderate, but today pain is mild and intermittent. Denies current pain. Patient would like to be seen in the office for further evaluation. Appointment scheduled for tomorrow 08/05/2015 at 2:30 pm with Dr.Miller. She is agreeable to date and time. Advised if pain worsens or develops new symptoms she will need to be seen in office sooner or with local ER. She is agreeable.  FINDINGS: UTERUS: significantly enlarged to 16.5 x 10.5 x 13.5cm with multiple fibroids, largest 6.0cm. Other fibroids, 4.8cm, 3.0cm, 3.0cm, 5.9cm, 2.2cm, 4.0cm, 2.4cm, 2.0cm, 4.0cm EMS: 5.48mm ADNEXA:  Left ovary surgically absent Right ovary 4.3 x 3.0 x 2.9cm with 3.2cm simple cyst CUL DE SAC: no free fluid noted  Reviewed findings with pt. She is not interested in surgery at this time. H/O PE while on OCPs. Aware would need Lovenox post operatively. Reviewed [place] with pt. This is something she might consider. Would like symptoms to improve but doesn't want anything with long recovery. Information provided regarding [place]. Referral will be made for pt to interventional radiology. Pt aware often 6-12 months before full benefits from procedure are reached. Also risks of DUB, pain with procedure reviewed.  Routing to provider for final review. Patient agreeable to disposition. Will close encounter.

## 2015-08-04 NOTE — Telephone Encounter (Signed)
Patient says she has been having abdominal pain where her fibroids are located.

## 2015-08-05 ENCOUNTER — Encounter: Payer: Self-pay | Admitting: Obstetrics & Gynecology

## 2015-08-05 ENCOUNTER — Ambulatory Visit (INDEPENDENT_AMBULATORY_CARE_PROVIDER_SITE_OTHER): Payer: BLUE CROSS/BLUE SHIELD | Admitting: Obstetrics & Gynecology

## 2015-08-05 ENCOUNTER — Telehealth: Payer: Self-pay

## 2015-08-05 VITALS — BP 128/80 | HR 86 | Resp 18 | Ht 64.5 in | Wt 197.0 lb

## 2015-08-05 DIAGNOSIS — N920 Excessive and frequent menstruation with regular cycle: Secondary | ICD-10-CM | POA: Diagnosis not present

## 2015-08-05 DIAGNOSIS — D251 Intramural leiomyoma of uterus: Secondary | ICD-10-CM

## 2015-08-05 DIAGNOSIS — D509 Iron deficiency anemia, unspecified: Secondary | ICD-10-CM

## 2015-08-05 DIAGNOSIS — Z0000 Encounter for general adult medical examination without abnormal findings: Secondary | ICD-10-CM | POA: Diagnosis not present

## 2015-08-05 DIAGNOSIS — N939 Abnormal uterine and vaginal bleeding, unspecified: Secondary | ICD-10-CM

## 2015-08-05 LAB — POCT URINALYSIS DIPSTICK
Bilirubin, UA: NEGATIVE
Blood, UA: NEGATIVE
Glucose, UA: NEGATIVE
Ketones, UA: NEGATIVE
LEUKOCYTES UA: NEGATIVE
NITRITE UA: NEGATIVE
PH UA: 5
Protein, UA: NEGATIVE
UROBILINOGEN UA: NEGATIVE

## 2015-08-05 NOTE — Telephone Encounter (Signed)
Call to [facility] with [facility] as patient would like to proceed with MRI and [place]. Patient was seen with Dr.Hoss on 12/04/2014 to discuss [place]. Vickie states that the patient will not need another consultation, but will need an MRI and a EMB. Requesting MRI be precerted with our office. Vickie will contact the patient directly to schedule the MRI at [place]. Order was previously placed. Will have [employer]. Advised I will notify the patient of the need for EMB with Dr.Miller and will schedule this with our office. Jocelyn Lamer is agreeable.

## 2015-08-05 NOTE — Progress Notes (Deleted)
Subjective:     Patient ID: [name], female   DOB: 10-06-1965, 50 y.o.   MRN: [id number]  HPI   Review of Systems     Objective:   Physical Exam     Assessment:     ***    Plan:     ***

## 2015-08-06 NOTE — Telephone Encounter (Signed)
Spoke with patient. Patient spoke with Vickie from [place] and is schedule for her MRI on 08/14/2015 at 8:30 am. Advised she will need further evaluation with EMB in office with Dr.Miller per Dr.Hoss's recommendation prior to schedule [place]. Patient is agreeable. EMB explained to patient. Patient is due to start her cycle on 08/09/2015. Appointment for EMB scheduled for 08/19/2015 at 3:30 pm with Dr.Miller. Advised if cycle does not start as expected will need to contact the office as this needs to be performed after her cycle, but before cycle day 12. She is agreeable and verbalizes understanding. Order has been placed for EMB. MRI and EMB will need precert.  Cc: Lerry Liner for precert  Routing to provider for final review. Patient agreeable to disposition. Will close encounter.

## 2015-08-07 NOTE — Progress Notes (Addendum)
Patient ID: [name], female   DOB: 09-15-1965, 50 y.o.   MRN: [id number]  GYNECOLOGY  VISIT   HPI: 50 y.o. [id number] Married [place] American female with known fibroids and h/o pelvic pain here for recent onset of RLQ pain.  Pt reports on 3/14, she started having sharp right sided pain.  This was mid abdomen and lateral to her umbilicus.  She's had pain in the past in the same region due to her fibroids.  She felt difficulty with bending over and felt there was tenderness to the touch.  Pt reports tenderness and pain have gradually improved and she doesn't feel it today.  Pt's last cycle was 07/13/15 and was "rediculous" per her explanation.  She reports her flow was very heavy with clots for two to three days and then improved.  She is not bleeding today but reports he cycle will start in another week and she's dreading it.  Denies any urinary symptoms except for urinary frequency which she's had a long time and has assumed is related to the size of her fibroids.  She denies fever, nausea/vomiting/constipation or diarrhea.  She has no recent back pain.  Pt was seen in June of last year.  PUS was performed then showing uterus measuring 16 x 10 x 13 cm with multiple fibroids.  Her right ovary was normal.  Left ovary is surgically absent.  Treatment options were reviewed.  Pt was interested in [place] as she's had hx of PE on OCPs.  She does not want to be back on blood thinners if possible.  She saw one of the interventionalists in consult.  I CANNOT find the note today despite looking in EPIC for several minutes with the pt.  MRI was pre-certed.  She never went ahead with this.  Pt has renewed interest in treatment due to her bleeding.  Last pap:  6/16, normal.    Of note, she got married in the early fall and shared some pictures with me.  She was a beautiful bride.  GYNECOLOGIC HISTORY: Patient's last menstrual period was 07/13/2015. Contraception: none  Patient Active Problem List   Diagnosis  Date Noted  . Fibroid, uterine   . Fibroids 10/28/2014    Class: History of  . Female infertility 03/19/2014  . Numbness of face 01/04/2014  . Other malaise and fatigue 01/04/2014  . Disturbance of skin sensation 01/04/2014  . Dizziness and giddiness 08/28/2013  . Condyloma 05/31/2013  . Anemia, iron deficiency 12/25/2012  . HYPERTENSION 01/30/2009  . CYSTIC ACNE 01/30/2009  . HYPERSOMNIA 01/30/2009  . PALPITATIONS, HX OF 01/30/2009    Past Medical History  Diagnosis Date  . Hypertension   . Pulmonary embolism ([place]) 2008    secondary to OCP  . Abnormal Pap smear 7/13    ASCUS/positive HR HPV, negative colpo  . Anemia 2006  . Fibroids     with enlarged uterus  . 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 Myomectomy  2003  . Left oophorectomy Left 1998       . Left finger reattachment  age 72  . Myomectomy  2008    via laparoscopy    MEDS:  Reviewed in EPIC and UTD  ALLERGIES: Minocycline hcl and Sulfa antibiotics  Family History  Problem Relation Age of Onset  . Diabetes Mother   . Hypertension Mother   . Heart disease Mother     cardiomegaly  . Lupus Mother   .  Diabetes Father   . Hypertension Father   . Stroke Father   . Hypertension Maternal Grandmother   . Stroke Maternal Grandmother   . Hypertension Sister   . Seizures Sister   . COPD Sister     SH:  Married, non-smoker  Review of Systems  Constitutional: Negative.   Gastrointestinal: Positive for abdominal pain. Negative for nausea, vomiting, diarrhea, constipation and blood in stool.  Genitourinary: Negative.     PHYSICAL EXAMINATION:    BP 128/80 mmHg  Pulse 86  Resp 18  Ht 5' 4.5" (1.638 m)  Wt 197 lb (89.359 kg)  BMI 33.31 kg/m2  LMP 07/13/2015    General appearance: alert, cooperative and appears stated age CV:  Regular rate and rhythm Lungs:  clear to auscultation, no wheezes, rales or rhonchi, symmetric air entry Abdomen: soft,  non-tender; bowel sounds normal; no organomegaly, grossly enlarged uterus up to the umbilicus  Pelvic: External genitalia:  no lesions              Urethra:  normal appearing urethra with no masses, tenderness or lesions              Bartholins and Skenes: normal                 Vagina: normal appearing vagina with normal color and discharge, no lesions              Cervix: no lesions              Bimanual Exam:  Uterus:  Enlarged with larger portion of uterus off to pt's right.  Top right of uterus has mild tenderness to palpation.  This is exactly where pt has felt pain.               Adnexa: no mass, fullness, tenderness          Chaperone was present for exam.  Assessment: Grossly enlarged uterus with symptomatic fibroids H/O anemia Right sided pain that may be due to degenerating fibroid(s)  Plan: Will try to get copy of note from IR to ensure no additional evaluation is needed Pt will be scheduled for MRI and IR office contacted for proceeding with uterine artery embolization   ~25 minutes spent with patient >50% of time was in face to face discussion of above.   Note from Dr. Maryclare Bean 09/05/15: Yes the lymph node is mildly enlarged. We recommend 3 to 6 month initial follow up for adenopathy if there is no prior imaging, and continued follow up until the 2 year interval (3 mo, 6 mo, one year, two yr), by CT. MR should be an adequate alternative with less radiation. If there is any growth, biopsy would be indicated.

## 2015-08-08 ENCOUNTER — Telehealth: Payer: Self-pay | Admitting: Obstetrics & Gynecology

## 2015-08-08 NOTE — Telephone Encounter (Signed)
Called patient to review benefits for procedure. Left voicemail to call back and review. °

## 2015-08-08 NOTE — Telephone Encounter (Signed)
Spoke with patient. Reviewed benefit for endometrial biopsy. Patient understood and agreeable. Patient aware and agreeable to arrival date/time. Patient aware of 72 hour cancellation policy with [id number] fee. No further questions. Ok to close.

## 2015-08-14 ENCOUNTER — Ambulatory Visit
Admission: RE | Admit: 2015-08-14 | Discharge: 2015-08-14 | Disposition: A | Discharge status: Home / Self Care | Payer: BLUE CROSS/BLUE SHIELD | Source: Ambulatory Visit | Attending: Obstetrics & Gynecology | Admitting: Obstetrics & Gynecology

## 2015-08-14 DIAGNOSIS — D259 Leiomyoma of uterus, unspecified: Secondary | ICD-10-CM

## 2015-08-14 MED ORDER — GADOBENATE DIMEGLUMINE 529 MG/ML IV SOLN
18.0000 mL | Freq: Once | INTRAVENOUS | Status: AC | PRN
  Administered 2015-08-14: 18 mL via INTRAVENOUS

## 2015-08-19 ENCOUNTER — Encounter: Payer: Self-pay | Admitting: Obstetrics & Gynecology

## 2015-08-19 ENCOUNTER — Ambulatory Visit (INDEPENDENT_AMBULATORY_CARE_PROVIDER_SITE_OTHER): Payer: BLUE CROSS/BLUE SHIELD | Admitting: Obstetrics & Gynecology

## 2015-08-19 VITALS — BP 140/80 | HR 70 | Resp 18 | Ht 64.5 in | Wt 201.0 lb

## 2015-08-19 DIAGNOSIS — N939 Abnormal uterine and vaginal bleeding, unspecified: Secondary | ICD-10-CM | POA: Diagnosis not present

## 2015-08-19 DIAGNOSIS — D251 Intramural leiomyoma of uterus: Secondary | ICD-10-CM | POA: Diagnosis not present

## 2015-08-19 DIAGNOSIS — D509 Iron deficiency anemia, unspecified: Secondary | ICD-10-CM | POA: Diagnosis not present

## 2015-08-19 DIAGNOSIS — N920 Excessive and frequent menstruation with regular cycle: Secondary | ICD-10-CM | POA: Diagnosis not present

## 2015-08-19 LAB — CBC WITH DIFFERENTIAL/PLATELET
BASOS ABS: 67 {cells}/uL (ref 0–200)
Basophils Relative: 1 %
EOS ABS: 201 {cells}/uL (ref 15–500)
EOS PCT: 3 %
HEMATOCRIT: 31 % — AB (ref 35.0–45.0)
HEMOGLOBIN: 9.8 g/dL — AB (ref 11.7–15.5)
LYMPHS ABS: 2144 {cells}/uL (ref 850–3900)
Lymphocytes Relative: 32 %
MCH: 26.6 pg — ABNORMAL LOW (ref 27.0–33.0)
MCHC: 31.6 g/dL — AB (ref 32.0–36.0)
MCV: 84 fL (ref 80.0–100.0)
MPV: 9.8 fL (ref 7.5–12.5)
Monocytes Absolute: 402 cells/uL (ref 200–950)
Monocytes Relative: 6 %
NEUTROS ABS: 3886 {cells}/uL (ref 1500–7800)
NEUTROS PCT: 58 %
Platelets: 371 10*3/uL (ref 140–400)
RBC: 3.69 MIL/uL — ABNORMAL LOW (ref 3.80–5.10)
RDW: 14.6 % (ref 11.0–15.0)
WBC: 6.7 10*3/uL (ref 3.8–10.8)

## 2015-08-25 NOTE — Progress Notes (Signed)
GYNECOLOGY  VISIT   HPI: 50 y.o. [id number] Married [place] American female here for endometrial biopsy before [place] and to also review her MRI results.  MRI was completed on 08/14/15.   Results include multiple fibroids measuring 7.4cm, 6.4cm, and several smaller ones.  Overall uterus measures 16.2 x 9.0 x 14.4cm with ovlume of 1050cc.  She has a 1.3 cm right external iliac node present.  Reviewed with pt this finding.  Will likely need follow-up but if pt proceeds with [place], then she will have an MRI in six months.  Will do CBC with differential today.  Lastly, hemorrhagic cyst on right ovary noted measuring 2.8cm.  All questions answered.    GYNECOLOGIC HISTORY: Patient's last menstrual period was 08/08/2015. Contraception: none  Patient Active Problem List   Diagnosis Date Noted  . Fibroids 10/28/2014    Class: History of  . Female infertility 03/19/2014  . Numbness of face 01/04/2014  . Other malaise and fatigue 01/04/2014  . Disturbance of skin sensation 01/04/2014  . Dizziness and giddiness 08/28/2013  . Condyloma 05/31/2013  . Anemia, iron deficiency 12/25/2012  . HYPERTENSION 01/30/2009  . CYSTIC ACNE 01/30/2009  . HYPERSOMNIA 01/30/2009  . PALPITATIONS, HX OF 01/30/2009    Past Medical History  Diagnosis Date  . Hypertension   . Pulmonary embolism ([place]) 2008    secondary to OCP  . Abnormal Pap smear 7/13    ASCUS/positive HR HPV, negative colpo  . Anemia 2006  . Fibroids     with enlarged uterus  . 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 Myomectomy  2003  . Left oophorectomy Left 1998       . Left finger reattachment  age 87  . Myomectomy  2008    via laparoscopy    MEDS:  Reviewed in EPIC and UTD  ALLERGIES: Minocycline hcl and Sulfa antibiotics  Family History  Problem Relation Age of Onset  . Diabetes Mother   . Hypertension Mother   . Heart disease Mother     cardiomegaly  . Lupus Mother   . Diabetes  Father   . Hypertension Father   . Stroke Father   . Hypertension Maternal Grandmother   . Stroke Maternal Grandmother   . Hypertension Sister   . Seizures Sister   . COPD Sister     SH:  Non smoker   Review of Systems  All other systems reviewed and are negative.   PHYSICAL EXAMINATION:    BP 140/80 mmHg  Pulse 70  Resp 18  Ht 5' 4.5" (1.638 m)  Wt 201 lb (91.173 kg)  BMI 33.98 kg/m2  LMP 08/08/2015    General appearance: alert, cooperative and appears stated age Abdomen: soft, non-tender; bowel sounds normal; palpable uterus to 16 weeks size and larger on the right noted,  No other organomegaly  Pelvic: External genitalia:  no lesions              Urethra:  normal appearing urethra with no masses, tenderness or lesions              Bartholins and Skenes: normal                 Vagina: normal appearing vagina with normal color and discharge, no lesions              Cervix: no lesions              Bimanual  Exam:  Uterus:  enlarged, 16 weeks size              Adnexa: no mass, fullness, tenderness              Anus:  normal sphincter tone, no lesions  Endometrial biopsy recommended.  Discussed with patient.  Verbal and written consent obtained.   Procedure:  Speculum placed.  Cervix visualized and cleansed with betadine prep.  A single toothed tenaculum was applied to the anterior lip of the cervix.  Endometrial pipelle was advanced through the cervix into the endometrial cavity without difficulty.  Pipelle passed to 12cm.  Suction applied and pipelle removed with good tissue sample obtained with two passes.  Tenculum removed.  No bleeding noted.  Patient tolerated procedure well.  Chaperone was present for exam.  Assessment: Enlarged fibroid uterus, planning uterine artery embolization 1.3cm right external iliac lymph node noted  Plan: Biopsy results pending.  Pt will be called with this. CBC with differential pending as well.   ~15 minutes spent with patient >50% of  time was in face to face discussion of above.

## 2015-08-26 ENCOUNTER — Telehealth: Payer: Self-pay

## 2015-08-26 NOTE — Telephone Encounter (Signed)
Spoke with Vickie at [facility] regarding scheduling of [place] with Dr.Hoss. Vickie states that Dr.Hoss is on vacation until next Wednesday 09/03/2015. Vickie has patient's EMB results and will have Dr.Hoss review these results prior to scheduling her [place]. Vickie will contact the patient to schedule after reviewing with Dr.Hoss next week. I have notified the patient that she will be contacted next week regarding scheduling of [place]. She is agreeable.  Routing to provider for final review. Patient agreeable to disposition. Will close encounter.

## 2015-09-08 ENCOUNTER — Telehealth: Payer: Self-pay | Admitting: Emergency Medicine

## 2015-09-08 NOTE — Telephone Encounter (Addendum)
-----   Message from Megan Salon, MD sent at 09/06/2015  7:38 AM EDT ----- Regarding: lymph node noted on MRI [facility], Can you let this pt know that I heard back from the radiologist.  He was out of town until Thursday of this past week.  He is the one who is going to do her uterine artery embolization.  On her MRI, she has a mildly enlarged lymph node in the iliac region in her pelvis.   After the embolization, she will have a repeat MRI in six months.  This is the typical recommendation regarding follow-up for a lymph node.  If it is any larger at that time, it will need to be biopsied.  It will be followed for two years and if stable all of that time, then we will stop watching it.   Just wanted her to know we have a plan and it does not need biopsying at this time.

## 2015-09-08 NOTE — Telephone Encounter (Signed)
Call to patient and she is given message from Dr. Sabra Heck.  She verbalizes understanding of message from Dr. Sabra Heck and is agreeable to plan.  She will follow up with [facility] or our office as needed. Routing to provider for final review. Patient agreeable to disposition. Will close encounter.

## 2015-09-21 ENCOUNTER — Emergency Department (HOSPITAL_COMMUNITY)
Admission: EM | Admit: 2015-09-21 | Discharge: 2015-09-21 | Disposition: A | Discharge status: Home / Self Care | Payer: BLUE CROSS/BLUE SHIELD | Attending: Emergency Medicine | Admitting: Emergency Medicine

## 2015-09-21 ENCOUNTER — Encounter (HOSPITAL_COMMUNITY): Payer: Self-pay | Admitting: Emergency Medicine

## 2015-09-21 ENCOUNTER — Emergency Department (HOSPITAL_COMMUNITY): Payer: BLUE CROSS/BLUE SHIELD

## 2015-09-21 DIAGNOSIS — Z8619 Personal history of other infectious and parasitic diseases: Secondary | ICD-10-CM | POA: Insufficient documentation

## 2015-09-21 DIAGNOSIS — R202 Paresthesia of skin: Secondary | ICD-10-CM

## 2015-09-21 DIAGNOSIS — R55 Syncope and collapse: Secondary | ICD-10-CM | POA: Insufficient documentation

## 2015-09-21 DIAGNOSIS — Z86718 Personal history of other venous thrombosis and embolism: Secondary | ICD-10-CM | POA: Diagnosis not present

## 2015-09-21 DIAGNOSIS — D649 Anemia, unspecified: Secondary | ICD-10-CM | POA: Insufficient documentation

## 2015-09-21 DIAGNOSIS — Z79899 Other long term (current) drug therapy: Secondary | ICD-10-CM | POA: Insufficient documentation

## 2015-09-21 DIAGNOSIS — R42 Dizziness and giddiness: Secondary | ICD-10-CM

## 2015-09-21 DIAGNOSIS — I10 Essential (primary) hypertension: Secondary | ICD-10-CM | POA: Diagnosis not present

## 2015-09-21 DIAGNOSIS — Z86018 Personal history of other benign neoplasm: Secondary | ICD-10-CM | POA: Insufficient documentation

## 2015-09-21 LAB — CBC
HEMATOCRIT: 32.2 % — AB (ref 36.0–46.0)
HEMOGLOBIN: 10.1 g/dL — AB (ref 12.0–15.0)
MCH: 26.8 pg (ref 26.0–34.0)
MCHC: 31.4 g/dL (ref 30.0–36.0)
MCV: 85.4 fL (ref 78.0–100.0)
Platelets: 368 10*3/uL (ref 150–400)
RBC: 3.77 MIL/uL — AB (ref 3.87–5.11)
RDW: 14.3 % (ref 11.5–15.5)
WBC: 5.7 10*3/uL (ref 4.0–10.5)

## 2015-09-21 LAB — URINALYSIS, ROUTINE W REFLEX MICROSCOPIC
Bilirubin Urine: NEGATIVE
Glucose, UA: NEGATIVE mg/dL
Hgb urine dipstick: NEGATIVE
Ketones, ur: NEGATIVE mg/dL
Leukocytes, UA: NEGATIVE
NITRITE: NEGATIVE
Protein, ur: NEGATIVE mg/dL
SPECIFIC GRAVITY, URINE: 1.005 (ref 1.005–1.030)
pH: 6.5 (ref 5.0–8.0)

## 2015-09-21 LAB — CBG MONITORING, ED: GLUCOSE-CAPILLARY: 121 mg/dL — AB (ref 65–99)

## 2015-09-21 LAB — BASIC METABOLIC PANEL
ANION GAP: 7 (ref 5–15)
BUN: 10 mg/dL (ref 6–20)
CALCIUM: 9.4 mg/dL (ref 8.9–10.3)
CO2: 29 mmol/L (ref 22–32)
Chloride: 102 mmol/L (ref 101–111)
Creatinine, Ser: 0.74 mg/dL (ref 0.44–1.00)
Glucose, Bld: 115 mg/dL — ABNORMAL HIGH (ref 65–99)
POTASSIUM: 3.5 mmol/L (ref 3.5–5.1)
Sodium: 138 mmol/L (ref 135–145)

## 2015-09-21 NOTE — ED Provider Notes (Signed)
CSN: [id number]     Arrival date & time 09/21/15  1106 History   First MD Initiated Contact with Patient 09/21/15 1210     Chief Complaint  Patient presents with  . Tingling  . Back Pain  . Near Syncope     (Consider location/radiation/quality/duration/timing/severity/associated sxs/prior Treatment) HPI   [name] is a 50 year old female with a past medical history of HTN, PE, recurrent dizziness and paresthesias present to the ED complaining of tingling to left side of her face and near syncope. Patient states she was on her way to church this morning when she felt sudden onset lightheadedness and thought that she was going to pass out. No LOC noted. Patient now has intermittent tingling in the left side of her face and down her left arm as well as across her lower back. Patient states she is experiencing more symptoms in the past and has seen neurology for these issues. She is producing taken Topamax daily which helped her symptoms but she is not taking it lately as it Makes her confused. She missed her last neurology appointment 2 months ago but is scheduled to see her PCP on Monday. Patient denies chest pain, shortness of breath, blurry vision or double vision, vomiting, weakness, slurred speech or facial drooping   Past Medical History  Diagnosis Date  . Hypertension   . Pulmonary embolism ([place]) 2008    secondary to OCP  . Abnormal Pap smear 7/13    ASCUS/positive HR HPV, negative colpo  . Anemia 2006  . Fibroids     with enlarged uterus  . 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 Myomectomy  2003  . Left oophorectomy Left 1998       . Left finger reattachment  age 28  . Myomectomy  2008    via laparoscopy   Family History  Problem Relation Age of Onset  . Diabetes Mother   . Hypertension Mother   . Heart disease Mother     cardiomegaly  . Lupus Mother   . Diabetes Father   . Hypertension Father   . Stroke Father    . Hypertension Maternal Grandmother   . Stroke Maternal Grandmother   . Hypertension Sister   . Seizures Sister   . COPD Sister    Social History  Substance Use Topics  . Smoking status: Never Smoker   . Smokeless tobacco: Never Used  . Alcohol Use: 0.0 oz/week    0 Standard drinks or equivalent per week     Comment: 1 a month   OB History    Gravida Para Term Preterm AB TAB SAB Ectopic Multiple Living   3 0   3     0     Review of Systems  All other systems reviewed and are negative.     Allergies  Minocycline hcl and Sulfa antibiotics  Home Medications   Prior to Admission medications   Medication Sig Start Date End Date Taking? Authorizing Provider  DEXILANT 60 MG capsule Take 60 mg by mouth daily.  09/04/14  Yes Historical Provider, MD  hydrochlorothiazide 25 MG tablet Take 25 mg by mouth daily.     Yes Historical Provider, MD  Iron-FA-B Cmp-C-Biot-Probiotic (FUSION PLUS) CAPS TAKE 1 CAPSULE DAILY 05/06/15  Yes Regina Eck, CNM  Multiple Vitamin (MULTIVITAMIN) capsule Take 1 capsule by mouth daily.   Yes Historical Provider, MD  valsartan (DIOVAN) 320 MG tablet Take 320 mg by  mouth daily.   Yes Historical Provider, MD   BP 145/89 mmHg  Pulse 73  Temp(Src) 98.7 F (37.1 C) (Oral)  Resp 18  SpO2 99% Physical Exam  Constitutional: She is oriented to person, place, and time. She appears well-developed and well-nourished. No distress.  HENT:  Head: Normocephalic and atraumatic.  Mouth/Throat: No oropharyngeal exudate.  Eyes: Conjunctivae and EOM are normal. Pupils are equal, round, and reactive to light. Right eye exhibits no discharge. Left eye exhibits no discharge. No scleral icterus.  Neck: Neck supple. No JVD present.  Cardiovascular: Normal rate, regular rhythm, normal heart sounds and intact distal pulses.  Exam reveals no gallop and no friction rub.   No murmur heard. Pulmonary/Chest: Effort normal and breath sounds normal. No respiratory distress.  She has no wheezes. She has no rales. She exhibits no tenderness.  Abdominal: Soft. She exhibits no distension. There is no tenderness. There is no guarding.  Musculoskeletal: Normal range of motion. She exhibits no edema.  Lymphadenopathy:    She has no cervical adenopathy.  Neurological: She is alert and oriented to person, place, and time. She displays normal reflexes. No cranial nerve deficit. She exhibits normal muscle tone. Coordination normal.  Strength 5/5 throughout. No sensory deficits.  No dysmetria. No Gait abnormality.  Skin: Skin is warm and dry. No rash noted. She is not diaphoretic. No erythema. No pallor.  Psychiatric: She has a normal mood and affect. Her behavior is normal.  Nursing note and vitals reviewed.   ED Course  Procedures (including critical care time) Labs Review Labs Reviewed  BASIC METABOLIC PANEL - Abnormal; Notable for the following:    Glucose, Bld 115 (*)    All other components within normal limits  CBC - Abnormal; Notable for the following:    RBC 3.77 (*)    Hemoglobin 10.1 (*)    HCT 32.2 (*)    All other components within normal limits  CBG MONITORING, ED - Abnormal; Notable for the following:    Glucose-Capillary 121 (*)    All other components within normal limits  URINALYSIS, ROUTINE W REFLEX MICROSCOPIC (NOT AT [facility])    Imaging Review No results found. I have personally reviewed and evaluated these images and lab results as part of my medical decision-making.   EKG Interpretation None      MDM   Final diagnoses:  Lightheadedness  Paresthesias    50 year old female presents to the ED complaining of sudden onset left-sided face tingling and dizziness all the way to church today. Patient appears well in ED, no apparent distress. Vital signs are stable. No neurological deficits noted on exam. Upon review of patient's records, patient has extensive history of similar symptoms. Patient reports that this feels exactly like her  previous episodes. She is been followed by neurology in the past and has had extensive workup including negative MRI brain and CTA. She was previously taken Topamax daily for her symptoms which provided complete relief for her. However, patient states that she stopped taking this couple of months ago as she did not like the way to Topamax made her feel. Feel that patient's symptoms likely return due to noncompliance with Topamax. CT head today negative. All blood work is within normal limits. Recommend the patient continue use of Topamax until she can follow up with neurology to discuss medication adjustment. Feel the patient is safe for discharge with appropriate neurology follow-up. Discussed treatment plan patient who is agreeable. Return precautions outlined in patient discharge instructions.  Case discussed with Dr. Karleen Dolphin who agrees to treatment plan.   Dondra Spry [place], PA-C 09/24/15 [id number]  Varney Biles, MD 09/25/15 [phone]

## 2015-09-21 NOTE — Discharge Instructions (Signed)
Paresthesia Paresthesia is an abnormal burning or prickling sensation. This sensation is generally felt in the hands, arms, legs, or feet. However, it may occur in any part of the body. Usually, it is not painful. The feeling may be described as:  Tingling or numbness.  Pins and needles.  Skin crawling.  Buzzing.  Limbs falling asleep.  Itching. Most people experience temporary (transient) paresthesia at some time in their lives. Paresthesia may occur when you breathe too quickly (hyperventilation). It can also occur without any apparent cause. Commonly, paresthesia occurs when pressure is placed on a nerve. The sensation quickly goes away after the pressure is removed. For some people, however, paresthesia is a long-lasting (chronic) condition that is caused by an underlying disorder. If you continue to have paresthesia, you may need further medical evaluation. HOME CARE INSTRUCTIONS Watch your condition for any changes. Taking the following actions may help to lessen any discomfort that you are feeling:  Avoid drinking alcohol.  Try acupuncture or massage to help relieve your symptoms.  Keep all follow-up visits as directed by your health care provider. This is important. SEEK MEDICAL CARE IF:  You continue to have episodes of paresthesia.  Your burning or prickling feeling gets worse when you walk.  You have pain, cramps, or dizziness.  You develop a rash. SEEK IMMEDIATE MEDICAL CARE IF:  You feel weak.  You have trouble walking or moving.  You have problems with speech, understanding, or vision.  You feel confused.  You cannot control your bladder or bowel movements.  You have numbness after an injury.  You faint.   This information is not intended to replace advice given to you by your health care provider. Make sure you discuss any questions you have with your health care provider.  Your CT scan of your head as well as your lab work was all normal today. Take  your home Topomax to help with your symptoms. Follow up with your neurologist as soon as possible for re-evaluation. Return to the ED if you experience loss of consciousness, weakness, slurred speech, facial drooping, fevers, chills, loss of control of your bowel and your bladder.

## 2015-09-21 NOTE — ED Notes (Signed)
Pt states at 10:45 on the way to church began having tingling to the left side of her face, left side of trunk, and across her lower back. Is not currently on blood thinners, no head injury/fall, denies previous neck or back problems. No obvious neuro deficits in triage, states the tingling has gotten slightly better than it was when it started.

## 2015-09-23 ENCOUNTER — Other Ambulatory Visit: Payer: Self-pay | Admitting: Internal Medicine

## 2015-09-23 DIAGNOSIS — M545 Low back pain: Secondary | ICD-10-CM

## 2015-09-25 ENCOUNTER — Other Ambulatory Visit: Payer: Self-pay | Admitting: Radiology

## 2015-09-26 ENCOUNTER — Other Ambulatory Visit: Payer: Self-pay | Admitting: Radiology

## 2015-09-29 ENCOUNTER — Encounter (HOSPITAL_COMMUNITY): Payer: Self-pay

## 2015-09-29 ENCOUNTER — Ambulatory Visit (HOSPITAL_COMMUNITY)
Admission: RE | Admit: 2015-09-29 | Discharge: 2015-09-29 | Disposition: A | Discharge status: Home / Self Care | Payer: BLUE CROSS/BLUE SHIELD | Source: Ambulatory Visit | Attending: Interventional Radiology | Admitting: Interventional Radiology

## 2015-09-29 ENCOUNTER — Observation Stay (HOSPITAL_COMMUNITY)
Admission: RE | Admit: 2015-09-29 | Discharge: 2015-09-30 | Disposition: A | Discharge status: Home / Self Care | Payer: BLUE CROSS/BLUE SHIELD | Source: Ambulatory Visit | Attending: Interventional Radiology | Admitting: Interventional Radiology

## 2015-09-29 VITALS — BP 143/85 | HR 55 | Temp 98.3°F | Resp 21

## 2015-09-29 DIAGNOSIS — D649 Anemia, unspecified: Secondary | ICD-10-CM | POA: Insufficient documentation

## 2015-09-29 DIAGNOSIS — Z79899 Other long term (current) drug therapy: Secondary | ICD-10-CM | POA: Insufficient documentation

## 2015-09-29 DIAGNOSIS — Z86711 Personal history of pulmonary embolism: Secondary | ICD-10-CM | POA: Insufficient documentation

## 2015-09-29 DIAGNOSIS — D259 Leiomyoma of uterus, unspecified: Secondary | ICD-10-CM | POA: Diagnosis not present

## 2015-09-29 DIAGNOSIS — I10 Essential (primary) hypertension: Secondary | ICD-10-CM | POA: Diagnosis not present

## 2015-09-29 DIAGNOSIS — Z90721 Acquired absence of ovaries, unilateral: Secondary | ICD-10-CM | POA: Diagnosis not present

## 2015-09-29 DIAGNOSIS — D251 Intramural leiomyoma of uterus: Secondary | ICD-10-CM

## 2015-09-29 LAB — BASIC METABOLIC PANEL
Anion gap: 5 (ref 5–15)
BUN: 17 mg/dL (ref 6–20)
CALCIUM: 9.2 mg/dL (ref 8.9–10.3)
CHLORIDE: 104 mmol/L (ref 101–111)
CO2: 28 mmol/L (ref 22–32)
CREATININE: 0.7 mg/dL (ref 0.44–1.00)
Glucose, Bld: 93 mg/dL (ref 65–99)
Potassium: 3.6 mmol/L (ref 3.5–5.1)
SODIUM: 137 mmol/L (ref 135–145)

## 2015-09-29 LAB — CBC WITH DIFFERENTIAL/PLATELET
BASOS ABS: 0 10*3/uL (ref 0.0–0.1)
BASOS PCT: 0 %
EOS ABS: 0.1 10*3/uL (ref 0.0–0.7)
Eosinophils Relative: 1 %
HCT: 33.1 % — ABNORMAL LOW (ref 36.0–46.0)
HEMOGLOBIN: 10.2 g/dL — AB (ref 12.0–15.0)
Lymphocytes Relative: 34 %
Lymphs Abs: 1.7 10*3/uL (ref 0.7–4.0)
MCH: 26.6 pg (ref 26.0–34.0)
MCHC: 30.8 g/dL (ref 30.0–36.0)
MCV: 86.4 fL (ref 78.0–100.0)
Monocytes Absolute: 0.4 10*3/uL (ref 0.1–1.0)
Monocytes Relative: 8 %
NEUTROS PCT: 57 %
Neutro Abs: 2.9 10*3/uL (ref 1.7–7.7)
Platelets: 270 10*3/uL (ref 150–400)
RBC: 3.83 MIL/uL — AB (ref 3.87–5.11)
RDW: 14.7 % (ref 11.5–15.5)
WBC: 5.1 10*3/uL (ref 4.0–10.5)

## 2015-09-29 LAB — PROTIME-INR
INR: 1.16 (ref 0.00–1.49)
PROTHROMBIN TIME: 15 s (ref 11.6–15.2)

## 2015-09-29 LAB — HCG, SERUM, QUALITATIVE: PREG SERUM: NEGATIVE

## 2015-09-29 LAB — APTT: aPTT: 27 seconds (ref 24–37)

## 2015-09-29 MED ORDER — KETOROLAC TROMETHAMINE 30 MG/ML IJ SOLN
30.0000 mg | Freq: Four times a day (QID) | INTRAMUSCULAR | Status: DC
  Administered 2015-09-29 – 2015-09-30 (×3): 30 mg via INTRAVENOUS
  Filled 2015-09-29 (×3): qty 1

## 2015-09-29 MED ORDER — IOHEXOL 300 MG/ML  SOLN: 100.0000 mL | Freq: Once | INTRAMUSCULAR | Status: DC | PRN

## 2015-09-29 MED ORDER — HYDROMORPHONE 1 MG/ML IV SOLN
INTRAVENOUS | Status: AC
  Administered 2015-09-30: 0 mg via INTRAVENOUS
  Filled 2015-09-29: qty 25

## 2015-09-29 MED ORDER — DIPHENHYDRAMINE HCL 50 MG/ML IJ SOLN: 12.5000 mg | Freq: Four times a day (QID) | INTRAMUSCULAR | Status: DC | PRN

## 2015-09-29 MED ORDER — FENTANYL CITRATE (PF) 100 MCG/2ML IJ SOLN
50.0000 ug | Freq: Once | INTRAMUSCULAR | Status: AC
  Administered 2015-09-29: 12.5 ug via INTRAVENOUS
  Filled 2015-09-29: qty 2

## 2015-09-29 MED ORDER — SODIUM CHLORIDE 0.9 % IV SOLN: 250.0000 mL | INTRAVENOUS | Status: DC | PRN

## 2015-09-29 MED ORDER — HYDROMORPHONE 1 MG/ML IV SOLN
INTRAVENOUS | Status: DC
  Administered 2015-09-29: 14:00:00 via INTRAVENOUS
  Administered 2015-09-29: 1.2 mg via INTRAVENOUS
  Administered 2015-09-29: 0.6 mg via INTRAVENOUS
  Administered 2015-09-30: 1.2 mg via INTRAVENOUS

## 2015-09-29 MED ORDER — PROMETHAZINE HCL 25 MG PO TABS: 25.0000 mg | ORAL_TABLET | Freq: Three times a day (TID) | ORAL | Status: DC | PRN

## 2015-09-29 MED ORDER — IRBESARTAN 150 MG PO TABS
300.0000 mg | ORAL_TABLET | Freq: Every day | ORAL | Status: DC
  Filled 2015-09-29 (×2): qty 2

## 2015-09-29 MED ORDER — ONDANSETRON HCL 4 MG/2ML IJ SOLN: 4.0000 mg | Freq: Four times a day (QID) | INTRAMUSCULAR | Status: DC | PRN

## 2015-09-29 MED ORDER — HYDROCHLOROTHIAZIDE 25 MG PO TABS
25.0000 mg | ORAL_TABLET | Freq: Every day | ORAL | Status: DC
Start: 2015-09-29 — End: 2015-09-30
  Filled 2015-09-29 (×2): qty 1

## 2015-09-29 MED ORDER — FENTANYL CITRATE (PF) 100 MCG/2ML IJ SOLN
INTRAMUSCULAR | Status: AC
  Filled 2015-09-29: qty 4

## 2015-09-29 MED ORDER — SODIUM CHLORIDE 0.9% FLUSH: 3.0000 mL | INTRAVENOUS | Status: DC | PRN

## 2015-09-29 MED ORDER — PROMETHAZINE HCL 25 MG RE SUPP: 25.0000 mg | Freq: Three times a day (TID) | RECTAL | Status: DC | PRN

## 2015-09-29 MED ORDER — DOCUSATE SODIUM 100 MG PO CAPS
100.0000 mg | ORAL_CAPSULE | Freq: Two times a day (BID) | ORAL | Status: DC
  Administered 2015-09-29 – 2015-09-30 (×3): 100 mg via ORAL
  Filled 2015-09-29 (×3): qty 1

## 2015-09-29 MED ORDER — PANTOPRAZOLE SODIUM 40 MG PO TBEC
40.0000 mg | DELAYED_RELEASE_TABLET | Freq: Every day | ORAL | Status: DC
  Filled 2015-09-29 (×2): qty 1

## 2015-09-29 MED ORDER — IOPAMIDOL (ISOVUE-300) INJECTION 61%
100.0000 mL | Freq: Once | INTRAVENOUS | Status: AC | PRN
  Administered 2015-09-29: 30 mL via INTRA_ARTERIAL

## 2015-09-29 MED ORDER — FENTANYL CITRATE (PF) 100 MCG/2ML IJ SOLN
INTRAMUSCULAR | Status: AC | PRN
  Administered 2015-09-29 (×2): 25 ug via INTRAVENOUS
  Administered 2015-09-29: 50 ug via INTRAVENOUS

## 2015-09-29 MED ORDER — LIDOCAINE HCL 1 % IJ SOLN
INTRAMUSCULAR | Status: AC
  Filled 2015-09-29: qty 20

## 2015-09-29 MED ORDER — MIDAZOLAM HCL 2 MG/2ML IJ SOLN
INTRAMUSCULAR | Status: AC | PRN
  Administered 2015-09-29 (×7): 0.5 mg via INTRAVENOUS
  Administered 2015-09-29: 1 mg via INTRAVENOUS
  Administered 2015-09-29 (×2): 0.5 mg via INTRAVENOUS

## 2015-09-29 MED ORDER — MIDAZOLAM HCL 2 MG/2ML IJ SOLN
INTRAMUSCULAR | Status: AC
  Filled 2015-09-29: qty 6

## 2015-09-29 MED ORDER — FENTANYL CITRATE (PF) 100 MCG/2ML IJ SOLN
12.5000 ug | Freq: Once | INTRAMUSCULAR | Status: AC
  Administered 2015-09-29: 12.5 ug via INTRAVENOUS

## 2015-09-29 MED ORDER — DIPHENHYDRAMINE HCL 12.5 MG/5ML PO ELIX: 12.5000 mg | ORAL_SOLUTION | Freq: Four times a day (QID) | ORAL | Status: DC | PRN

## 2015-09-29 MED ORDER — SODIUM CHLORIDE 0.9% FLUSH: 9.0000 mL | INTRAVENOUS | Status: DC | PRN

## 2015-09-29 MED ORDER — SODIUM CHLORIDE 0.9 % IV SOLN
INTRAVENOUS | Status: DC
  Administered 2015-09-29: 10:00:00 via INTRAVENOUS

## 2015-09-29 MED ORDER — NALOXONE HCL 0.4 MG/ML IJ SOLN: 0.4000 mg | INTRAMUSCULAR | Status: DC | PRN

## 2015-09-29 MED ORDER — IOPAMIDOL (ISOVUE-300) INJECTION 61%
100.0000 mL | Freq: Once | INTRAVENOUS | Status: AC | PRN
  Administered 2015-09-29: 40 mL via INTRA_ARTERIAL

## 2015-09-29 MED ORDER — IOPAMIDOL (ISOVUE-300) INJECTION 61%
100.0000 mL | Freq: Once | INTRAVENOUS | Status: AC | PRN
  Administered 2015-09-29: 35 mL via INTRAVENOUS

## 2015-09-29 MED ORDER — KETOROLAC TROMETHAMINE 30 MG/ML IJ SOLN
30.0000 mg | INTRAMUSCULAR | Status: AC
  Administered 2015-09-29: 30 mg via INTRAVENOUS
  Filled 2015-09-29: qty 1

## 2015-09-29 MED ORDER — HYDROMORPHONE HCL 2 MG/ML IJ SOLN
INTRAMUSCULAR | Status: AC
  Filled 2015-09-29: qty 1

## 2015-09-29 MED ORDER — SODIUM CHLORIDE 0.9% FLUSH
3.0000 mL | Freq: Two times a day (BID) | INTRAVENOUS | Status: DC
  Administered 2015-09-29: 3 mL via INTRAVENOUS

## 2015-09-29 MED ORDER — CEFAZOLIN SODIUM-DEXTROSE 2-4 GM/100ML-% IV SOLN
2.0000 g | INTRAVENOUS | Status: AC
  Administered 2015-09-29: 2 g via INTRAVENOUS
  Filled 2015-09-29: qty 100

## 2015-09-29 NOTE — Sedation Documentation (Signed)
Patient denies pain and is resting comfortably.  

## 2015-09-29 NOTE — H&P (Signed)
Referring Physician(s): Megan Salon  Supervising Physician: Marybelle Killings  Patient Status: OP TBA  Chief Complaint:   Symptomatic uterine fibroids  Subjective: Pt familiar to IR service from prior consultation with Dr. Barbie Banner on 12/04/14 for treatment options regarding symptomatic uterine fibroids. She was deemed an appropriate candidate for bilateral uterine artery embolization and presents today for the procedure. She currently denies fever, HA, CP, dyspnea, cough, abd/back pain, N/V or hematuria/dysuria. She does have menorrhagia.  Additional hx as below.  Past Medical History  Diagnosis Date  . Hypertension   . Pulmonary embolism ([place]) 2008    secondary to OCP  . Abnormal Pap smear 7/13    ASCUS/positive HR HPV, negative colpo  . Anemia 2006  . Fibroids     with enlarged uterus  . 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 Myomectomy  2003  . Left oophorectomy Left 1998       . Left finger reattachment  age 56  . Myomectomy  2008    via laparoscopy      Allergies: Minocycline hcl and Sulfa antibiotics  Medications: Prior to Admission medications   Medication Sig Start Date End Date Taking? Authorizing Provider  DEXILANT 60 MG capsule Take 60 mg by mouth daily.  09/04/14  Yes Historical Provider, MD  hydrochlorothiazide 25 MG tablet Take 25 mg by mouth daily.     Yes Historical Provider, MD  Iron-FA-B Cmp-C-Biot-Probiotic (FUSION PLUS) CAPS TAKE 1 CAPSULE DAILY 05/06/15  Yes Regina Eck, CNM  Multiple Vitamin (MULTIVITAMIN) capsule Take 1 capsule by mouth daily.   Yes Historical Provider, MD  valsartan (DIOVAN) 320 MG tablet Take 320 mg by mouth daily.   Yes Historical Provider, MD     Vital Signs: BP 128/76 mmHg  Pulse 64  Temp(Src) 98.6 F (37 C) (Oral)  Resp 16  SpO2 99%  LMP 09/02/2015  Physical Exam awake/alert; chest- CTA bilat; heart- RRR; abd- soft,+BS,NT; ext- no edema, intact distal  pulses  Imaging: No results found.  Labs:  CBC:  Recent Labs  04/30/15 0901 08/19/15 1646 09/21/15 1155 09/29/15 0958  WBC 5.9 6.7 5.7 5.1  HGB 9.4* 9.8* 10.1* 10.2*  HCT 31.3* 31.0* 32.2* 33.1*  PLT 365 371 368 270    COAGS:  Recent Labs  09/29/15 0958  INR 1.16  APTT 27    BMP:  Recent Labs  12/08/14 0310 09/21/15 1155 09/29/15 0958  NA 136 138 137  K 3.3* 3.5 3.6  CL 102 102 104  CO2 28 29 28   GLUCOSE 105* 115* 93  BUN 15 10 17   CALCIUM 9.2 9.4 9.2  CREATININE 0.74 0.74 0.70  GFRNONAA >60 >60 >60  GFRAA >60 >60 >60    LIVER FUNCTION TESTS:  Recent Labs  12/08/14 0310  BILITOT 0.5  AST 22  ALT 25  ALKPHOS 63  PROT 8.2*  ALBUMIN 3.8    Assessment and Plan: Pt with hx symptomatic uterine fibroids/anemia and prior myomectomy 2003/2008; seen in consultation by Dr. Barbie Banner on 12/04/14 for treatment options and deemed candidate for bilateral uterine artery embolization. She presents today for the procedure. Details/risks of procedure, including but not limited, internal bleeding, infection, contrast nephropathy, nontarget embolization discussed with pt/husband with their understanding and consent. Postprocedure she will be admitted for overnight observation.    Electronically Signed: D. Rowe Robert 09/29/2015, 10:42 AM   I spent a total of 30 minutes at the the patient's  bedside AND on the patient's hospital floor or unit, greater than 50% of which was counseling/coordinating care for bilateral uterine artery embolization

## 2015-09-29 NOTE — Sedation Documentation (Signed)
37fr sheath removed from right fem artery by Dr. Barbie Banner. Hemostasis achieved using exoseal closure devise and manual pressure held by Lambert Mody, RTR for 2 minutes. Groin level 0, RDP +3, RPT +3.

## 2015-09-29 NOTE — Progress Notes (Signed)
Patient ID: [name], female   DOB: Jan 24, 1966, 50 y.o.   MRN: [id number]    Referring Physician(s): Megan Salon  Supervising Physician: Marybelle Killings  Patient Status: In-pt  Chief Complaint:  Symptomatic uterine fibroids  Subjective:  Pt doing fairly well; has some occ pelvic cramps; denies N/V  Allergies: Sulfa antibiotics and Minocycline hcl  Medications: Prior to Admission medications   Medication Sig Start Date End Date Taking? Authorizing Provider  dexlansoprazole (DEXILANT) 60 MG capsule Take 60 mg by mouth daily.   Yes Historical Provider, MD  hydrochlorothiazide 25 MG tablet Take 25 mg by mouth daily.     Yes Historical Provider, MD  Iron-FA-B Cmp-C-Biot-Probiotic (FUSION PLUS) CAPS TAKE 1 CAPSULE DAILY 05/06/15  Yes Regina Eck, CNM  Multiple Vitamin (MULTIVITAMIN) capsule Take 1 capsule by mouth daily.   Yes Historical Provider, MD  Probiotic Product (PROBIOTIC DAILY) CAPS Take 1 capsule by mouth daily.   Yes Historical Provider, MD  valsartan (DIOVAN) 320 MG tablet Take 320 mg by mouth daily.   Yes Historical Provider, MD     Vital Signs: BP 132/81 mmHg  Pulse 57  Temp(Src) 97.9 F (36.6 C) (Oral)  Resp 18  SpO2 97%  LMP 09/02/2015  Physical Exam awake/alert; abd soft,+BS,mild pelvic tenderness; rt CFA puncture site soft,clean, NT, no hematoma; intact distal pulses  Imaging: Ir Angiogram Pelvis Selective Or Supraselective  09/29/2015  INDICATION: Uterine fibroids EXAM: UTERINE ARTERY EMBOLIZATION MEDICATIONS: . The antibiotic was administered within 1 hour of the procedure ANESTHESIA/SEDATION: Fentanyl 100 mcg IV; Versed 5.5 mg IV semi: Toward all 30 mg Moderate Sedation Time:  51 The patient was continuously monitored during the procedure by the interventional radiology nurse under my direct supervision. CONTRAST:  105 cc Isovue 300 FLUOROSCOPY TIME:  Fluoroscopy Time: 29 minutes 24 seconds (2180 mGy). COMPLICATIONS: None immediate.  PROCEDURE: Informed consent was obtained from the patient following explanation of the procedure, risks, benefits and alternatives. The patient understands, agrees and consents for the procedure. All questions were addressed. A time out was performed prior to the initiation of the procedure. Maximal barrier sterile technique utilized including caps, mask, sterile gowns, sterile gloves, large sterile drape, hand hygiene, and Betadine prep. Vessels selected:  Bilateral third order iliac artery. The right groin was prepped with ChloraPrep in a sterile fashion, and a sterile drape was applied covering the operative field. A sterile gown and sterile gloves were used for the procedure. A micropuncture needle was inserted into the right common femoral artery and removed over a 018 wire which was up sized to a [place]. A 5-French sheath was inserted. Cobra II catheter was advanced into the aorta. The contralateral left common iliac artery was selected. The internal iliac artery on the left was selected. Angiography was performed. A micro catheter was advanced over an 018 glide wire into the left uterine artery. Angiography was performed. Embolization was performed utilizing 2 vials 500-700 micron embospheres and 2 vials of 700-900 micron embospheres. The micro catheter was removed and flushed. A Waltman's loop was created. The ipsilateral right common iliac artery and right internal iliac artery were selected. Angiography was performed. A micro catheter was advanced over a 0018 glide wire into the right uterine artery. Angiography was performed. Embolization was again performed with 2 vials 500-700 micron embospheres an 1 vial of 709 100 micron embospheres. The micro catheter and Cobra catheter were removed. Right femoral angiography was performed. A EXOSEAL device was deployed without complication and hemostasis was achieved. FINDINGS:  Imaging demonstrates bilateral pelvic anatomy and bilateral uterine artery angiography.  Expected anatomy is identified. No blood supply from the uterine artery to the vagina is visualized. Post embolization angiography demonstrates relative stasis of flow and pruning of the vasculature to the uterus. IMPRESSION: Successful bilateral uterine artery embolization. Electronically Signed   By: Marybelle Killings M.D.   On: 09/29/2015 14:49   Ir Angiogram Pelvis Selective Or Supraselective  09/29/2015  INDICATION: Uterine fibroids EXAM: UTERINE ARTERY EMBOLIZATION MEDICATIONS: . The antibiotic was administered within 1 hour of the procedure ANESTHESIA/SEDATION: Fentanyl 100 mcg IV; Versed 5.5 mg IV semi: Toward all 30 mg Moderate Sedation Time:  40 The patient was continuously monitored during the procedure by the interventional radiology nurse under my direct supervision. CONTRAST:  105 cc Isovue 300 FLUOROSCOPY TIME:  Fluoroscopy Time: 29 minutes 24 seconds (2180 mGy). COMPLICATIONS: None immediate. PROCEDURE: Informed consent was obtained from the patient following explanation of the procedure, risks, benefits and alternatives. The patient understands, agrees and consents for the procedure. All questions were addressed. A time out was performed prior to the initiation of the procedure. Maximal barrier sterile technique utilized including caps, mask, sterile gowns, sterile gloves, large sterile drape, hand hygiene, and Betadine prep. Vessels selected:  Bilateral third order iliac artery. The right groin was prepped with ChloraPrep in a sterile fashion, and a sterile drape was applied covering the operative field. A sterile gown and sterile gloves were used for the procedure. A micropuncture needle was inserted into the right common femoral artery and removed over a 018 wire which was up sized to a [place]. A 5-French sheath was inserted. Cobra II catheter was advanced into the aorta. The contralateral left common iliac artery was selected. The internal iliac artery on the left was selected. Angiography was  performed. A micro catheter was advanced over an 018 glide wire into the left uterine artery. Angiography was performed. Embolization was performed utilizing 2 vials 500-700 micron embospheres and 2 vials of 700-900 micron embospheres. The micro catheter was removed and flushed. A Waltman's loop was created. The ipsilateral right common iliac artery and right internal iliac artery were selected. Angiography was performed. A micro catheter was advanced over a 0018 glide wire into the right uterine artery. Angiography was performed. Embolization was again performed with 2 vials 500-700 micron embospheres an 1 vial of 709 100 micron embospheres. The micro catheter and Cobra catheter were removed. Right femoral angiography was performed. A EXOSEAL device was deployed without complication and hemostasis was achieved. FINDINGS: Imaging demonstrates bilateral pelvic anatomy and bilateral uterine artery angiography. Expected anatomy is identified. No blood supply from the uterine artery to the vagina is visualized. Post embolization angiography demonstrates relative stasis of flow and pruning of the vasculature to the uterus. IMPRESSION: Successful bilateral uterine artery embolization. Electronically Signed   By: Marybelle Killings M.D.   On: 09/29/2015 14:49   Ir Angiogram Selective Each Additional Vessel  09/29/2015  INDICATION: Uterine fibroids EXAM: UTERINE ARTERY EMBOLIZATION MEDICATIONS: . The antibiotic was administered within 1 hour of the procedure ANESTHESIA/SEDATION: Fentanyl 100 mcg IV; Versed 5.5 mg IV semi: Toward all 30 mg Moderate Sedation Time:  64 The patient was continuously monitored during the procedure by the interventional radiology nurse under my direct supervision. CONTRAST:  105 cc Isovue 300 FLUOROSCOPY TIME:  Fluoroscopy Time: 29 minutes 24 seconds (2180 mGy). COMPLICATIONS: None immediate. PROCEDURE: Informed consent was obtained from the patient following explanation of the procedure, risks,  benefits and alternatives. The patient  understands, agrees and consents for the procedure. All questions were addressed. A time out was performed prior to the initiation of the procedure. Maximal barrier sterile technique utilized including caps, mask, sterile gowns, sterile gloves, large sterile drape, hand hygiene, and Betadine prep. Vessels selected:  Bilateral third order iliac artery. The right groin was prepped with ChloraPrep in a sterile fashion, and a sterile drape was applied covering the operative field. A sterile gown and sterile gloves were used for the procedure. A micropuncture needle was inserted into the right common femoral artery and removed over a 018 wire which was up sized to a [place]. A 5-French sheath was inserted. Cobra II catheter was advanced into the aorta. The contralateral left common iliac artery was selected. The internal iliac artery on the left was selected. Angiography was performed. A micro catheter was advanced over an 018 glide wire into the left uterine artery. Angiography was performed. Embolization was performed utilizing 2 vials 500-700 micron embospheres and 2 vials of 700-900 micron embospheres. The micro catheter was removed and flushed. A Waltman's loop was created. The ipsilateral right common iliac artery and right internal iliac artery were selected. Angiography was performed. A micro catheter was advanced over a 0018 glide wire into the right uterine artery. Angiography was performed. Embolization was again performed with 2 vials 500-700 micron embospheres an 1 vial of 709 100 micron embospheres. The micro catheter and Cobra catheter were removed. Right femoral angiography was performed. A EXOSEAL device was deployed without complication and hemostasis was achieved. FINDINGS: Imaging demonstrates bilateral pelvic anatomy and bilateral uterine artery angiography. Expected anatomy is identified. No blood supply from the uterine artery to the vagina is visualized. Post  embolization angiography demonstrates relative stasis of flow and pruning of the vasculature to the uterus. IMPRESSION: Successful bilateral uterine artery embolization. Electronically Signed   By: Marybelle Killings M.D.   On: 09/29/2015 14:49   Ir Angiogram Selective Each Additional Vessel  09/29/2015  INDICATION: Uterine fibroids EXAM: UTERINE ARTERY EMBOLIZATION MEDICATIONS: . The antibiotic was administered within 1 hour of the procedure ANESTHESIA/SEDATION: Fentanyl 100 mcg IV; Versed 5.5 mg IV semi: Toward all 30 mg Moderate Sedation Time:  67 The patient was continuously monitored during the procedure by the interventional radiology nurse under my direct supervision. CONTRAST:  105 cc Isovue 300 FLUOROSCOPY TIME:  Fluoroscopy Time: 29 minutes 24 seconds (2180 mGy). COMPLICATIONS: None immediate. PROCEDURE: Informed consent was obtained from the patient following explanation of the procedure, risks, benefits and alternatives. The patient understands, agrees and consents for the procedure. All questions were addressed. A time out was performed prior to the initiation of the procedure. Maximal barrier sterile technique utilized including caps, mask, sterile gowns, sterile gloves, large sterile drape, hand hygiene, and Betadine prep. Vessels selected:  Bilateral third order iliac artery. The right groin was prepped with ChloraPrep in a sterile fashion, and a sterile drape was applied covering the operative field. A sterile gown and sterile gloves were used for the procedure. A micropuncture needle was inserted into the right common femoral artery and removed over a 018 wire which was up sized to a [place]. A 5-French sheath was inserted. Cobra II catheter was advanced into the aorta. The contralateral left common iliac artery was selected. The internal iliac artery on the left was selected. Angiography was performed. A micro catheter was advanced over an 018 glide wire into the left uterine artery. Angiography was  performed. Embolization was performed utilizing 2 vials 500-700 micron embospheres and  2 vials of 700-900 micron embospheres. The micro catheter was removed and flushed. A Waltman's loop was created. The ipsilateral right common iliac artery and right internal iliac artery were selected. Angiography was performed. A micro catheter was advanced over a 0018 glide wire into the right uterine artery. Angiography was performed. Embolization was again performed with 2 vials 500-700 micron embospheres an 1 vial of 709 100 micron embospheres. The micro catheter and Cobra catheter were removed. Right femoral angiography was performed. A EXOSEAL device was deployed without complication and hemostasis was achieved. FINDINGS: Imaging demonstrates bilateral pelvic anatomy and bilateral uterine artery angiography. Expected anatomy is identified. No blood supply from the uterine artery to the vagina is visualized. Post embolization angiography demonstrates relative stasis of flow and pruning of the vasculature to the uterus. IMPRESSION: Successful bilateral uterine artery embolization. Electronically Signed   By: Marybelle Killings M.D.   On: 09/29/2015 14:49   Ir US Guide Vasc Access Right  09/29/2015  INDICATION: Uterine fibroids EXAM: UTERINE ARTERY EMBOLIZATION MEDICATIONS: . The antibiotic was administered within 1 hour of the procedure ANESTHESIA/SEDATION: Fentanyl 100 mcg IV; Versed 5.5 mg IV semi: Toward all 30 mg Moderate Sedation Time:  8 The patient was continuously monitored during the procedure by the interventional radiology nurse under my direct supervision. CONTRAST:  105 cc Isovue 300 FLUOROSCOPY TIME:  Fluoroscopy Time: 29 minutes 24 seconds (2180 mGy). COMPLICATIONS: None immediate. PROCEDURE: Informed consent was obtained from the patient following explanation of the procedure, risks, benefits and alternatives. The patient understands, agrees and consents for the procedure. All questions were addressed. A time  out was performed prior to the initiation of the procedure. Maximal barrier sterile technique utilized including caps, mask, sterile gowns, sterile gloves, large sterile drape, hand hygiene, and Betadine prep. Vessels selected:  Bilateral third order iliac artery. The right groin was prepped with ChloraPrep in a sterile fashion, and a sterile drape was applied covering the operative field. A sterile gown and sterile gloves were used for the procedure. A micropuncture needle was inserted into the right common femoral artery and removed over a 018 wire which was up sized to a [place]. A 5-French sheath was inserted. Cobra II catheter was advanced into the aorta. The contralateral left common iliac artery was selected. The internal iliac artery on the left was selected. Angiography was performed. A micro catheter was advanced over an 018 glide wire into the left uterine artery. Angiography was performed. Embolization was performed utilizing 2 vials 500-700 micron embospheres and 2 vials of 700-900 micron embospheres. The micro catheter was removed and flushed. A Waltman's loop was created. The ipsilateral right common iliac artery and right internal iliac artery were selected. Angiography was performed. A micro catheter was advanced over a 0018 glide wire into the right uterine artery. Angiography was performed. Embolization was again performed with 2 vials 500-700 micron embospheres an 1 vial of 709 100 micron embospheres. The micro catheter and Cobra catheter were removed. Right femoral angiography was performed. A EXOSEAL device was deployed without complication and hemostasis was achieved. FINDINGS: Imaging demonstrates bilateral pelvic anatomy and bilateral uterine artery angiography. Expected anatomy is identified. No blood supply from the uterine artery to the vagina is visualized. Post embolization angiography demonstrates relative stasis of flow and pruning of the vasculature to the uterus. IMPRESSION:  Successful bilateral uterine artery embolization. Electronically Signed   By: Marybelle Killings M.D.   On: 09/29/2015 14:49   Ir Embo Tumor Organ Ischemia Infarct Inc Guide Roadmapping  09/29/2015  INDICATION: Uterine fibroids EXAM: UTERINE ARTERY EMBOLIZATION MEDICATIONS: . The antibiotic was administered within 1 hour of the procedure ANESTHESIA/SEDATION: Fentanyl 100 mcg IV; Versed 5.5 mg IV semi: Toward all 30 mg Moderate Sedation Time:  79 The patient was continuously monitored during the procedure by the interventional radiology nurse under my direct supervision. CONTRAST:  105 cc Isovue 300 FLUOROSCOPY TIME:  Fluoroscopy Time: 29 minutes 24 seconds (2180 mGy). COMPLICATIONS: None immediate. PROCEDURE: Informed consent was obtained from the patient following explanation of the procedure, risks, benefits and alternatives. The patient understands, agrees and consents for the procedure. All questions were addressed. A time out was performed prior to the initiation of the procedure. Maximal barrier sterile technique utilized including caps, mask, sterile gowns, sterile gloves, large sterile drape, hand hygiene, and Betadine prep. Vessels selected:  Bilateral third order iliac artery. The right groin was prepped with ChloraPrep in a sterile fashion, and a sterile drape was applied covering the operative field. A sterile gown and sterile gloves were used for the procedure. A micropuncture needle was inserted into the right common femoral artery and removed over a 018 wire which was up sized to a [place]. A 5-French sheath was inserted. Cobra II catheter was advanced into the aorta. The contralateral left common iliac artery was selected. The internal iliac artery on the left was selected. Angiography was performed. A micro catheter was advanced over an 018 glide wire into the left uterine artery. Angiography was performed. Embolization was performed utilizing 2 vials 500-700 micron embospheres and 2 vials of 700-900  micron embospheres. The micro catheter was removed and flushed. A Waltman's loop was created. The ipsilateral right common iliac artery and right internal iliac artery were selected. Angiography was performed. A micro catheter was advanced over a 0018 glide wire into the right uterine artery. Angiography was performed. Embolization was again performed with 2 vials 500-700 micron embospheres an 1 vial of 709 100 micron embospheres. The micro catheter and Cobra catheter were removed. Right femoral angiography was performed. A EXOSEAL device was deployed without complication and hemostasis was achieved. FINDINGS: Imaging demonstrates bilateral pelvic anatomy and bilateral uterine artery angiography. Expected anatomy is identified. No blood supply from the uterine artery to the vagina is visualized. Post embolization angiography demonstrates relative stasis of flow and pruning of the vasculature to the uterus. IMPRESSION: Successful bilateral uterine artery embolization. Electronically Signed   By: Marybelle Killings M.D.   On: 09/29/2015 14:49    Labs:  CBC:  Recent Labs  04/30/15 0901 08/19/15 1646 09/21/15 1155 09/29/15 0958  WBC 5.9 6.7 5.7 5.1  HGB 9.4* 9.8* 10.1* 10.2*  HCT 31.3* 31.0* 32.2* 33.1*  PLT 365 371 368 270    COAGS:  Recent Labs  09/29/15 0958  INR 1.16  APTT 27    BMP:  Recent Labs  12/08/14 0310 09/21/15 1155 09/29/15 0958  NA 136 138 137  K 3.3* 3.5 3.6  CL 102 102 104  CO2 28 29 28   GLUCOSE 105* 115* 93  BUN 15 10 17   CALCIUM 9.2 9.4 9.2  CREATININE 0.74 0.74 0.70  GFRNONAA >60 >60 >60  GFRAA >60 >60 >60    LIVER FUNCTION TESTS:  Recent Labs  12/08/14 0310  BILITOT 0.5  AST 22  ALT 25  ALKPHOS 63  PROT 8.2*  ALBUMIN 3.8    Assessment and Plan:  S/p bilat [place] today secondary to symptomatic uterine fibroids; for overnight obs; dilaudid PCA for pain; f/u with Dr.  Hoss in IR clinic in 2-4 weeks  Electronically Signed: D. Rowe Robert 09/29/2015, 4:49 PM   I spent a total of 15 minutes at the the patient's bedside AND on the patient's hospital floor or unit, greater than 50% of which was counseling/coordinating care for bilateral uterine artery embolization

## 2015-09-29 NOTE — Discharge Instructions (Signed)
Uterine Artery Embolization for Fibroids, Care After °Refer to this sheet in the next few weeks. These instructions provide you with information on caring for yourself after your procedure. Your health care provider may also give you more specific instructions. Your treatment has been planned according to current medical practices, but problems sometimes occur. Call your health care provider if you have any problems or questions after your procedure. °WHAT TO EXPECT AFTER THE PROCEDURE °After your procedure, it is typical to have cramping in the pelvis. You will be given pain medicine to control it. °HOME CARE INSTRUCTIONS °· Only take over-the-counter or prescription medicines for pain, discomfort, or fever as directed by your health care provider. °· Do not take aspirin. It can cause bleeding. °· Follow your health care provider's advice regarding medicines given to you, diet, activity, and when to begin sexual activity. °· See your health care provider for follow-up care as directed. °SEEK MEDICAL CARE IF: °· You have a fever. °· You have redness, swelling, and pain around your incision site. °· You have pus draining from your incision. °· You have a rash. °SEEK IMMEDIATE MEDICAL CARE IF: °· You have bleeding from your incision site. °· You have difficulty breathing. °· You have chest pain. °· You have severe abdominal pain. °· You have leg pain. °· You become dizzy and faint. °  °This information is not intended to replace advice given to you by your health care provider. Make sure you discuss any questions you have with your health care provider. °  °Document Released: 02/21/2013 Document Reviewed: 02/21/2013 °Elsevier Interactive Patient Education ©2016 [employer] ° °

## 2015-09-29 NOTE — Procedures (Signed)
B UAE No comp/EBL 

## 2015-09-30 DIAGNOSIS — D259 Leiomyoma of uterus, unspecified: Secondary | ICD-10-CM | POA: Diagnosis not present

## 2015-09-30 MED ORDER — ONDANSETRON HCL 8 MG PO TABS: 8.0000 mg | ORAL_TABLET | Freq: Three times a day (TID) | ORAL | Status: DC | PRN

## 2015-09-30 MED ORDER — DOCUSATE SODIUM 100 MG PO CAPS: 100.0000 mg | ORAL_CAPSULE | Freq: Two times a day (BID) | ORAL | Status: DC

## 2015-09-30 MED ORDER — OXYCODONE-ACETAMINOPHEN 5-325 MG PO TABS: 1.0000 | ORAL_TABLET | ORAL | Status: DC | PRN

## 2015-09-30 MED ORDER — IBUPROFEN 200 MG PO TABS: 600.0000 mg | ORAL_TABLET | Freq: Three times a day (TID) | ORAL | Status: DC | PRN

## 2015-09-30 NOTE — Progress Notes (Signed)
Patient d/c instructions and prescriptions given, verbalized understanding. Pain is controlled, pain level of 2/10. Ambulated in the hallway before d/c. Patient is stable. R groin dsg intact, no bleeding.D/c home.

## 2015-09-30 NOTE — Discharge Summary (Signed)
   Patient ID: Kalle Wiebold MRN: [id number] DOB/AGE: [birth year] 50 y.o.  Admit date: 09/29/2015 Discharge date: 09/30/2015  Supervising Physician: Arne Cleveland  Admission Diagnoses: uterine leiomyoma   Discharge Diagnoses:  Active Problems:   Uterine leiomyoma   Discharged Condition: good  Hospital Course: the patient was admitted and taken to the IR suite where she underwent a [place].  She tolerated this procedure well.  She had minimal pain after the procedure, for which she mostly wants to take ibuprofen for.  She denies any nausea.  She is voiding and ambulating already on POD 1.  She is otherwise stable for dc home.  Consults: None  Treatments: IV hydration, antibiotics: Ancef and analgesia: Dilaudid and percocet  Discharge Exam: Blood pressure 143/85, pulse 55, temperature 98.3 F (36.8 C), temperature source Oral, resp. rate 21, last menstrual period 09/02/2015, SpO2 100 %. General appearance: alert and cooperative Resp: clear to auscultation bilaterally Cardio: regular rate and rhythm GI: soft, non-tender; bowel sounds normal; no masses,  no organomegaly and right inguinal puncture site is c/d/i with no edema or hemotoma present.  Disposition: 01-Home or Self Care     Medication List    TAKE these medications        dexlansoprazole 60 MG capsule  Commonly known as:  DEXILANT  Take 60 mg by mouth daily.     docusate sodium 100 MG capsule  Commonly known as:  COLACE  Take 1 capsule (100 mg total) by mouth 2 (two) times daily.     FUSION PLUS Caps  TAKE 1 CAPSULE DAILY     hydrochlorothiazide 25 MG tablet  Commonly known as:  HYDRODIURIL  Take 25 mg by mouth daily.     ibuprofen 200 MG tablet  Commonly known as:  ADVIL  Take 3 tablets (600 mg total) by mouth every 8 (eight) hours as needed.     multivitamin capsule  Take 1 capsule by mouth daily.     ondansetron 8 MG tablet  Commonly known as:  ZOFRAN  Take 1 tablet (8 mg total) by mouth  every 8 (eight) hours as needed for nausea or vomiting.     oxyCODONE-acetaminophen 5-325 MG tablet  Commonly known as:  PERCOCET/ROXICET  Take 1-2 tablets by mouth every 4 (four) hours as needed for moderate pain.     PROBIOTIC DAILY Caps  Take 1 capsule by mouth daily.     valsartan 320 MG tablet  Commonly known as:  DIOVAN  Take 320 mg by mouth daily.           Follow-up Information    Follow up with HOSS, ART A, MD In 4 weeks.   Specialty:  Interventional Radiology   Why:  our office will call with appointment time   Contact information:   [address] [place] [address] [id number]        Electronically Signed: Saverio Danker E 09/30/2015, 9:14 AM   I have spent Less Than 30 Minutes discharging [place].

## 2015-10-01 ENCOUNTER — Ambulatory Visit
Admission: RE | Admit: 2015-10-01 | Discharge: 2015-10-01 | Disposition: A | Discharge status: Home / Self Care | Payer: BLUE CROSS/BLUE SHIELD | Source: Ambulatory Visit | Attending: Internal Medicine | Admitting: Internal Medicine

## 2015-10-01 DIAGNOSIS — M545 Low back pain: Secondary | ICD-10-CM

## 2015-10-23 ENCOUNTER — Ambulatory Visit
Admission: RE | Admit: 2015-10-23 | Discharge: 2015-10-23 | Disposition: A | Discharge status: Home / Self Care | Payer: BLUE CROSS/BLUE SHIELD | Source: Ambulatory Visit | Attending: General Surgery | Admitting: General Surgery

## 2015-10-23 DIAGNOSIS — D259 Leiomyoma of uterus, unspecified: Secondary | ICD-10-CM

## 2015-10-23 NOTE — Progress Notes (Signed)
Patient ID: [name], female   DOB: 05-May-1966, 50 y.o.   MRN: [id number]   Referring Physician(s): Hale Bogus  Chief Complaint: The patient is seen in follow up today s/p [place] by Dr. Barbie Banner on 09-29-15.  History of present illness:  This is a 50 yo female who underwent a uterine artery embolization on 09-29-15.  She tolerated this procedure well.  She had no pain in the hospital, but had some the first few days post procedure.  She took her pain medication for only about 3 days.  She ran some low grade fevers, but these dissipated about 4-5 days after the procedure as well.  She had her cycle about 1 week after the procedure.  There was a small amount of change already at this time.  She has had very minimal spotting at times, but this is dissipating as well.  She has not passed any solid material.  She is back at work, which is from home, and back to her normal activities.  She is voiding and moving her bowels well.  She presents today for her 1 month follow up.  Past Medical History  Diagnosis Date  . Hypertension   . Pulmonary embolism ([place]) 2008    secondary to OCP  . Abnormal Pap smear 7/13    ASCUS/positive HR HPV, negative colpo  . Anemia 2006  . Fibroids     with enlarged uterus  . 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 Myomectomy  2003  . Left oophorectomy Left 1998       . Left finger reattachment  age 83  . Myomectomy  2008    via laparoscopy    Allergies: Sulfa antibiotics and Minocycline hcl  Medications: Prior to Admission medications   Medication Sig Start Date End Date Taking? Authorizing Provider  dexlansoprazole (DEXILANT) 60 MG capsule Take 60 mg by mouth daily.   Yes Historical Provider, MD  hydrochlorothiazide 25 MG tablet Take 25 mg by mouth daily.     Yes Historical Provider, MD  ibuprofen (ADVIL) 200 MG tablet Take 3 tablets (600 mg total) by mouth every 8 (eight) hours as needed. 09/30/15   Yes Saverio Danker, PA-C  Iron-FA-B Cmp-C-Biot-Probiotic (FUSION PLUS) CAPS TAKE 1 CAPSULE DAILY 05/06/15  Yes Regina Eck, CNM  Multiple Vitamin (MULTIVITAMIN) capsule Take 1 capsule by mouth daily.   Yes Historical Provider, MD  valsartan (DIOVAN) 320 MG tablet Take 320 mg by mouth daily.   Yes Historical Provider, MD  docusate sodium (COLACE) 100 MG capsule Take 1 capsule (100 mg total) by mouth 2 (two) times daily. Patient not taking: Reported on 10/23/2015 09/30/15   Saverio Danker, PA-C  ondansetron (ZOFRAN) 8 MG tablet Take 1 tablet (8 mg total) by mouth every 8 (eight) hours as needed for nausea or vomiting. Patient not taking: Reported on 10/23/2015 09/30/15   Saverio Danker, PA-C  oxyCODONE-acetaminophen (PERCOCET/ROXICET) 5-325 MG tablet Take 1-2 tablets by mouth every 4 (four) hours as needed for moderate pain. Patient not taking: Reported on 10/23/2015 09/30/15   Saverio Danker, PA-C  Probiotic Product (PROBIOTIC DAILY) CAPS Take 1 capsule by mouth daily. Reported on 10/23/2015    Historical Provider, MD     Family History  Problem Relation Age of Onset  . Diabetes Mother   . Hypertension Mother   . Heart disease Mother     cardiomegaly  . Lupus Mother   . Diabetes Father   .  Hypertension Father   . Stroke Father   . Hypertension Maternal Grandmother   . Stroke Maternal Grandmother   . Hypertension Sister   . Seizures Sister   . COPD Sister     Social History   Social History  . Marital Status: Married    Spouse Name: N/A  . Number of Children: 0  . Years of Education: BA   Occupational History  . Warden/ranger    Social History Main Topics  . Smoking status: Never Smoker   . Smokeless tobacco: Never Used  . Alcohol Use: 0.0 oz/week    0 Standard drinks or equivalent per week     Comment: 1 a month  . Drug Use: No  . Sexual Activity:    Partners: Male    Birth Control/ Protection: None   Other Topics Concern  . Not on file   Social History Narrative    Patient is single with no children.   Patient is right handed.   Patient has a BA degree.   Patient drinks 1 cup daily.     Vital Signs: BP 152/100 mmHg  Pulse 80  Temp(Src) 98.6 F (37 C) (Oral)  Resp 14  Ht 5\' 5"  (1.651 m)  Wt 195 lb (88.451 kg)  BMI 32.45 kg/m2  SpO2 100%  LMP 10/06/2015 (Approximate)  Physical Exam  Gen: pleasant, WD, WN black female who is in NAD Heart: regular rate and rhythm Lungs: CTAB Abd: soft, NT, ND, +BS Ext: right groin site is healed well with no evidence of hematoma.  She has +2 palpable pedal pulses bilaterally  Imaging: No results found.  Labs:  CBC:  Recent Labs  04/30/15 0901 08/19/15 1646 09/21/15 1155 09/29/15 0958  WBC 5.9 6.7 5.7 5.1  HGB 9.4* 9.8* 10.1* 10.2*  HCT 31.3* 31.0* 32.2* 33.1*  PLT 365 371 368 270    COAGS:  Recent Labs  09/29/15 0958  INR 1.16  APTT 27    BMP:  Recent Labs  12/08/14 0310 09/21/15 1155 09/29/15 0958  NA 136 138 137  K 3.3* 3.5 3.6  CL 102 102 104  CO2 28 29 28   GLUCOSE 105* 115* 93  BUN 15 10 17   CALCIUM 9.2 9.4 9.2  CREATININE 0.74 0.74 0.70  GFRNONAA >60 >60 >60  GFRAA >60 >60 >60    LIVER FUNCTION TESTS:  Recent Labs  12/08/14 0310  BILITOT 0.5  AST 22  ALT 25  ALKPHOS 63  PROT 8.2*  ALBUMIN 3.8    Assessment:  1. S/p [place] by Dr. Barbie Banner on 09-29-15  The patient is doing very well after her procedure.  She has no complaints or questions at this time.  She is encouraged to continue her normal activities, but to give [place] a call in the interim if she develops or has concerns regarding, worsening pain, fevers, or passing large amounts of solid matter.  We discussed that it generally takes about 6 months to see the full benefit of this procedure.  We will plan to contact her about 3 months from her procedure and if she is doing well we will see her in clinic at 6 months with no MRI.  If she is having any issues when we contact her we can see her sooner than her 6  month follow up.  If she is having persistent symptoms, then she would likely require an MRI prior to her 6 month follow up.  She understands.  Signed: Henreitta Cea 10/23/2015, 9:30  AM   Please refer to Dr. Barbie Banner attestation of this note for management and plan.

## 2015-12-23 ENCOUNTER — Telehealth: Payer: Self-pay | Admitting: Obstetrics & Gynecology

## 2015-12-23 NOTE — Telephone Encounter (Signed)
Opened in error

## 2015-12-24 ENCOUNTER — Other Ambulatory Visit: Payer: Self-pay | Admitting: Obstetrics & Gynecology

## 2015-12-24 ENCOUNTER — Other Ambulatory Visit (HOSPITAL_COMMUNITY): Payer: Self-pay | Admitting: Interventional Radiology

## 2015-12-24 DIAGNOSIS — D259 Leiomyoma of uterus, unspecified: Secondary | ICD-10-CM

## 2015-12-31 NOTE — Telephone Encounter (Signed)
error 

## 2016-01-06 ENCOUNTER — Ambulatory Visit
Admission: RE | Admit: 2016-01-06 | Discharge: 2016-01-06 | Disposition: A | Discharge status: Home / Self Care | Payer: BLUE CROSS/BLUE SHIELD | Source: Ambulatory Visit | Attending: Interventional Radiology | Admitting: Interventional Radiology

## 2016-01-06 DIAGNOSIS — D259 Leiomyoma of uterus, unspecified: Secondary | ICD-10-CM

## 2016-01-06 HISTORY — PX: IR GENERIC HISTORICAL: IMG1180011

## 2016-01-06 NOTE — Progress Notes (Signed)
Patient ID: [name], female   DOB: 03-29-1966, 50 y.o.   MRN: [id number]    Chief Complaint: Patient was seen in consultation today for  Chief Complaint  Patient presents with  . Follow-up    3 mo follow up [place]     at the request of Jullianna Gabor  Referring Physician(s): Becky Berberian  Supervising Physician: Marybelle Killings  Patient Status: Outpatient  History of Present Illness: [name] is a 50 y.o. female who is 3 months status post uterine artery embolization. After the procedure, she experienced postembolization syndrome with some fevers and malaise. This resolved and today she is pain-free. Her menorrhagia has dramatically improved. Bloating has also improved. She did pass a fibroid fragment 1 month ago. She denies any associated bleeding. She does feel that she is perimenopausal with early hot flashes. She is doing very well and is satisfied with her preliminary results. Her last hemoglobin was 10 on the day of her procedure. She has not had a follow-up hemoglobin yet.  Past Medical History:  Diagnosis Date  . Abnormal Pap smear 7/13   ASCUS/positive HR HPV, negative colpo  . Anemia 2006  . 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 Fibroids    with enlarged uterus  . Hypertension   . Pulmonary embolism ([place]) 2008   secondary to [facility]    Past Surgical History:  Procedure Laterality Date  . left finger reattachment  age 45  . LEFT OOPHORECTOMY Left 1998      . MYOMECTOMY  2003  . MYOMECTOMY  2008   via laparoscopy    Allergies: Sulfa antibiotics and Minocycline hcl  Medications: Prior to Admission medications   Medication Sig Start Date End Date Taking? Authorizing Provider  dexlansoprazole (DEXILANT) 60 MG capsule Take 60 mg by mouth daily.   Yes Historical Provider, MD  hydrochlorothiazide 25 MG tablet Take 25 mg by mouth daily.     Yes Historical Provider, MD  ibuprofen (ADVIL) 200 MG tablet Take 3 tablets (600 mg total) by mouth every 8 (eight)  hours as needed. 09/30/15  Yes Saverio Danker, PA-C  Iron-FA-B Cmp-C-Biot-Probiotic (FUSION PLUS) CAPS TAKE 1 CAPSULE DAILY 05/06/15  Yes Regina Eck, CNM  Multiple Vitamin (MULTIVITAMIN) capsule Take 1 capsule by mouth daily.   Yes Historical Provider, MD  Probiotic Product (PROBIOTIC DAILY) CAPS Take 1 capsule by mouth daily. Reported on 10/23/2015   Yes Historical Provider, MD  valsartan (DIOVAN) 320 MG tablet Take 320 mg by mouth daily.   Yes Historical Provider, MD  docusate sodium (COLACE) 100 MG capsule Take 1 capsule (100 mg total) by mouth 2 (two) times daily. Patient not taking: Reported on 10/23/2015 09/30/15   Saverio Danker, PA-C  ondansetron (ZOFRAN) 8 MG tablet Take 1 tablet (8 mg total) by mouth every 8 (eight) hours as needed for nausea or vomiting. Patient not taking: Reported on 10/23/2015 09/30/15   Saverio Danker, PA-C  oxyCODONE-acetaminophen (PERCOCET/ROXICET) 5-325 MG tablet Take 1-2 tablets by mouth every 4 (four) hours as needed for moderate pain. Patient not taking: Reported on 10/23/2015 09/30/15   Saverio Danker, PA-C     Family History  Problem Relation Age of Onset  . Diabetes Mother   . Hypertension Mother   . Heart disease Mother     cardiomegaly  . Lupus Mother   . Diabetes Father   . Hypertension Father   . Stroke Father   . Hypertension Maternal Grandmother   . Stroke Maternal Grandmother   . Hypertension  Sister   . Seizures Sister   . COPD Sister     Social History   Social History  . Marital status: Married    Spouse name: N/A  . Number of children: 0  . 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 Smoking status: Never Smoker  . Smokeless tobacco: Never Used  . Alcohol use 0.0 oz/week     Comment: 1 a month  . Drug use: No  . Sexual activity: Yes    Partners: Male    Birth control/ protection: None   Other Topics Concern  . Not on file   Social History Narrative    Patient is single with no children.   Patient is right handed.   Patient has a BA degree.   Patient drinks 1 cup daily.     Review of Systems: A 12 point ROS discussed and pertinent positives are indicated in the HPI above.  All other systems are negative.  Review of Systems  Vital Signs: BP (!) 140/91 (BP Location: Left Arm, Patient Position: Sitting, Cuff Size: Normal)   Pulse 73   Temp 98.9 F (37.2 C) (Oral)   LMP 12/29/2015 (Approximate)   SpO2 100%   Physical Exam She is in no apparent distress. Mood and affect are normal   Imaging: No results found.  Labs:  CBC:  Recent Labs  04/30/15 0901 08/19/15 1646 09/21/15 1155 09/29/15 0958  WBC 5.9 6.7 5.7 5.1  HGB 9.4* 9.8* 10.1* 10.2*  HCT 31.3* 31.0* 32.2* 33.1*  PLT 365 371 368 270    COAGS:  Recent Labs  09/29/15 0958  INR 1.16  APTT 27    BMP:  Recent Labs  09/21/15 1155 09/29/15 0958  NA 138 137  K 3.5 3.6  CL 102 104  CO2 29 28  GLUCOSE 115* 93  BUN 10 17  CALCIUM 9.4 9.2  CREATININE 0.74 0.70  GFRNONAA >60 >60  GFRAA >60 >60    LIVER FUNCTION TESTS: No results for input(s): BILITOT, AST, ALT, ALKPHOS, PROT, ALBUMIN in the last 8760 hours.  TUMOR MARKERS: No results for input(s): AFPTM, CEA, CA199, CHROMGRNA in the last 8760 hours.  Assessment and Plan:  Ms. Jarman has done very well after uterine fibroid embolization. Her symptoms are already improving. She did have a hemorrhagic cyst in her right ovary and this will need to be followed. She should also benefit from a repeat hemoglobin level. We will see her at the six-month interval and hopefully her symptoms will continue to improve.  Thank you for this interesting consult.  I greatly enjoyed meeting Hatley Delao Cruise and look forward to participating in their care.  A copy of this report was sent to the requesting provider on this date.  Electronically Signed: Alfio Loescher, ART A 01/06/2016, 10:27 AM   I spent a total of  15  Minutes in face to face in clinical consultation, greater than 50% of which was counseling/coordinating care for uterine fibroid embolization.

## 2016-01-23 ENCOUNTER — Encounter: Payer: Self-pay | Admitting: Obstetrics & Gynecology

## 2016-01-23 ENCOUNTER — Ambulatory Visit (INDEPENDENT_AMBULATORY_CARE_PROVIDER_SITE_OTHER): Payer: BLUE CROSS/BLUE SHIELD | Admitting: Obstetrics & Gynecology

## 2016-01-23 VITALS — BP 124/72 | HR 72 | Resp 18 | Ht 64.5 in | Wt 204.0 lb

## 2016-01-23 DIAGNOSIS — D500 Iron deficiency anemia secondary to blood loss (chronic): Secondary | ICD-10-CM | POA: Diagnosis not present

## 2016-01-23 DIAGNOSIS — Z0000 Encounter for general adult medical examination without abnormal findings: Secondary | ICD-10-CM | POA: Diagnosis not present

## 2016-01-23 DIAGNOSIS — Z01419 Encounter for gynecological examination (general) (routine) without abnormal findings: Secondary | ICD-10-CM | POA: Diagnosis not present

## 2016-01-23 DIAGNOSIS — Z124 Encounter for screening for malignant neoplasm of cervix: Secondary | ICD-10-CM | POA: Diagnosis not present

## 2016-01-23 LAB — COMPREHENSIVE METABOLIC PANEL
ALK PHOS: 66 U/L (ref 33–115)
ALT: 17 U/L (ref 6–29)
AST: 18 U/L (ref 10–35)
Albumin: 3.9 g/dL (ref 3.6–5.1)
BILIRUBIN TOTAL: 0.3 mg/dL (ref 0.2–1.2)
BUN: 13 mg/dL (ref 7–25)
CALCIUM: 9.6 mg/dL (ref 8.6–10.2)
CO2: 26 mmol/L (ref 20–31)
Chloride: 103 mmol/L (ref 98–110)
Creat: 0.72 mg/dL (ref 0.50–1.10)
Glucose, Bld: 90 mg/dL (ref 65–99)
POTASSIUM: 3.7 mmol/L (ref 3.5–5.3)
Sodium: 137 mmol/L (ref 135–146)
TOTAL PROTEIN: 8.8 g/dL — AB (ref 6.1–8.1)

## 2016-01-23 LAB — LIPID PANEL
Cholesterol: 177 mg/dL (ref 125–200)
HDL: 47 mg/dL (ref >=46)
LDL Cholesterol: 105 mg/dL (ref <130)
Total CHOL/HDL Ratio: 3.8 Ratio (ref <=5.0)
Triglycerides: 124 mg/dL (ref <150)
VLDL: 25 mg/dL (ref <30)

## 2016-01-23 LAB — CBC
HEMATOCRIT: 35 % (ref 35.0–45.0)
HEMOGLOBIN: 11.3 g/dL — AB (ref 11.7–15.5)
MCH: 26.6 pg — AB (ref 27.0–33.0)
MCHC: 32.3 g/dL (ref 32.0–36.0)
MCV: 82.4 fL (ref 80.0–100.0)
MPV: 10.3 fL (ref 7.5–12.5)
Platelets: 296 10*3/uL (ref 140–400)
RBC: 4.25 MIL/uL (ref 3.80–5.10)
RDW: 15 % (ref 11.0–15.0)
WBC: 5.5 10*3/uL (ref 3.8–10.8)

## 2016-01-23 LAB — FERRITIN: Ferritin: 19 ng/mL (ref 10–232)

## 2016-01-23 LAB — IRON: Iron: 59 ug/dL (ref 40–190)

## 2016-01-23 LAB — TSH: TSH: 1.44 m[IU]/L

## 2016-01-23 NOTE — Progress Notes (Signed)
50 y.o. G3P0030 MarriedAfrican AmericanF here for annual exam.  Doing well.  Cycles are still regular.  Flow is much, much better.  She will have a repeat MRI in November.  Bloating is better.    PCP:  Dr. Jonelle Sidle.  Last visit was in May.  Last blood work was over a year ago.  Patient's last menstrual period was 12/29/2015 (approximate).          Sexually active: Yes.    The current method of family planning is none.    Exercising: No.  The patient does not participate in regular exercise at present. Smoker:  no  Health Maintenance: Pap:  10/28/14 Neg. 12/04/12 Neg. HR HPV:neg  History of abnormal Pap:  yes MMG:  07/08/14 BIRADS1:neg  Colonoscopy:  never BMD:   Never TDaP:  10/2012 Hep C testing: 10/28/14 Neg  Screening Labs: obtained today, Hb today: obtained today, Urine today: Normal   reports that she has never smoked. She has never used smokeless tobacco. She reports that she drinks alcohol. She reports that she does not use drugs.  Past Medical History:  Diagnosis Date  . Abnormal Pap smear 7/13   ASCUS/positive HR HPV, negative colpo  . Anemia 2006  . 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 Fibroids    with enlarged uterus  . Hypertension   . Pulmonary embolism ([place]) 2008   secondary to [facility]    Past Surgical History:  Procedure Laterality Date  . left finger reattachment  age 5  . LEFT OOPHORECTOMY Left 1998      . MYOMECTOMY  2003  . MYOMECTOMY  2008   via laparoscopy    Family History  Problem Relation Age of Onset  . Diabetes Mother   . Hypertension Mother   . Heart disease Mother     cardiomegaly  . Lupus Mother   . Diabetes Father   . Hypertension Father   . Stroke Father   . Hypertension Maternal Grandmother   . Stroke Maternal Grandmother   . Hypertension Sister   . Seizures Sister   . COPD Sister     ROS:  Pertinent items are noted in HPI.  Otherwise, a comprehensive ROS was negative.  Exam:   Vitals:   01/23/16 1501  BP: 124/72  Pulse: 72   Resp: 18  Weight: 204 lb (92.5 kg)  Height: 5' 4.5" (1.638 m)   General appearance: alert, cooperative and appears stated age Head: Normocephalic, without obvious abnormality, atraumatic Neck: no adenopathy, supple, symmetrical, trachea midline and thyroid normal to inspection and palpation Lungs: clear to auscultation bilaterally Breasts: normal appearance, no masses or tenderness Heart: regular rate and rhythm Abdomen: soft, non-tender; bowel sounds normal; no masses,  no organomegaly Extremities: extremities normal, atraumatic, no cyanosis or edema Skin: Skin color, texture, turgor normal. No rashes or lesions Lymph nodes: Cervical, supraclavicular, and axillary nodes normal. No abnormal inguinal nodes palpated Neurologic: Grossly normal   Pelvic: External genitalia:  no lesions              Urethra:  normal appearing urethra with no masses, tenderness or lesions              Bartholins and Skenes: normal                 Vagina: normal appearing vagina with normal color and discharge, no lesions              Cervix: no lesions  Pap taken: No. Bimanual Exam:  Uterus:  about 12 weeks in size and globular.  Decreased size and increased mobility since prior exam.              Adnexa: normal adnexa and no mass, fullness, tenderness               Rectovaginal: Confirms               Anus:  normal sphincter tone, no lesions  Chaperone was present for exam.  A:  Well Woman with normal exam H/O uterine fibroids s/p [place] 5/17, myomectomy x 2 in 2003 and 2008 Hypertension H/O condyloma, no findings today H/o chronic anemia and low ferritin H/o PE H/O ASCUS pap with +HR HPV 2013.  Neg HR HPV and neg pap 2015.   P:           Mammogram guildlines reviewed.  Pt knows to schedule Pap and HR HPV obtained today   CBC, iron, and ferritin today CMP, Lipids, TSH, Vit D as well obtained today Has follow up with IR in November with MRI AEX 1 year or follow up prn uate intake of  calcium and vitamin D, diet and exercise

## 2016-01-24 LAB — VITAMIN D 25 HYDROXY (VIT D DEFICIENCY, FRACTURES): Vit D, 25-Hydroxy: 24 ng/mL — ABNORMAL LOW (ref 30–100)

## 2016-01-27 LAB — IPS PAP TEST WITH HPV

## 2016-01-28 ENCOUNTER — Telehealth: Payer: Self-pay | Admitting: *Deleted

## 2016-01-28 NOTE — Telephone Encounter (Signed)
LM to call back.

## 2016-01-28 NOTE — Telephone Encounter (Signed)
Pt notified.  Verbalized understanding.

## 2016-01-28 NOTE — Telephone Encounter (Signed)
-----   Message from Megan Salon, MD sent at 01/28/2016 10:53 AM EDT ----- Inform pt her pap is negative and HR HPV is negative as well.  02 recall.  Also, let her know her pap showed some yeast.  Ok to treat with diflucan 150mg  po x 1, repeat 72 hours if having symptoms.  #2/0RF.  Thanks.

## 2016-03-25 ENCOUNTER — Other Ambulatory Visit (HOSPITAL_COMMUNITY): Payer: Self-pay | Admitting: Interventional Radiology

## 2016-03-25 DIAGNOSIS — D259 Leiomyoma of uterus, unspecified: Secondary | ICD-10-CM

## 2016-03-31 ENCOUNTER — Ambulatory Visit
Admission: RE | Admit: 2016-03-31 | Discharge: 2016-03-31 | Disposition: A | Discharge status: Home / Self Care | Payer: BLUE CROSS/BLUE SHIELD | Source: Ambulatory Visit | Attending: Interventional Radiology | Admitting: Interventional Radiology

## 2016-03-31 ENCOUNTER — Other Ambulatory Visit: Payer: Self-pay | Admitting: Obstetrics & Gynecology

## 2016-03-31 DIAGNOSIS — D259 Leiomyoma of uterus, unspecified: Secondary | ICD-10-CM

## 2016-03-31 HISTORY — PX: IR GENERIC HISTORICAL: IMG1180011

## 2016-03-31 NOTE — Progress Notes (Signed)
Patient ID: [name], female   DOB: July 31, 1965, 50 y.o.   MRN: [id number]       Chief Complaint: 6 month status post uterine fibroid embolization  Referring Physician(s): [name]  History of Present Illness: [name] is a 50 y.o. female who is 6 months status post uterine fibroid embolization. She was treated for menorrhagia. Her abnormal menstrual bleeding has resolved and she feels much better. She has also noticed a significant improvement in the bulk symptoms within her abdomen. She feels great. She denies any fevers or chills. The pretreatment MRI, in addition to enhancing uterine fibroids, demonstrated a hemorrhagic cyst in the right ovary as well as a borderline enlarged right external iliac lymph node with a short axis diameter of 1.3 cm.  Past Medical History:  Diagnosis Date  . Abnormal Pap smear 7/13   ASCUS/positive HR HPV, negative colpo  . Anemia 2006  . 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 Fibroids    with enlarged uterus  . Hypertension   . Pulmonary embolism ([place]) 2008   secondary to [facility]    Past Surgical History:  Procedure Laterality Date  . left finger reattachment  age 32  . LEFT OOPHORECTOMY Left 1998      . MYOMECTOMY  2003  . MYOMECTOMY  2008   via laparoscopy    Allergies: Minocycline hcl and Sulfa antibiotics  Medications: Prior to Admission medications   Medication Sig Start Date End Date Taking? Authorizing Provider  hydrochlorothiazide 25 MG tablet Take 25 mg by mouth daily.     Yes Historical Provider, MD  ketoconazole (NIZORAL) 2 % cream  12/29/15  Yes Historical Provider, MD  TAZORAC 0.05 % cream  12/29/15  Yes Historical Provider, MD  valsartan (DIOVAN) 320 MG tablet Take 320 mg by mouth daily.   Yes Historical Provider, MD  dexlansoprazole (DEXILANT) 60 MG capsule Take 60 mg by mouth daily.    Historical Provider, MD  Multiple Vitamin (MULTIVITAMIN) capsule Take 1 capsule by mouth daily.    Historical Provider,  MD  Probiotic Product (PROBIOTIC DAILY) CAPS Take 1 capsule by mouth daily. Reported on 10/23/2015    Historical Provider, MD     Family History  Problem Relation Age of Onset  . Diabetes Mother   . Hypertension Mother   . Heart disease Mother     cardiomegaly  . Lupus Mother   . Diabetes Father   . Hypertension Father   . Stroke Father   . Hypertension Maternal Grandmother   . Stroke Maternal Grandmother   . Hypertension Sister   . Seizures Sister   . COPD Sister     Social History   Social History  . Marital status: Married    Spouse name: N/A  . Number of children: 0  . 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 Smoking status: Never Smoker  . Smokeless tobacco: Never Used  . Alcohol use 0.0 oz/week     Comment: 1 a month  . Drug use: No  . Sexual activity: Yes    Partners: Male    Birth control/ protection: None   Other Topics Concern  . Not on file   Social History Narrative   Patient is single with no children.   Patient is right handed.   Patient has a BA degree.   Patient drinks 1 cup daily.     Review of Systems: A  12 point ROS discussed and pertinent positives are indicated in the HPI above.  All other systems are negative.  Review of Systems  Vital Signs: BP (!) 147/77 (BP Location: Right Arm, Patient Position: Sitting, Cuff Size: Normal)   Pulse 82   Temp 98.3 F (36.8 C)   Resp 18   LMP 03/21/2016 (Exact Date)   SpO2 99%   Physical Exam  Constitutional: She appears well-developed and well-nourished.    Mallampati Score:     Imaging: No results found.  Labs:  CBC:  Recent Labs  08/19/15 1646 09/21/15 1155 09/29/15 0958 01/23/16 1619  WBC 6.7 5.7 5.1 5.5  HGB 9.8* 10.1* 10.2* 11.3*  HCT 31.0* 32.2* 33.1* 35.0  PLT 371 368 270 296    COAGS:  Recent Labs  09/29/15 0958  INR 1.16  APTT 27    BMP:  Recent Labs  09/21/15 1155 09/29/15 0958  01/23/16 1619  NA 138 137 137  K 3.5 3.6 3.7  CL 102 104 103  CO2 29 28 26   GLUCOSE 115* 93 90  BUN 10 17 13   CALCIUM 9.4 9.2 9.6  CREATININE 0.74 0.70 0.72  GFRNONAA >60 >60  --   GFRAA >60 >60  --     LIVER FUNCTION TESTS:  Recent Labs  01/23/16 1619  BILITOT 0.3  AST 18  ALT 17  ALKPHOS 66  PROT 8.8*  ALBUMIN 3.9    TUMOR MARKERS: No results for input(s): AFPTM, CEA, CA199, CHROMGRNA in the last 8760 hours.  Assessment and Plan:  [name] has done very well after uterine fibroid embolization, and her symptoms have resolved. She is concerned about the borderline enlarged lymph node. A post uterine artery embolization MRI with contrast will be performed. The fibroids can be assessed as well as the lymph node and hemorrhagic cyst.   Electronically Signed: Alanni Poole, ART A 03/31/2016, 3:52 PM   I spent a total of   15 Minutes in face to face in clinical consultation, greater than 50% of which was counseling/coordinating care for uterine fibroid embolization.*

## 2016-04-06 ENCOUNTER — Encounter: Payer: Self-pay | Admitting: Interventional Radiology

## 2016-04-19 ENCOUNTER — Ambulatory Visit
Admission: RE | Admit: 2016-04-19 | Discharge: 2016-04-19 | Disposition: A | Discharge status: Home / Self Care | Payer: BLUE CROSS/BLUE SHIELD | Source: Ambulatory Visit | Attending: Obstetrics & Gynecology | Admitting: Obstetrics & Gynecology

## 2016-04-19 DIAGNOSIS — D259 Leiomyoma of uterus, unspecified: Secondary | ICD-10-CM

## 2016-04-19 MED ORDER — GADOBENATE DIMEGLUMINE 529 MG/ML IV SOLN
19.0000 mL | Freq: Once | INTRAVENOUS | Status: AC | PRN
  Administered 2016-04-19: 19 mL via INTRAVENOUS

## 2016-04-20 ENCOUNTER — Telehealth: Payer: Self-pay | Admitting: *Deleted

## 2016-04-20 NOTE — Telephone Encounter (Signed)
Call to patient. MRI results reviewed with patient as seen below from Dr. Sabra Heck. Patient verbalized understanding, but states she works in [employer] and is concerned by what is meant by "reactive lymph node." RN advised she would review with Dr. Sabra Heck and return call to patient. Patient's AEX rescheduled to Dr. Ammie Ferrier schedule for Monday 01/24/17 at 1400. Patient agreeable to date and time of appointment.    Routing to provider for review.

## 2016-04-20 NOTE — Telephone Encounter (Signed)
-----   Message from Megan Salon, MD sent at 04/20/2016  6:36 AM EST ----- Pt had uterine artery embolization with interventional radiology earlier this year.  She saw Dr. Barbie Banner (IR) on 03/31/16 for follow up.  She is doing well.  Please let pt know her pelvic MRI showed the uterus to have decreased from a volum of 1590 cm3 to 993cm3 and the largest fibroid have decreased from 8.7 x 5.8 x 7.3cm to 6.2 x 4.5 x 5.6cm.    She had a hemorrhagic cyst on the right ovary with the last MRI.  This is resolve.  Lastly, the 1.3cm lymph node that was seen is completely stable and is likely a "reactive lymph node".  Nothing else is needed for this.  She has an AEX with Debbi in September.  Can you please change it to my schedule?  Thanks.

## 2016-04-21 NOTE — Telephone Encounter (Signed)
If a lymph node is enlarged, it will either be "reactive" or have malignant appearance.  Reactive just means it is enlarged but looks normal.  She's had two MRIs, eight months apart, and the lymph node hasn't changed.  The radiologist who did her procedure did not think it looked abnormal and does not need additional follow up.  If she's like to see someone else about this, I'm happy to refer her.  Thanks.

## 2016-04-22 NOTE — Telephone Encounter (Signed)
Call to patient. Message given to patient as seen below from Dr. Sabra Heck. Patient verbalized understanding, but states she would like to be referred to someone else "just to be sure." RN advised this message would sent to Dr. Sabra Heck for review and to place referral. Patient aware our referral coordinator, Jacqlyn Larsen, would be in touch with her about referral.   Routing to provider for review.    Cc Theresia Lo

## 2016-04-30 NOTE — Telephone Encounter (Signed)
Message left to return call to Etan Vasudevan at [phone].    

## 2016-05-18 NOTE — Telephone Encounter (Signed)
Call to patient. After discussion with Dr. Sabra Heck, called patient to let her know that Dr. Sabra Heck had discussed case with 2 different hematologist/oncologists, neither of whom felt the patient needed to be seen. Patient states "that seems to be a second opinion in and of it's self." Patient states she trusts Dr. Ammie Ferrier opinion and appreciates that she followed up for her. Patient states she will now discuss with her PCP. 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 Patient agreeable to disposition. Will close encounter.

## 2016-05-20 ENCOUNTER — Encounter: Payer: Self-pay | Admitting: Interventional Radiology

## 2016-06-04 ENCOUNTER — Ambulatory Visit (INDEPENDENT_AMBULATORY_CARE_PROVIDER_SITE_OTHER): Payer: BLUE CROSS/BLUE SHIELD | Admitting: Certified Nurse Midwife

## 2016-06-04 ENCOUNTER — Encounter: Payer: Self-pay | Admitting: Certified Nurse Midwife

## 2016-06-04 VITALS — BP 126/80 | HR 70 | Resp 16 | Ht 64.5 in | Wt 205.0 lb

## 2016-06-04 DIAGNOSIS — B9689 Other specified bacterial agents as the cause of diseases classified elsewhere: Secondary | ICD-10-CM

## 2016-06-04 DIAGNOSIS — N760 Acute vaginitis: Secondary | ICD-10-CM

## 2016-06-04 MED ORDER — METRONIDAZOLE 0.75 % VA GEL: 1.0000 | Freq: Two times a day (BID) | VAGINAL | 0 refills | Status: DC

## 2016-06-04 NOTE — Patient Instructions (Signed)
Bacterial Vaginosis Bacterial vaginosis is a vaginal infection that occurs when the normal balance of bacteria in the vagina is disrupted. It results from an overgrowth of certain bacteria. This is the most common vaginal infection among women ages 15-44. Because bacterial vaginosis increases your risk for STIs (sexually transmitted infections), getting treated can help reduce your risk for chlamydia, gonorrhea, herpes, and HIV (human immunodeficiency virus). Treatment is also important for preventing complications in pregnant women, because this condition can cause an early (premature) delivery. What are the causes? This condition is caused by an increase in harmful bacteria that are normally present in small amounts in the vagina. However, the reason that the condition develops is not fully understood. What increases the risk? The following factors may make you more likely to develop this condition:  Having a new sexual partner or multiple sexual partners.  Having unprotected sex.  Douching.  Having an intrauterine device (IUD).  Smoking.  Drug and alcohol abuse.  Taking certain antibiotic medicines.  Being pregnant.  You cannot get bacterial vaginosis from toilet seats, bedding, swimming pools, or contact with objects around you. What are the signs or symptoms? Symptoms of this condition include:  Grey or white vaginal discharge. The discharge can also be watery or foamy.  A fish-like odor with discharge, especially after sexual intercourse or during menstruation.  Itching in and around the vagina.  Burning or pain with urination.  Some women with bacterial vaginosis have no signs or symptoms. How is this diagnosed? This condition is diagnosed based on:  Your medical history.  A physical exam of the vagina.  Testing a sample of vaginal fluid under a microscope to look for a large amount of bad bacteria or abnormal cells. Your health care provider may use a cotton swab  or a small wooden spatula to collect the sample.  How is this treated? This condition is treated with antibiotics. These may be given as a pill, a vaginal cream, or a medicine that is put into the vagina (suppository). If the condition comes back after treatment, a second round of antibiotics may be needed. Follow these instructions at home: Medicines  Take over-the-counter and prescription medicines only as told by your health care provider.  Take or use your antibiotic as told by your health care provider. Do not stop taking or using the antibiotic even if you start to feel better. General instructions  If you have a female sexual partner, tell her that you have a vaginal infection. She should see her health care provider and be treated if she has symptoms. If you have a female sexual partner, he does not need treatment.  During treatment: ? Avoid sexual activity until you finish treatment. ? Do not douche. ? Avoid alcohol as directed by your health care provider. ? Avoid breastfeeding as directed by your health care provider.  Drink enough water and fluids to keep your urine clear or pale yellow.  Keep the area around your vagina and rectum clean. ? Wash the area daily with warm water. ? Wipe yourself from front to back after using the toilet.  Keep all follow-up visits as told by your health care provider. This is important. How is this prevented?  Do not douche.  Wash the outside of your vagina with warm water only.  Use protection when having sex. This includes latex condoms and dental dams.  Limit how many sexual partners you have. To help prevent bacterial vaginosis, it is best to have sex with just   one partner (monogamous).  Make sure you and your sexual partner are tested for STIs.  Wear cotton or cotton-lined underwear.  Avoid wearing tight pants and pantyhose, especially during summer.  Limit the amount of alcohol that you drink.  Do not use any products that  contain nicotine or tobacco, such as cigarettes and e-cigarettes. If you need help quitting, ask your health care provider.  Do not use illegal drugs. Where to find more information:  Centers for Disease Control and Prevention: www.cdc.gov/std  American Sexual Health Association (ASHA): www.ashastd.org  U.S. Department of Health and Human Services, Office on Women's Health: www.womenshealth.gov/ or https://www.womenshealth.gov/a-z-topics/bacterial-vaginosis Contact a health care provider if:  Your symptoms do not improve, even after treatment.  You have more discharge or pain when urinating.  You have a fever.  You have pain in your abdomen.  You have pain during sex.  You have vaginal bleeding between periods. Summary  Bacterial vaginosis is a vaginal infection that occurs when the normal balance of bacteria in the vagina is disrupted.  Because bacterial vaginosis increases your risk for STIs (sexually transmitted infections), getting treated can help reduce your risk for chlamydia, gonorrhea, herpes, and HIV (human immunodeficiency virus). Treatment is also important for preventing complications in pregnant women, because the condition can cause an early (premature) delivery.  This condition is treated with antibiotic medicines. These may be given as a pill, a vaginal cream, or a medicine that is put into the vagina (suppository). This information is not intended to replace advice given to you by your health care provider. Make sure you discuss any questions you have with your health care provider. Document Released: 05/03/2005 Document Revised: 01/17/2016 Document Reviewed: 01/17/2016 Elsevier Interactive Patient Education  2017 Elsevier Inc.  

## 2016-06-04 NOTE — Progress Notes (Signed)
51 y.o. Married Caucasian female [id number] here with complaint of vaginal symptoms of increase discharge with no odor or itching, burning,for about a week. Describes discharge as yellow slightly liquid. Started using Vagisil vaginal wash with previous Summer's Eve product also. ? Related to discharge. No STD concerns. Urinary symptoms none . Contraception is LMP 05/20/16, light but normal. Some hot flashes no issues with no insomnia. Has joined weight watchers to help with reducing need for Hypertension medication.Periods so much better as fibroids have reduced in size. No other health issues today.  ROS Pertinent to HPI, all other systems negative.  O:Healthy female WDWN Affect: normal, orientation x 3  Wet Prep: KOH, Saline positive for clue cells  Exam: Abdomen:soft, non tender  inguinal Lymph nodes: no enlargement or tenderness Pelvic exam: External genital: normal female, no scaling or lesions BUS: negative Vagina: watery yellow discharge noted. Ph:4.5   ,Wet prep taken  Cervix: normal, non tender, no CMT Uterus: normal, non tender, 12 week size, known fibroids Adnexa:normal, non tender, no masses or fullness noted  A:Normal pelvic exam BV History of Fibroids    P:Discussed findings of BV and etiology. Discussed Aveeno or baking soda sitz bath for comfort. Avoid moist clothes or pads for extended period of time. If working out in gym clothes or long periods of time change underwear  if possible. Coconut Oil use for skin protection prior to activity can be used to external skin for protection or dryness. Increase water intake also. Questions addressed. Discussed avoid changing personal products and use cream base Dove soap to avoid irritation.  Rx: Metrogel see order with instructions  Rv prn

## 2016-06-16 NOTE — Progress Notes (Signed)
Encounter reviewed Jill Jertson, MD   

## 2016-08-19 ENCOUNTER — Encounter: Payer: Self-pay | Admitting: Certified Nurse Midwife

## 2016-08-19 ENCOUNTER — Ambulatory Visit (INDEPENDENT_AMBULATORY_CARE_PROVIDER_SITE_OTHER): Payer: BLUE CROSS/BLUE SHIELD | Admitting: Certified Nurse Midwife

## 2016-08-19 VITALS — BP 108/64 | HR 64 | Resp 16 | Ht 64.5 in | Wt 199.0 lb

## 2016-08-19 DIAGNOSIS — B9689 Other specified bacterial agents as the cause of diseases classified elsewhere: Secondary | ICD-10-CM | POA: Diagnosis not present

## 2016-08-19 DIAGNOSIS — B373 Candidiasis of vulva and vagina: Secondary | ICD-10-CM

## 2016-08-19 DIAGNOSIS — B3731 Acute candidiasis of vulva and vagina: Secondary | ICD-10-CM

## 2016-08-19 DIAGNOSIS — N760 Acute vaginitis: Secondary | ICD-10-CM

## 2016-08-19 MED ORDER — METRONIDAZOLE 0.75 % VA GEL: 1.0000 | Freq: Two times a day (BID) | VAGINAL | 0 refills | Status: DC

## 2016-08-19 MED ORDER — FLUCONAZOLE 150 MG PO TABS: 150.0000 mg | ORAL_TABLET | Freq: Once | ORAL | 0 refills | Status: AC

## 2016-08-19 NOTE — Patient Instructions (Signed)

## 2016-08-19 NOTE — Progress Notes (Signed)
51 y.o. Married [place] American female [phone] here with complaint of vaginal symptoms of watery odorous discharge. Onset of symptoms 7 days ago. Denies new personal products except started Spirulina and had this to occur last time used. But is helping with regular bowel movements. No  STD concerns. Urinary symptoms none.. Contraception is none. Not sexually active. No other health issues today.  ROS pertinent to HPI  O:Healthy female WDWN Affect: normal, orientation x 3  Exam:Skin: warm and dry Abdomen:non tender, no masses, soft Inguinal Lymph nodes: no enlargement or tenderness Pelvic exam: External genital: normal female, no scaling or lesions BUS: negative Vagina:watery odorous discharge noted. Ph:4.5   ,Wet prep taken Cervix: normal, non tender, no CMT Uterus:enlarged 10-12 week size, non tender(history of fibroids) Adnexa:normal, non tender, no masses or fullness noted   Wet Prep results: KOH, Saline + yeast,+BV   A:Normal pelvic exam BV Yeast vaginitis History of fibroids    P:Discussed findings of BV/ yeast vaginitis and etiology. Discussed Aveeno  sitz bath for comfort. Avoid moist clothes or pads for extended period of time. If working out in gym clothes  for long periods of time change underwear or bottoms  if possible. Coconut Oil use for skin protection prior to activity can used to external skin for protection or dryness. Questions addressed. Aware of fibroids no change in size per OV note. Rx: Diflucan see order with instructions Rx Metrogel with instructions  Rv prn

## 2016-08-25 NOTE — Progress Notes (Signed)
Encounter reviewed Christianjames Soule, MD   

## 2016-09-07 ENCOUNTER — Telehealth: Payer: Self-pay | Admitting: Certified Nurse Midwife

## 2016-09-07 NOTE — Telephone Encounter (Signed)
agree

## 2016-09-07 NOTE — Telephone Encounter (Signed)
Spoke with patient. Patient states she completed metrogel and second diflucan around 4/15 and menses started right after. Patient states cycle has finished and still experiencing watery discharge with no odor. Advised patient can take up to 2 weeks for medication to work and for vagina to re-balance. Recommended aveeno/baking soda sitz bath, patient states she has not tried this yet. Advised patient if symptoms persist, return call to office. Advised patient would review with Melvia Heaps, CNM and return call with any additional recommendations. Patient verbalizes understanding and is agreeable.  Melvia Heaps, CNM -any additional recommendations?

## 2016-09-07 NOTE — Telephone Encounter (Signed)
Patient is still having issues and was told to call.

## 2016-09-07 NOTE — Telephone Encounter (Signed)
Left message to call Daunte Oestreich at [phone].  

## 2016-10-08 ENCOUNTER — Encounter: Payer: Self-pay | Admitting: Certified Nurse Midwife

## 2016-10-08 ENCOUNTER — Ambulatory Visit (INDEPENDENT_AMBULATORY_CARE_PROVIDER_SITE_OTHER): Payer: BLUE CROSS/BLUE SHIELD | Admitting: Certified Nurse Midwife

## 2016-10-08 ENCOUNTER — Telehealth: Payer: Self-pay | Admitting: Certified Nurse Midwife

## 2016-10-08 VITALS — BP 106/62 | HR 70 | Resp 16 | Ht 64.5 in | Wt 200.0 lb

## 2016-10-08 DIAGNOSIS — N852 Hypertrophy of uterus: Secondary | ICD-10-CM | POA: Diagnosis not present

## 2016-10-08 DIAGNOSIS — Z86018 Personal history of other benign neoplasm: Secondary | ICD-10-CM

## 2016-10-08 DIAGNOSIS — N898 Other specified noninflammatory disorders of vagina: Secondary | ICD-10-CM | POA: Diagnosis not present

## 2016-10-08 NOTE — Progress Notes (Signed)
51 y.o. Married [place] American female [phone] here with complaint of vaginal symptoms of increase watery discharge. Denies vaginal itching or burning. Has had to wear panti- liner with drainage. Onset of symptoms 7 days ago after period. Period normal but less bleeding.. Denies new personal products or vaginal dryness. Urinary symptoms none . Contraception is none. History of fibroid embolization 5/17 had same type of discharge after embolization and was told that this could occur when the fibroid weeps. Patient denies fever, chills, cramping or abdominal pain. Feel normal, just having this watery discharge. No other health issues today..  ROS pertinent to HPI  O:Healthy female WDWN Affect: normal, orientation x 3  Exam: Skin: warm and dry Abdomen: soft, non tender  Inguinal Lymph nodes: no enlargement or tenderness Pelvic exam: External genital: normal female BUS: negative Vagina: clear to slight hint of color discharge noted. Normal appearance Ph:4.5   ,Wet prep taken, Cervix: normal, non tender, no CMT, discharge seen coming from cervix with clear very slight hint of color, no odor, no particles or mucous Uterus: enlarged @ umbilicus 08-81 week size, nodular feel, history of fibroids Adnexa: non tender, no masses or fullness noted, limited palpation of adnexae due to fibroids   Wet Prep results:KOH/Saline negative for pathogens   A:Normal pelvic exam Enlarged uterus with history of fibroids and embolization 5/17 Cervical discharge small to moderate clear/slight color amount ? From fibroid necrosis Wet prep negative for pathogens   P:Discussed findings of fluid noted and? Etiology, Discussed possible relationship with embolization of fibroid..  Discussed need for PUS and possible endometrial biopsy after consultation with Dr. Quincy Simmonds. Will schedule prior to patient leaving today. Questions addressed with patient. Patient agreeable. Should advise if abdominal pain or cramping occurs or  change in fluid color or appearance.  Rv prn as above

## 2016-10-08 NOTE — Patient Instructions (Signed)
Uterine Artery Embolization for Fibroids, Care After  Refer to this sheet in the next few weeks. These instructions provide you with information on caring for yourself after your procedure. Your health care provider may also give you more specific instructions. Your treatment has been planned according to current medical practices, but problems sometimes occur. Call your health care provider if you have any problems or questions after your procedure.  What can I expect after the procedure?  After your procedure, it is typical to have cramping in the pelvis. You will be given pain medicine to control it.  Follow these instructions at home:  · Only take over-the-counter or prescription medicines for pain, discomfort, or fever as directed by your health care provider.  · Do not take aspirin. It can cause bleeding.  · Follow your health care provider’s advice regarding medicines given to you, diet, activity, and when to begin sexual activity.  · See your health care provider for follow-up care as directed.  Contact a health care provider if:  · You have a fever.  · You have redness, swelling, and pain around your incision site.  · You have pus draining from your incision.  · You have a rash.  Get help right away if:  · You have bleeding from your incision site.  · You have difficulty breathing.  · You have chest pain.  · You have severe abdominal pain.  · You have leg pain.  · You become dizzy and faint.  This information is not intended to replace advice given to you by your health care provider. Make sure you discuss any questions you have with your health care provider.  Document Released: 02/21/2013 Document Revised: 10/09/2015 Document Reviewed: 11/16/2012  Elsevier Interactive Patient Education © 2017 Elsevier Inc.

## 2016-10-12 ENCOUNTER — Ambulatory Visit (INDEPENDENT_AMBULATORY_CARE_PROVIDER_SITE_OTHER): Payer: BLUE CROSS/BLUE SHIELD | Admitting: Obstetrics & Gynecology

## 2016-10-12 ENCOUNTER — Other Ambulatory Visit: Payer: Self-pay | Admitting: *Deleted

## 2016-10-12 ENCOUNTER — Ambulatory Visit (INDEPENDENT_AMBULATORY_CARE_PROVIDER_SITE_OTHER): Payer: BLUE CROSS/BLUE SHIELD

## 2016-10-12 ENCOUNTER — Other Ambulatory Visit: Payer: Self-pay | Admitting: Obstetrics & Gynecology

## 2016-10-12 VITALS — BP 138/78 | HR 110 | Resp 18 | Ht 64.5 in | Wt 200.0 lb

## 2016-10-12 DIAGNOSIS — Z86018 Personal history of other benign neoplasm: Secondary | ICD-10-CM | POA: Diagnosis not present

## 2016-10-12 DIAGNOSIS — R102 Pelvic and perineal pain: Secondary | ICD-10-CM | POA: Diagnosis not present

## 2016-10-12 DIAGNOSIS — D259 Leiomyoma of uterus, unspecified: Secondary | ICD-10-CM | POA: Diagnosis not present

## 2016-10-12 DIAGNOSIS — N859 Noninflammatory disorder of uterus, unspecified: Secondary | ICD-10-CM

## 2016-10-12 LAB — POCT URINALYSIS DIPSTICK
Bilirubin, UA: NEGATIVE
GLUCOSE UA: NEGATIVE
Ketones, UA: NEGATIVE
Leukocytes, UA: NEGATIVE
NITRITE UA: NEGATIVE
Protein, UA: NEGATIVE
RBC UA: NEGATIVE
pH, UA: 5 (ref 5.0–8.0)

## 2016-10-12 LAB — POCT URINE PREGNANCY: PREG TEST UR: NEGATIVE

## 2016-10-12 NOTE — Progress Notes (Signed)
51 y.o. G52P0030 Married [place] American female here for pelvic ultrasound due to enlarged uterus with fibroids, h/o [place] 09/29/15 and recent clear vaginal discharge.    Patient's last menstrual period was 09/30/2016 (exact date).  Contraception: none.  UPT negative today.  Findings:  UTERUS: enlarged uterus with multiple fibroids.  Uterus measures 12.8 x 9.1 x 9.2cm.  At least 13 fibroids noted today with largest measuring 6.1 x 4.8cm ZES:[id number] amount of fluid noted.  Cavity is distorted due to fibroids ADNEXA: Left ovary: surgically absent       Right ovary: 4.1 x 3.2 x 3.1cm  CUL DE SAC: no free fluid  Discussion:  Findings reviewed with pt.  Feel endometrial biopsy is appropriate given presence of fluid.  Pt is in agreement.  This was done under ultrasound guidance to ensure that I was at the area of the fluid collection.  Endometrial biopsy recommended.  Discussed with patient.  Verbal and written consent obtained.   Procedure:  Speculum placed.  Cervix visualized and cleansed with betadine prep.  A single toothed tenaculum was applied to the anterior lip of the cervix.  Endometrial pipelle was advanced through the cervix into the endometrial cavity without difficulty.  Pipelle passed to 8cm.  Suction applied and pipelle removed with good tissue sample obtained.  Fluid collection collapsed with biopsy.  Tenculum removed.  No bleeding noted.  Patient tolerated procedure well.  Assessment:  Enlarged fibroid uterus Clear vaginal discharge Endometrial fluid collection  Plan:  Biopsy obtained.  Results will be called to.  Hopefully will not need to proceed with anything surgically.  Pt in agreement with plan.  ~15 minutes spent with patient >50% of time was in face to face discussion of above.

## 2016-10-12 NOTE — Telephone Encounter (Signed)
error 

## 2016-10-14 ENCOUNTER — Encounter: Payer: Self-pay | Admitting: Obstetrics & Gynecology

## 2016-10-15 ENCOUNTER — Telehealth: Payer: Self-pay | Admitting: *Deleted

## 2016-10-15 NOTE — Telephone Encounter (Signed)
-----   Message from Megan Salon, MD sent at 10/15/2016  9:17 AM EDT ----- Please let pt know endometrial biopsy showed benign endometrial tissue.  No abnormal cells noted.  I'd like to know if she continues to have the watery discharge in the future.  Thanks.

## 2016-10-15 NOTE — Telephone Encounter (Signed)
Spoke with patient, advised of results as seen below per Dr. Sabra Heck. Patient states she continues to have the clear vaginal discharge as mentioned. Reports is intermittent, but still continues. Advised patient would update Dr. Sabra Heck and return call with recommendations, patient is agreeable and verbalizes understanding.  Dr. Sabra Heck, please advise?  Cc: Melvia Heaps, CNM

## 2016-10-19 NOTE — Telephone Encounter (Signed)
I think that this may be from her uterine artery embolization.  Pap smear and biopsy is negative.  She has known fibroids and we are trying not to do any aggressive surgery like hysterectomy.  I do not think any additional testing is needed.  She needs to let us know if this changes.  Thanks.

## 2016-10-19 NOTE — Telephone Encounter (Signed)
Spoke with patient, advised as seen below per Dr. Sabra Heck. Patient states the discharge has stopped for the time being, will call in future if any questions/concerns. Patient verbalizes understanding and is agreeable.  Routing to provider for final review. Patient is agreeable to disposition. Will close encounter.

## 2017-01-24 ENCOUNTER — Encounter: Payer: Self-pay | Admitting: Obstetrics & Gynecology

## 2017-01-24 ENCOUNTER — Ambulatory Visit (INDEPENDENT_AMBULATORY_CARE_PROVIDER_SITE_OTHER): Payer: BLUE CROSS/BLUE SHIELD | Admitting: Obstetrics & Gynecology

## 2017-01-24 VITALS — BP 126/60 | HR 80 | Resp 16 | Ht 64.0 in | Wt 204.5 lb

## 2017-01-24 DIAGNOSIS — Z01411 Encounter for gynecological examination (general) (routine) with abnormal findings: Secondary | ICD-10-CM

## 2017-01-24 NOTE — Progress Notes (Addendum)
51 y.o. G25P0030 Married [place] F here for annual exam.  Pt reports she still has a Benavidez bit of fluid after her cycle but not as much as earlier this year.  Has multiple fibroids.  Cycles are still regular and last about 4 days.  Flow is much better since the uterine artery embolization.  PCP:  Dr. Jonelle Sidle.  Has not have routine evaluation this year.  Patient's last menstrual period was 01/23/2017.          Sexually active: Yes.    The current method of family planning is none.    Exercising: Yes.    walking Smoker:  no  Health Maintenance: Pap:  01/23/16 negative, HR HPV negative, 10/28/14 negative, 12/04/12 negative, HR HPV negative  History of abnormal Pap:  yes MMG:  07/05/14 BIRADS 1 negative    Colonoscopy:  never BMD:   never TDaP:  11/02/12  Pneumonia vaccine(s):  never Zostavax:   never Hep C testing: not indicated  Screening Labs: discuss with provider, Hb today: same   reports that she has never smoked. She has never used smokeless tobacco. She reports that she drinks alcohol. She reports that she does not use drugs.  Past Medical History:  Diagnosis Date  . Abnormal Pap smear 7/13   ASCUS/positive HR HPV, negative colpo  . Anemia 2006  . 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 Fibroids    with enlarged uterus  . Hypertension   . Pulmonary embolism ([place]) 2008   secondary to [facility]    Past Surgical History:  Procedure Laterality Date  . IR GENERIC HISTORICAL  03/31/2016   IR RADIOLOGIST EVAL & MGMT 03/31/2016 Marybelle Killings, MD [facility] INTERV RAD  . IR GENERIC HISTORICAL  01/06/2016   IR RADIOLOGIST EVAL & MGMT 01/06/2016 Marybelle Killings, MD [facility] INTERV RAD  . left finger reattachment  age 49  . LEFT OOPHORECTOMY Left 1998      . MYOMECTOMY  2003  . MYOMECTOMY  2008   via laparoscopy    Current Outpatient Prescriptions  Medication Sig Dispense Refill  . amLODipine-valsartan (EXFORGE) 10-320 MG tablet     . Biotin w/ Vitamins C & E (HAIR/SKIN/NAILS PO) Take by  mouth.    . Cholecalciferol (VITAMIN D3) 2000 units capsule     . ferrous sulfate 325 (65 FE) MG tablet     . FIBER ADULT GUMMIES PO Take by mouth.    Marland Kitchen FINACEA 15 % FOAM     . hydrochlorothiazide (HYDRODIURIL) 25 MG tablet     . ketoconazole (NIZORAL) 2 % cream     . Multiple Vitamin (MULTIVITAMIN) capsule Take 1 capsule by mouth.     . SPIRULINA PO Take by mouth daily.    Marland Kitchen TAZORAC 0.05 % cream      No current facility-administered medications for this visit.     Family History  Problem Relation Age of Onset  . Diabetes Mother   . Hypertension Mother   . Heart disease Mother        cardiomegaly  . Lupus Mother   . Diabetes Father   . Hypertension Father   . Stroke Father   . Hypertension Maternal Grandmother   . Stroke Maternal Grandmother   . Hypertension Sister   . Seizures Sister   . COPD Sister     ROS:  Pertinent items are noted in HPI.  Otherwise, a comprehensive ROS was negative.  Exam:   BP 126/60 (BP Location: Right Arm, Patient Position: Sitting,  Cuff Size: Normal)   Pulse 80   Resp 16   Ht 5\' 4"  (1.626 m)   Wt 204 lb 8 oz (92.8 kg)   LMP 01/23/2017   BMI 35.10 kg/m   Weight change: stable   Height: 5\' 4"  (162.6 cm)  Ht Readings from Last 3 Encounters:  01/24/17 5\' 4"  (1.626 m)  10/12/16 5' 4.5" (1.638 m)  10/08/16 5' 4.5" (1.638 m)    General appearance: alert, cooperative and appears stated age Head: Normocephalic, without obvious abnormality, atraumatic Neck: no adenopathy, supple, symmetrical, trachea midline and thyroid normal to inspection and palpation Lungs: clear to auscultation bilaterally Breasts: normal appearance, no masses or tenderness Heart: regular rate and rhythm Abdomen: soft, non-tender; bowel sounds normal; no masses,  no organomegaly Extremities: extremities normal, atraumatic, no cyanosis or edema Skin: Skin color, texture, turgor normal. No rashes or lesions Lymph nodes: Cervical, supraclavicular, and axillary nodes  normal. No abnormal inguinal nodes palpated Neurologic: Grossly normal   Pelvic: External genitalia:  no lesions              Urethra:  normal appearing urethra with no masses, tenderness or lesions              Bartholins and Skenes: normal                 Vagina: normal appearing vagina with normal color and discharge, no lesions              Cervix: no lesions              Pap taken: No. Bimanual Exam:  Uterus:  enlarged, 18 weeks size, firm              Adnexa: normal adnexa and no mass, fullness, tenderness               Rectovaginal: Confirms               Anus:  normal sphincter tone, no lesions  Chaperone was present for exam.  A:  Well Woman with normal exam H/O uterine fibroids s/p [place] 5/17, myomectomy x2 in 2003 and 2008 Hypertension H/O condyloma H/O anemia with low ferritin H/O PE H/o ASCUS pap with +HR HPV 2013.  Neg pap and neg HR HPV 2015 and 2017  P:   Mammogram guidelines reviewed.  Pt aware she needs to schedule pap smear up to date and not obtained today Reviewed new colon cancer screening guidelines.  Will plan to refer next year due to insurance coverage. Release of records for lab work signed today May need to consider iron infusion. Return annually or prn  Dr. Leontine Locket lab work came 02/07/17.  Hb 9.7.  Total iron 29.  9% saturation.  Ferritin 8.  These were drawn 11/11/16.  Daily iron and colace recommended.

## 2017-01-28 ENCOUNTER — Ambulatory Visit: Payer: BLUE CROSS/BLUE SHIELD | Admitting: Certified Nurse Midwife

## 2017-02-08 ENCOUNTER — Other Ambulatory Visit: Payer: Self-pay | Admitting: Obstetrics & Gynecology

## 2017-02-08 ENCOUNTER — Telehealth: Payer: Self-pay | Admitting: *Deleted

## 2017-02-08 DIAGNOSIS — D500 Iron deficiency anemia secondary to blood loss (chronic): Secondary | ICD-10-CM

## 2017-02-08 NOTE — Telephone Encounter (Signed)
I think should have repeat CBC and iron studies.  If iron levels are still low, will refer for iron transfusion.  Order placed for lab work.  Thanks.

## 2017-02-08 NOTE — Telephone Encounter (Signed)
Spoke with patient, advised as seen below per Dr. Sabra Heck. Patient states she has been taking iron daily, no follow-up lab work has been done. Advised patient would update Dr. Sabra Heck and return call with recommendations, patient is agreeable.   Dr. Sabra Heck -please advise on f/u?

## 2017-02-08 NOTE — Telephone Encounter (Signed)
-----   Message from Megan Salon, MD sent at 02/07/2017 12:45 PM EDT ----- Regarding: possible iron infusion Could you please let pt know that I got the records from Dr. Jonelle Sidle.  Her iron levels are really low.  He recommended daily iron but I do not know about planned follow-up.  I do think some iron infusions would help but can you see if she's had any follow-up lab work done.  We discussed the possibility of iron infusions when she was here for her AEX.  Thanks.  [name]

## 2017-02-09 NOTE — Telephone Encounter (Signed)
Left message to call Jalana Moore at [phone].  

## 2017-02-14 NOTE — Telephone Encounter (Signed)
Patient returning Jill's call.  °

## 2017-02-14 NOTE — Telephone Encounter (Signed)
Spoke with patient. Scheduled for lab appointment on 10/2 at 9am. Patient states if iron transfusion is recommended, would like recommendations from Dr. Sabra Heck for referral. Patient verbalizes understanding and is agreeable.  Routing to provider for final review. Patient is agreeable to disposition. Will close encounter.

## 2017-02-15 ENCOUNTER — Other Ambulatory Visit: Payer: BLUE CROSS/BLUE SHIELD

## 2017-02-15 DIAGNOSIS — D500 Iron deficiency anemia secondary to blood loss (chronic): Secondary | ICD-10-CM

## 2017-02-16 LAB — CBC
Hematocrit: 34.6 % (ref 34.0–46.6)
Hemoglobin: 11.1 g/dL (ref 11.1–15.9)
MCH: 27.1 pg (ref 26.6–33.0)
MCHC: 32.1 g/dL (ref 31.5–35.7)
MCV: 85 fL (ref 79–97)
PLATELETS: 297 10*3/uL (ref 150–379)
RBC: 4.09 x10E6/uL (ref 3.77–5.28)
RDW: 15.1 % (ref 12.3–15.4)
WBC: 7.1 10*3/uL (ref 3.4–10.8)

## 2017-02-16 LAB — IRON,TIBC AND FERRITIN PANEL
Ferritin: 26 ng/mL (ref 15–150)
IRON SATURATION: 16 % (ref 15–55)
Iron: 42 ug/dL (ref 27–159)
Total Iron Binding Capacity: 270 ug/dL (ref 250–450)
UIBC: 228 ug/dL (ref 131–425)

## 2017-03-10 ENCOUNTER — Telehealth: Payer: Self-pay | Admitting: *Deleted

## 2017-03-10 NOTE — Telephone Encounter (Signed)
Return call to Reina °

## 2017-03-10 NOTE — Telephone Encounter (Signed)
-----   Message from Megan Salon, MD sent at 03/09/2017  4:08 PM EDT ----- Please call pt and let her know it's taken me several days to touch base with Dr. Marin Olp.  He has reviewed her lab work and thinks she would benefit from an iron transfusion.  Is she ok if I refer her for this?

## 2017-03-10 NOTE — Telephone Encounter (Signed)
Called patient. States she is ok to do the iron transfusion.

## 2017-03-10 NOTE — Telephone Encounter (Signed)
LM for pt to call back.  Please route to triage if I am not available.

## 2017-03-13 ENCOUNTER — Other Ambulatory Visit: Payer: Self-pay | Admitting: Obstetrics & Gynecology

## 2017-03-13 DIAGNOSIS — D500 Iron deficiency anemia secondary to blood loss (chronic): Secondary | ICD-10-CM

## 2017-03-13 NOTE — Telephone Encounter (Signed)
Referral to Dr. Marin Olp has been placed.  She will see him for iron transfusion.  Ok to close encounter.

## 2017-04-12 ENCOUNTER — Ambulatory Visit (HOSPITAL_BASED_OUTPATIENT_CLINIC_OR_DEPARTMENT_OTHER): Payer: BLUE CROSS/BLUE SHIELD | Admitting: Family

## 2017-04-12 ENCOUNTER — Other Ambulatory Visit (HOSPITAL_BASED_OUTPATIENT_CLINIC_OR_DEPARTMENT_OTHER): Payer: BLUE CROSS/BLUE SHIELD

## 2017-04-12 ENCOUNTER — Other Ambulatory Visit: Payer: Self-pay | Admitting: Family

## 2017-04-12 ENCOUNTER — Other Ambulatory Visit: Payer: Self-pay

## 2017-04-12 ENCOUNTER — Encounter: Payer: Self-pay | Admitting: Family

## 2017-04-12 VITALS — BP 147/77 | HR 60 | Temp 98.4°F | Resp 16 | Wt 204.0 lb

## 2017-04-12 DIAGNOSIS — N920 Excessive and frequent menstruation with regular cycle: Secondary | ICD-10-CM | POA: Diagnosis not present

## 2017-04-12 DIAGNOSIS — D649 Anemia, unspecified: Secondary | ICD-10-CM

## 2017-04-12 DIAGNOSIS — D500 Iron deficiency anemia secondary to blood loss (chronic): Secondary | ICD-10-CM

## 2017-04-12 LAB — CMP (CANCER CENTER ONLY)
ALK PHOS: 74 U/L (ref 26–84)
ALT: 26 U/L (ref 10–47)
AST: 25 U/L (ref 11–38)
Albumin: 3.7 g/dL (ref 3.3–5.5)
BUN: 9 mg/dL (ref 7–22)
CO2: 26 mEq/L (ref 18–33)
CREATININE: 0.8 mg/dL (ref 0.6–1.2)
Calcium: 9.2 mg/dL (ref 8.0–10.3)
Chloride: 104 mEq/L (ref 98–108)
Glucose, Bld: 101 mg/dL (ref 73–118)
Potassium: 3.8 mEq/L (ref 3.3–4.7)
SODIUM: 142 meq/L (ref 128–145)
TOTAL PROTEIN: 8.5 g/dL — AB (ref 6.4–8.1)
Total Bilirubin: 0.6 mg/dl (ref 0.20–1.60)

## 2017-04-12 LAB — CBC WITH DIFFERENTIAL (CANCER CENTER ONLY)
BASO#: 0 10*3/uL (ref 0.0–0.2)
BASO%: 0.5 % (ref 0.0–2.0)
EOS%: 1.8 % (ref 0.0–7.0)
Eosinophils Absolute: 0.1 10*3/uL (ref 0.0–0.5)
HCT: 35.3 % (ref 34.8–46.6)
HGB: 11.5 g/dL — ABNORMAL LOW (ref 11.6–15.9)
LYMPH#: 1.6 10*3/uL (ref 0.9–3.3)
LYMPH%: 26.2 % (ref 14.0–48.0)
MCH: 28.5 pg (ref 26.0–34.0)
MCHC: 32.6 g/dL (ref 32.0–36.0)
MCV: 88 fL (ref 81–101)
MONO#: 0.5 10*3/uL (ref 0.1–0.9)
MONO%: 8.3 % (ref 0.0–13.0)
NEUT#: 3.8 10*3/uL (ref 1.5–6.5)
NEUT%: 63.2 % (ref 39.6–80.0)
PLATELETS: 285 10*3/uL (ref 145–400)
RBC: 4.03 10*6/uL (ref 3.70–5.32)
RDW: 13.8 % (ref 11.1–15.7)
WBC: 6 10*3/uL (ref 3.9–10.0)

## 2017-04-12 LAB — RETICULOCYTES: Reticulocyte Count: 2.5 % (ref 0.6–2.6)

## 2017-04-12 LAB — IRON AND TIBC
%SAT: 19 % — ABNORMAL LOW (ref 21–57)
Iron: 54 ug/dL (ref 41–142)
TIBC: 281 ug/dL (ref 236–444)
UIBC: 228 ug/dL (ref 120–384)

## 2017-04-12 LAB — FERRITIN: Ferritin: 21 ng/ml (ref 9–269)

## 2017-04-12 LAB — CHCC SATELLITE - SMEAR

## 2017-04-12 LAB — TECHNOLOGIST REVIEW CHCC SATELLITE

## 2017-04-12 LAB — LACTATE DEHYDROGENASE: LDH: 231 U/L (ref 125–245)

## 2017-04-12 NOTE — Progress Notes (Signed)
Hematology/Oncology Consultation   Name: Delbra Zellars      MRN: [id number]    Location: Room/bed info not found  Date: 04/12/2017 Time:9:40 PM   REFERRING PHYSICIAN: Megan Salon, MD  REASON FOR CONSULT: Iron deficiency anemia due to chronic blood loss    DIAGNOSIS:  Iron deficiency anemia secondary to the chronic blood loss with heavy cycle  HISTORY OF PRESENT ILLNESS: Ms. Raspberry is a very pleasant 51 yo [place] American female with history of iron deficiency anemia due to heavy cycles.  She has history of uterine fibroids with recent uterine artery embolization. She is not on birth control at this time.  Her cycle is regular lasting 4-5 days.  She has had no other bleeding, no bruising or petechiae.  She is taking an oral iron supplement daily. Iron studies for today are pending.  She is symptomatic with fatigue, chewing ice a [place], palpitations, chills and brain fog.  She states that her mother and father were also anemia.  No personal or familial cancer history that she knows of.  Her father had MGUS.  Both her mother and a maternal second cousin have Lupus.  She has had surgery in the past including wisdom teeth extraction with no problem.  No history of miscarriage, 1 abortion.  She is due for her mammogram and states that she will schedule this herself.  No fever, chills, n/v, cough, rash, dizziness, SOB, chest pain, abdominal pain or change sin bowel or bladder habits.  The puffiness in her feet and ankles comes and goes. This is unchanged. No tenderness, numbness or tingling in her extremities. No c/o pain at this time.  She has maintained a good appetite and is staying well hydrated. Her weight is stable.  She does not smoke and only occasionally has a glass of wine socially.  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   ROS: All other 10 point review of systems is negative.   PAST MEDICAL HISTORY:   Past Medical History:  Diagnosis Date  . Abnormal  Pap smear 7/13   ASCUS/positive HR HPV, negative colpo  . Anemia 2006  . 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 Fibroids    with enlarged uterus  . Hypertension   . Pulmonary embolism ([place]) 2008   secondary to [facility]    ALLERGIES: Allergies  Allergen Reactions  . Minocycline Hcl Hives and Itching  . Sulfa Antibiotics Swelling    Swell of the lips       MEDICATIONS:  Current Outpatient Medications on File Prior to Visit  Medication Sig Dispense Refill  . amLODipine-valsartan (EXFORGE) 10-320 MG tablet     . Biotin w/ Vitamins C & E (HAIR/SKIN/NAILS PO) Take by mouth.    . Cholecalciferol (VITAMIN D3) 2000 units capsule     . ferrous sulfate 325 (65 FE) MG tablet     . FIBER ADULT GUMMIES PO Take by mouth.    Marland Kitchen FINACEA 15 % FOAM     . hydrochlorothiazide (HYDRODIURIL) 25 MG tablet     . ketoconazole (NIZORAL) 2 % cream     . Multiple Vitamin (MULTIVITAMIN) capsule Take 1 capsule by mouth.     . SPIRULINA PO Take by mouth daily.    Marland Kitchen TAZORAC 0.05 % cream      No current facility-administered medications on file prior to visit.      PAST SURGICAL HISTORY Past Surgical History:  Procedure Laterality Date  . IR GENERIC HISTORICAL  03/31/2016   IR RADIOLOGIST EVAL & MGMT 03/31/2016 Marybelle Killings, MD [facility] INTERV RAD  . IR GENERIC HISTORICAL  01/06/2016   IR RADIOLOGIST EVAL & MGMT 01/06/2016 Marybelle Killings, MD [facility] INTERV RAD  . left finger reattachment  age 67  . LEFT OOPHORECTOMY Left 1998      . MYOMECTOMY  2003  . MYOMECTOMY  2008   via laparoscopy    FAMILY HISTORY: Family History  Problem Relation Age of Onset  . Diabetes Mother   . Hypertension Mother   . Heart disease Mother        cardiomegaly  . Lupus Mother   . Diabetes Father   . Hypertension Father   . Stroke Father   . Hypertension Maternal Grandmother   . Stroke Maternal Grandmother   . Hypertension Sister   . Seizures Sister   . COPD Sister     SOCIAL HISTORY:  reports that  has never smoked. she  has never used smokeless tobacco. She reports that she drinks alcohol. She reports that she does not use drugs.  PERFORMANCE STATUS: The patient's performance status is 1 - Symptomatic but completely ambulatory  PHYSICAL EXAM: Most Recent Vital Signs: Blood pressure (!) 147/77, pulse 60, temperature 98.4 F (36.9 C), temperature source Oral, resp. rate 16, weight 204 lb (92.5 kg), SpO2 100 %. BP (!) 147/77 (BP Location: Left Arm, Patient Position: Sitting)   Pulse 60   Temp 98.4 F (36.9 C) (Oral)   Resp 16   Wt 204 lb (92.5 kg)   SpO2 100%   BMI 35.02 kg/m   General Appearance:    Alert, cooperative, no distress, appears stated age  Head:    Normocephalic, without obvious abnormality, atraumatic  Eyes:    PERRL, conjunctiva/corneas clear, EOM's intact, fundi    benign, both eyes        Throat:   Lips, mucosa, and tongue normal; teeth and gums normal  Neck:   Supple, symmetrical, trachea midline, no adenopathy;    thyroid:  no enlargement/tenderness/nodules; no carotid   bruit or JVD  Back:     Symmetric, no curvature, ROM normal, no CVA tenderness  Lungs:     Clear to auscultation bilaterally, respirations unlabored  Chest Wall:    No tenderness or deformity   Heart:    Regular rate and rhythm, S1 and S2 normal, no murmur, rub   or gallop     Abdomen:     Soft, non-tender, bowel sounds active all four quadrants,    no masses, no organomegaly        Extremities:   Extremities normal, atraumatic, no cyanosis or edema  Pulses:   2+ and symmetric all extremities  Skin:   Skin color, texture, turgor normal, no rashes or lesions  Lymph nodes:   Cervical, supraclavicular, and axillary nodes normal  Neurologic:   CNII-XII intact, normal strength, sensation and reflexes    throughout    LABORATORY DATA:  Results for orders placed or performed in visit on 04/12/17 (from the past 48 hour(s))  CBC w/Diff     Status: Abnormal   Collection Time: 04/12/17  8:58 AM  Result Value Ref  Range   WBC 6.0 3.9 - 10.0 10e3/uL   RBC 4.03 3.70 - 5.32 10e6/uL   HGB 11.5 (L) 11.6 - 15.9 g/dL   HCT 35.3 34.8 - 46.6 %   MCV 88 81 - 101 fL   MCH 28.5 26.0 - 34.0 pg   MCHC 32.6 32.0 -  36.0 g/dL   RDW 13.8 11.1 - 15.7 %   Platelets 285 145 - 400 10e3/uL   NEUT# 3.8 1.5 - 6.5 10e3/uL   LYMPH# 1.6 0.9 - 3.3 10e3/uL   MONO# 0.5 0.1 - 0.9 10e3/uL   Eosinophils Absolute 0.1 0.0 - 0.5 10e3/uL   BASO# 0.0 0.0 - 0.2 10e3/uL   NEUT% 63.2 39.6 - 80.0 %   LYMPH% 26.2 14.0 - 48.0 %   MONO% 8.3 0.0 - 13.0 %   EOS% 1.8 0.0 - 7.0 %   BASO% 0.5 0.0 - 2.0 %  CMP STAT     Status: Abnormal   Collection Time: 04/12/17  8:58 AM  Result Value Ref Range   Sodium 142 128 - 145 mEq/L   Potassium 3.8 3.3 - 4.7 mEq/L   Chloride 104 98 - 108 mEq/L   CO2 26 18 - 33 mEq/L   Glucose, Bld 101 73 - 118 mg/dL   BUN, Bld 9 7 - 22 mg/dL   Creat 0.8 0.6 - 1.2 mg/dl   Total Bilirubin 0.60 0.20 - 1.60 mg/dl   Alkaline Phosphatase 74 26 - 84 U/L   AST 25 11 - 38 U/L   ALT(SGPT) 26 10 - 47 U/L   Total Protein 8.5 (H) 6.4 - 8.1 g/dL   Albumin 3.7 3.3 - 5.5 g/dL   Calcium 9.2 8.0 - 10.3 mg/dL  LDH     Status: None   Collection Time: 04/12/17  8:58 AM  Result Value Ref Range   LDH 231 125 - 245 U/L  Ferritin     Status: None   Collection Time: 04/12/17  8:58 AM  Result Value Ref Range   Ferritin 21 9 - 269 ng/ml  Iron and TIBC     Status: Abnormal   Collection Time: 04/12/17  8:58 AM  Result Value Ref Range   Iron 54 41 - 142 ug/dL   TIBC 281 236 - 444 ug/dL   UIBC 228 120 - 384 ug/dL   %SAT 19 (L) 21 - 57 %  Smear     Status: None   Collection Time: 04/12/17  8:58 AM  Result Value Ref Range   Smear Result Smear Available   TECHNOLOGIST REVIEW [facility]     Status: None   Collection Time: 04/12/17  8:58 AM  Result Value Ref Range   Tech Review Large platelets       RADIOGRAPHY: No results found.     PATHOLOGY: None   ASSESSMENT/PLAN: Ms. Enloe is a very pleasant 51 yo [place]  American female with history of iron deficiency anemia due to heavy cycles. She is symtomatic with fatigue, palpitations, chewing ice, chills and brain fog.  She is currently taking an oral iron supplement. We will see what her iron studies show and determine if she will need IV iron.   All questions were answered and she is in agreement with the plan. She will contact our office with any questions or concerns. We can certainly see her sooner if need be.  She was discussed with and also seen by Dr. Marin Olp and he is in agreement with the aforementioned.   [facility]     Addendum:   I saw and examined the patient with Mckinsley Koelzer.  I agree with her above assessment.  She clearly is iron deficient.  I looked at her blood smear.  She had significant anisocytosis and poikilocytosis.  She had microcytic red blood cells.  She had iron studies done.  Her ferritin was 21 with an iron saturation of 19%.  I think she probably would benefit from IV iron.  We will see about getting this set up for her.  I will plan to get her back in another 4-6 weeks.  I want to try to make sure that she feels well for the Christmas holiday.  We spent about 40 minutes with her.  We answered all of her questions.  We went over her lab work and reviewed it with her.  She felt much more reassured after seeing [place].  Lattie Haw, MD

## 2017-04-13 LAB — ERYTHROPOIETIN: ERYTHROPOIETIN: 27.3 m[IU]/mL — AB (ref 2.6–18.5)

## 2017-04-20 ENCOUNTER — Telehealth: Payer: Self-pay | Admitting: *Deleted

## 2017-04-20 NOTE — Telephone Encounter (Addendum)
Patient is aware of results and instruction for oral supplement. Transferred to scheduling to make appointment  ----- Message from Eliezer Bottom, NP sent at 04/19/2017  5:27 PM EST ----- Regarding: Iron  Iron is slightly better on oral supplement. We will see her back again in 6 weeks for follow-up and lab to re-evaluate. Continue iron supplement daily! LOS sent to [facility]. Thank you!  Sarah  ----- Message ----- From: Interface, Lab In Three Zero One Sent: 04/12/2017   9:19 AM To: Eliezer Bottom, NP

## 2017-06-02 ENCOUNTER — Inpatient Hospital Stay: Payer: BLUE CROSS/BLUE SHIELD | Attending: Hematology & Oncology | Admitting: Hematology & Oncology

## 2017-06-02 ENCOUNTER — Inpatient Hospital Stay: Payer: BLUE CROSS/BLUE SHIELD

## 2017-06-02 VITALS — BP 134/79 | HR 74 | Temp 98.7°F | Resp 18 | Wt 207.5 lb

## 2017-06-02 DIAGNOSIS — Z79899 Other long term (current) drug therapy: Secondary | ICD-10-CM | POA: Insufficient documentation

## 2017-06-02 DIAGNOSIS — N921 Excessive and frequent menstruation with irregular cycle: Secondary | ICD-10-CM | POA: Diagnosis not present

## 2017-06-02 DIAGNOSIS — D500 Iron deficiency anemia secondary to blood loss (chronic): Secondary | ICD-10-CM

## 2017-06-02 LAB — CBC WITH DIFFERENTIAL (CANCER CENTER ONLY)
BASOS ABS: 0 10*3/uL (ref 0.0–0.1)
BASOS PCT: 1 %
Eosinophils Absolute: 0.1 10*3/uL (ref 0.0–0.5)
Eosinophils Relative: 2 %
HEMATOCRIT: 35.6 % (ref 34.8–46.6)
Hemoglobin: 11.5 g/dL — ABNORMAL LOW (ref 11.6–15.9)
LYMPHS PCT: 37 %
Lymphs Abs: 2 10*3/uL (ref 0.9–3.3)
MCH: 28.9 pg (ref 26.0–34.0)
MCHC: 32.3 g/dL (ref 32.0–36.0)
MCV: 89.4 fL (ref 81.0–101.0)
Monocytes Absolute: 0.5 10*3/uL (ref 0.1–0.9)
Monocytes Relative: 9 %
NEUTROS ABS: 2.7 10*3/uL (ref 1.5–6.5)
Neutrophils Relative %: 51 %
PLATELETS: 276 10*3/uL (ref 145–400)
RBC: 3.98 MIL/uL (ref 3.70–5.32)
RDW: 12.7 % (ref 11.1–15.7)
WBC: 5.3 10*3/uL (ref 3.9–10.3)

## 2017-06-02 LAB — RETICULOCYTES
RBC.: 4.09 MIL/uL (ref 3.70–5.45)
RETIC COUNT ABSOLUTE: 73.6 10*3/uL (ref 33.7–90.7)
Retic Ct Pct: 1.8 % (ref 0.7–2.1)

## 2017-06-02 LAB — IRON AND TIBC
IRON: 46 ug/dL (ref 41–142)
Saturation Ratios: 18 % — ABNORMAL LOW (ref 21–57)
TIBC: 253 ug/dL (ref 236–444)
UIBC: 207 ug/dL

## 2017-06-02 LAB — FERRITIN: Ferritin: 20 ng/mL (ref 9–269)

## 2017-06-02 NOTE — Progress Notes (Signed)
Hematology and Oncology Follow Up Visit  [name] [id number] July 13, 1965 52 y.o. 06/02/2017   Principle Diagnosis:   Iron deficiency anemia secondary to menometrorrhagia  Current Therapy:    Oral iron     Interim History:  Ms. [name] is back for follow-up.  We first saw her back in November.  At that time, she was sent over because of iron deficiency anemia.  She did not want IV iron.  As such, she is on oral iron.  Only saw her, her ferritin was 21 with iron saturation of 19%.  She still has her monthly cycles.  They have been not too heavy.  She has had no bleeding otherwise..  She is not chewing ice.  She and her family will be going down to [place] in a month or so.  She really wants to get away from the cold weather.  She has had no fever.  She has had no cough or shortness of breath.  There is no change in bowel or bladder habits.  Overall, her performance status is ECOG 0.  Medications:  Current Outpatient Medications:  .  Dexlansoprazole (DEXILANT PO), Take by mouth every morning., Disp: , Rfl:  .  LEVOCETIRIZINE DIHYDROCHLORIDE PO, Take by mouth every morning., Disp: , Rfl:  .  amLODipine-valsartan (EXFORGE) 10-320 MG tablet, , Disp: , Rfl:  .  Biotin w/ Vitamins C & E (HAIR/SKIN/NAILS PO), Take by mouth., Disp: , Rfl:  .  Cholecalciferol (VITAMIN D3) 2000 units capsule, , Disp: , Rfl:  .  ferrous sulfate 325 (65 FE) MG tablet, , Disp: , Rfl:  .  FIBER ADULT GUMMIES PO, Take by mouth., Disp: , Rfl:  .  FINACEA 15 % FOAM, , Disp: , Rfl:  .  hydrochlorothiazide (HYDRODIURIL) 25 MG tablet, , Disp: , Rfl:  .  ketoconazole (NIZORAL) 2 % cream, , Disp: , Rfl:  .  Multiple Vitamin (MULTIVITAMIN) capsule, Take 1 capsule by mouth. , Disp: , Rfl:  .  SPIRULINA PO, Take by mouth daily., Disp: , Rfl:  .  TAZORAC 0.05 % cream, , Disp: , Rfl:   Allergies:  Allergies  Allergen Reactions  . Minocycline Hcl Hives and Itching  . Sulfa Antibiotics Swelling    Swell  of the lips     Past Medical History, Surgical history, Social history, and Family History were reviewed and updated.  Review of Systems: Review of Systems  Constitutional: Negative.   HENT:  Negative.   Eyes: Negative.   Respiratory: Negative.   Cardiovascular: Negative.   Gastrointestinal: Negative.   Endocrine: Negative.   Genitourinary: Negative.    Musculoskeletal: Negative.   Skin: Negative.   Neurological: Negative.   Hematological: Negative.   Psychiatric/Behavioral: Negative.     Physical Exam:  weight is 207 lb 8 oz (94.1 kg). Her oral temperature is 98.7 F (37.1 C). Her blood pressure is 134/79 and her pulse is 74. Her respiration is 18 and oxygen saturation is 98%.   Wt Readings from Last 3 Encounters:  06/02/17 207 lb 8 oz (94.1 kg)  04/12/17 204 lb (92.5 kg)  01/24/17 204 lb 8 oz (92.8 kg)    Physical Exam  Constitutional: She is oriented to person, place, and time.  HENT:  Head: Normocephalic and atraumatic.  Mouth/Throat: Oropharynx is clear and moist.  Eyes: EOM are normal. Pupils are equal, round, and reactive to light.  Neck: Normal range of motion.  Cardiovascular: Normal rate, regular rhythm and normal heart sounds.  Pulmonary/Chest:  Effort normal and breath sounds normal.  Abdominal: Soft. Bowel sounds are normal.  Musculoskeletal: Normal range of motion. She exhibits no edema, tenderness or deformity.  Lymphadenopathy:    She has no cervical adenopathy.  Neurological: She is alert and oriented to person, place, and time.  Skin: Skin is warm and dry. No rash noted. No erythema.  Psychiatric: She has a normal mood and affect. Her behavior is normal. Judgment and thought content normal.  Vitals reviewed.    Lab Results  Component Value Date   WBC 5.3 06/02/2017   HGB 11.5 (L) 04/12/2017   HCT 35.6 06/02/2017   MCV 89.4 06/02/2017   PLT 276 06/02/2017     Chemistry      Component Value Date/Time   NA 142 04/12/2017 0858   K 3.8  04/12/2017 0858   CL 104 04/12/2017 0858   CO2 26 04/12/2017 0858   BUN 9 04/12/2017 0858   CREATININE 0.8 04/12/2017 0858      Component Value Date/Time   CALCIUM 9.2 04/12/2017 0858   ALKPHOS 74 04/12/2017 0858   AST 25 04/12/2017 0858   ALT 26 04/12/2017 0858   BILITOT 0.60 04/12/2017 0858         Impression and Plan: Ms. [name] is a 52 year old African-American female.  She has menometrorrhagia.  She has mild iron deficiency.  Her hemoglobin is holding steady compared to when we last saw her.  We will see what her iron studies show.  I talked her about IV iron.  She wants to avoid IV iron.  I think we can get her back in 3 months.  I do not see any problems with her coming back in 3 months.  If she has any issues before then, we will be more than happy to see her.  It was a lot of fun talking with her.   [name], MD 1/17/201910:33 AM

## 2017-06-03 ENCOUNTER — Telehealth: Payer: Self-pay | Admitting: *Deleted

## 2017-06-03 NOTE — Telephone Encounter (Addendum)
Patient is aware of results  ----- Message from [name], MD sent at 06/02/2017  1:11 PM EST ----- Call - iron studies are a [name] low but stable!!  keep up with the oral iron!!!  [name]

## 2017-06-06 ENCOUNTER — Other Ambulatory Visit: Payer: Self-pay | Admitting: Obstetrics & Gynecology

## 2017-06-06 DIAGNOSIS — Z1231 Encounter for screening mammogram for malignant neoplasm of breast: Secondary | ICD-10-CM

## 2017-06-24 ENCOUNTER — Ambulatory Visit
Admission: RE | Admit: 2017-06-24 | Discharge: 2017-06-24 | Disposition: A | Discharge status: Home / Self Care | Payer: BLUE CROSS/BLUE SHIELD | Source: Ambulatory Visit | Attending: Obstetrics & Gynecology | Admitting: Obstetrics & Gynecology

## 2017-06-24 DIAGNOSIS — Z1231 Encounter for screening mammogram for malignant neoplasm of breast: Secondary | ICD-10-CM

## 2017-09-12 ENCOUNTER — Other Ambulatory Visit: Payer: BLUE CROSS/BLUE SHIELD

## 2017-09-12 ENCOUNTER — Ambulatory Visit: Payer: BLUE CROSS/BLUE SHIELD | Admitting: Hematology & Oncology

## 2017-09-23 ENCOUNTER — Inpatient Hospital Stay (HOSPITAL_BASED_OUTPATIENT_CLINIC_OR_DEPARTMENT_OTHER): Payer: BLUE CROSS/BLUE SHIELD | Admitting: Hematology & Oncology

## 2017-09-23 ENCOUNTER — Inpatient Hospital Stay: Payer: BLUE CROSS/BLUE SHIELD | Attending: Hematology & Oncology

## 2017-09-23 ENCOUNTER — Encounter: Payer: Self-pay | Admitting: *Deleted

## 2017-09-23 ENCOUNTER — Other Ambulatory Visit: Payer: Self-pay

## 2017-09-23 VITALS — BP 119/68 | HR 82 | Temp 98.8°F | Resp 16 | Wt 196.0 lb

## 2017-09-23 DIAGNOSIS — Z79899 Other long term (current) drug therapy: Secondary | ICD-10-CM

## 2017-09-23 DIAGNOSIS — D500 Iron deficiency anemia secondary to blood loss (chronic): Secondary | ICD-10-CM

## 2017-09-23 DIAGNOSIS — N921 Excessive and frequent menstruation with irregular cycle: Secondary | ICD-10-CM | POA: Insufficient documentation

## 2017-09-23 LAB — CBC WITH DIFFERENTIAL (CANCER CENTER ONLY)
BASOS PCT: 1 %
Basophils Absolute: 0 10*3/uL (ref 0.0–0.1)
EOS ABS: 0.1 10*3/uL (ref 0.0–0.5)
EOS PCT: 1 %
HCT: 36.3 % (ref 34.8–46.6)
HEMOGLOBIN: 11.5 g/dL — AB (ref 11.6–15.9)
LYMPHS ABS: 1.7 10*3/uL (ref 0.9–3.3)
Lymphocytes Relative: 32 %
MCH: 28.8 pg (ref 26.0–34.0)
MCHC: 31.7 g/dL — AB (ref 32.0–36.0)
MCV: 91 fL (ref 81.0–101.0)
MONOS PCT: 9 %
Monocytes Absolute: 0.5 10*3/uL (ref 0.1–0.9)
NEUTROS PCT: 57 %
Neutro Abs: 3.2 10*3/uL (ref 1.5–6.5)
PLATELETS: 285 10*3/uL (ref 145–400)
RBC: 3.99 MIL/uL (ref 3.70–5.32)
RDW: 12.5 % (ref 11.1–15.7)
WBC Count: 5.5 10*3/uL (ref 3.9–10.0)

## 2017-09-23 LAB — IRON AND TIBC
Iron: 43 ug/dL (ref 41–142)
Saturation Ratios: 16 % — ABNORMAL LOW (ref 21–57)
TIBC: 264 ug/dL (ref 236–444)
UIBC: 220 ug/dL

## 2017-09-23 LAB — FERRITIN: Ferritin: 22 ng/mL (ref 9–269)

## 2017-09-23 LAB — RETICULOCYTES
RBC.: 4.01 MIL/uL (ref 3.70–5.45)
RETIC CT PCT: 2.3 % — AB (ref 0.7–2.1)
Retic Count, Absolute: 92.2 10*3/uL — ABNORMAL HIGH (ref 33.7–90.7)

## 2017-09-23 NOTE — Progress Notes (Signed)
Hematology and Oncology Follow Up Visit  Bricelyn Freestone [id number] [birth year] 52 y.o. 09/23/2017   Principle Diagnosis:   Iron deficiency anemia secondary to menometrorrhagia  Current Therapy:    Oral iron     Interim History:  Ms. Keeble is back for follow-up.  She is doing quite well.  We last saw her back in January.  Since then, she is done some traveling.  She is been down to [place].  She was in [place] in February.  She really enjoyed herself down there.  She is still taking oral iron.  She is having no problems with oral iron.  When we last saw her in January, her ferritin was 20 with an iron saturation of 18%.  She is had no problems at work.  She is quite busy at work.  She is working out.  She enjoys working out.  She is watching her diet closely.  There is no change in bowel or bladder habits.  Overall, her performance status is ECOG 0.  Medications:  Current Outpatient Medications:  .  amLODipine-valsartan (EXFORGE) 10-320 MG tablet, , Disp: , Rfl:  .  Biotin w/ Vitamins C & E (HAIR/SKIN/NAILS PO), Take by mouth., Disp: , Rfl:  .  Cholecalciferol (VITAMIN D3) 2000 units capsule, , Disp: , Rfl:  .  Dexlansoprazole (DEXILANT PO), Take by mouth every morning., Disp: , Rfl:  .  ferrous sulfate 325 (65 FE) MG tablet, , Disp: , Rfl:  .  FIBER ADULT GUMMIES PO, Take by mouth., Disp: , Rfl:  .  FINACEA 15 % FOAM, , Disp: , Rfl:  .  hydrochlorothiazide (HYDRODIURIL) 25 MG tablet, , Disp: , Rfl:  .  ketoconazole (NIZORAL) 2 % cream, , Disp: , Rfl:  .  LEVOCETIRIZINE DIHYDROCHLORIDE PO, Take by mouth every morning., Disp: , Rfl:  .  meclizine (ANTIVERT) 25 MG tablet, Take 25 mg by mouth., Disp: , Rfl: 0 .  Multiple Vitamin (MULTIVITAMIN) capsule, Take 1 capsule by mouth. , Disp: , Rfl:  .  SPIRULINA PO, Take by mouth daily., Disp: , Rfl:  .  sucralfate (CARAFATE) 1 g tablet, Take 1 g by mouth., Disp: , Rfl: 0 .  TAZORAC 0.05 % cream, , Disp: , Rfl:   Allergies:    Allergies  Allergen Reactions  . Minocycline Hcl Hives and Itching  . Sulfa Antibiotics Swelling    Swell of the lips   . Minocycline     Past Medical History, Surgical history, Social history, and Family History were reviewed and updated.  Review of Systems: Review of Systems  Constitutional: Negative.   HENT:  Negative.   Eyes: Negative.   Respiratory: Negative.   Cardiovascular: Negative.   Gastrointestinal: Negative.   Endocrine: Negative.   Genitourinary: Negative.    Musculoskeletal: Negative.   Skin: Negative.   Neurological: Negative.   Hematological: Negative.   Psychiatric/Behavioral: Negative.     Physical Exam:  weight is 196 lb (88.9 kg). Her oral temperature is 98.8 F (37.1 C). Her blood pressure is 119/68 and her pulse is 82. Her respiration is 16 and oxygen saturation is 100%.   Wt Readings from Last 3 Encounters:  09/23/17 196 lb (88.9 kg)  06/02/17 207 lb 8 oz (94.1 kg)  04/12/17 204 lb (92.5 kg)    Physical Exam  Constitutional: She is oriented to person, place, and time.  HENT:  Head: Normocephalic and atraumatic.  Mouth/Throat: Oropharynx is clear and moist.  Eyes: Pupils are equal, round, and reactive  to light. EOM are normal.  Neck: Normal range of motion.  Cardiovascular: Normal rate, regular rhythm and normal heart sounds.  Pulmonary/Chest: Effort normal and breath sounds normal.  Abdominal: Soft. Bowel sounds are normal.  Musculoskeletal: Normal range of motion. She exhibits no edema, tenderness or deformity.  Lymphadenopathy:    She has no cervical adenopathy.  Neurological: She is alert and oriented to person, place, and time.  Skin: Skin is warm and dry. No rash noted. No erythema.  Psychiatric: She has a normal mood and affect. Her behavior is normal. Judgment and thought content normal.  Vitals reviewed.    Lab Results  Component Value Date   WBC 5.5 09/23/2017   HGB 11.5 (L) 09/23/2017   HCT 36.3 09/23/2017   MCV 91.0  09/23/2017   PLT 285 09/23/2017     Chemistry      Component Value Date/Time   NA 142 04/12/2017 0858   K 3.8 04/12/2017 0858   CL 104 04/12/2017 0858   CO2 26 04/12/2017 0858   BUN 9 04/12/2017 0858   CREATININE 0.8 04/12/2017 0858      Component Value Date/Time   CALCIUM 9.2 04/12/2017 0858   ALKPHOS 74 04/12/2017 0858   AST 25 04/12/2017 0858   ALT 26 04/12/2017 0858   BILITOT 0.60 04/12/2017 0858         Impression and Plan: Ms. Deignan is a 52 year old African-American female.  She has menometrorrhagia.  She has mild iron deficiency.  Her hemoglobin is holding steady compared to when we last saw her.  We will see what her iron studies show.  I think we can get her back in 6 months.  I do not see any problems with her coming back in 6 months.  If she has any issues before then, we will be more than happy to see her.  It was a lot of fun talking with her.   [name], MD 5/10/20198:46 AM

## 2018-01-24 ENCOUNTER — Other Ambulatory Visit: Payer: Self-pay | Admitting: Gastroenterology

## 2018-01-24 DIAGNOSIS — R1310 Dysphagia, unspecified: Secondary | ICD-10-CM

## 2018-01-27 ENCOUNTER — Ambulatory Visit
Admission: RE | Admit: 2018-01-27 | Discharge: 2018-01-27 | Disposition: A | Discharge status: Home / Self Care | Payer: BLUE CROSS/BLUE SHIELD | Source: Ambulatory Visit | Attending: Gastroenterology | Admitting: Gastroenterology

## 2018-01-27 DIAGNOSIS — R1310 Dysphagia, unspecified: Secondary | ICD-10-CM

## 2018-03-16 ENCOUNTER — Other Ambulatory Visit: Payer: Self-pay | Admitting: Family Medicine

## 2018-03-20 ENCOUNTER — Inpatient Hospital Stay: Payer: BLUE CROSS/BLUE SHIELD

## 2018-03-20 ENCOUNTER — Inpatient Hospital Stay: Payer: BLUE CROSS/BLUE SHIELD | Attending: Hematology & Oncology | Admitting: Hematology & Oncology

## 2018-03-20 ENCOUNTER — Other Ambulatory Visit: Payer: Self-pay

## 2018-03-20 ENCOUNTER — Encounter: Payer: Self-pay | Admitting: *Deleted

## 2018-03-20 ENCOUNTER — Encounter: Payer: Self-pay | Admitting: Hematology & Oncology

## 2018-03-20 VITALS — BP 127/71 | HR 72 | Temp 98.7°F | Resp 16 | Wt 201.0 lb

## 2018-03-20 DIAGNOSIS — N921 Excessive and frequent menstruation with irregular cycle: Secondary | ICD-10-CM

## 2018-03-20 DIAGNOSIS — D500 Iron deficiency anemia secondary to blood loss (chronic): Secondary | ICD-10-CM | POA: Diagnosis present

## 2018-03-20 DIAGNOSIS — Z79899 Other long term (current) drug therapy: Secondary | ICD-10-CM | POA: Diagnosis not present

## 2018-03-20 LAB — CBC WITH DIFFERENTIAL (CANCER CENTER ONLY)
Abs Immature Granulocytes: 0.14 10*3/uL — ABNORMAL HIGH (ref 0.00–0.07)
BASOS ABS: 0 10*3/uL (ref 0.0–0.1)
Basophils Relative: 1 %
EOS PCT: 3 %
Eosinophils Absolute: 0.2 10*3/uL (ref 0.0–0.5)
HEMATOCRIT: 37.3 % (ref 36.0–46.0)
HEMOGLOBIN: 11.5 g/dL — AB (ref 12.0–15.0)
Immature Granulocytes: 2 %
LYMPHS ABS: 1.7 10*3/uL (ref 0.7–4.0)
LYMPHS PCT: 28 %
MCH: 27.4 pg (ref 26.0–34.0)
MCHC: 30.8 g/dL (ref 30.0–36.0)
MCV: 89 fL (ref 80.0–100.0)
MONO ABS: 0.5 10*3/uL (ref 0.1–1.0)
Monocytes Relative: 7 %
Neutro Abs: 3.6 10*3/uL (ref 1.7–7.7)
Neutrophils Relative %: 59 %
Platelet Count: 283 10*3/uL (ref 150–400)
RBC: 4.19 MIL/uL (ref 3.87–5.11)
RDW: 12.8 % (ref 11.5–15.5)
WBC Count: 6.1 10*3/uL (ref 4.0–10.5)
nRBC: 0 % (ref 0.0–0.2)

## 2018-03-20 LAB — FERRITIN: Ferritin: 20 ng/mL (ref 11–307)

## 2018-03-20 LAB — IRON AND TIBC
Iron: 54 ug/dL (ref 41–142)
Saturation Ratios: 20 % — ABNORMAL LOW (ref 21–57)
TIBC: 266 ug/dL (ref 236–444)
UIBC: 213 ug/dL (ref 120–384)

## 2018-03-20 LAB — CMP (CANCER CENTER ONLY)
ALBUMIN: 3.4 g/dL — AB (ref 3.5–5.0)
ALT: 24 U/L (ref 10–47)
AST: 21 U/L (ref 11–38)
Alkaline Phosphatase: 63 U/L (ref 26–84)
Anion gap: 4 — ABNORMAL LOW (ref 5–15)
BUN: 12 mg/dL (ref 7–22)
CHLORIDE: 105 mmol/L (ref 98–108)
CO2: 27 mmol/L (ref 18–33)
CREATININE: 1 mg/dL (ref 0.60–1.20)
Calcium: 9.5 mg/dL (ref 8.0–10.3)
GLUCOSE: 100 mg/dL (ref 73–118)
Potassium: 4.2 mmol/L (ref 3.3–4.7)
Sodium: 136 mmol/L (ref 128–145)
Total Bilirubin: 0.6 mg/dL (ref 0.2–1.6)
Total Protein: 8 g/dL (ref 6.4–8.1)

## 2018-03-20 NOTE — Progress Notes (Signed)
Hematology and Oncology Follow Up Visit  [name] [id number] 21-Aug-1965 52 y.o. 03/20/2018   Principle Diagnosis:   Iron deficiency anemia secondary to menometrorrhagia  Current Therapy:    Oral iron     Interim History:  [name] is back for follow-up.  She is doing quite well.  She really has not had any issues.  She had a good summer.  They will be going to [facility] for Christmas.  She is doing well with iron.  She is taking oral iron.  She also needs to be on some vitamin C.  I told her to take 500 mg daily of vitamin C.  She has had no change in bowel or bladder habits.  She has had no rashes.  There is been no cough.  She is had no infections.  Her iron studies that we did back in May showed a ferritin of 22 with an iron saturation of 16%.  Overall, her performance status is ECOG 0.  .  Medications:  Current Outpatient Medications:  .  amLODipine-valsartan (EXFORGE) 10-320 MG tablet, , Disp: , Rfl:  .  Biotin w/ Vitamins C & E (HAIR/SKIN/NAILS PO), Take by mouth., Disp: , Rfl:  .  Cholecalciferol (VITAMIN D3) 2000 units capsule, , Disp: , Rfl:  .  Dexlansoprazole (DEXILANT PO), Take by mouth every morning., Disp: , Rfl:  .  ferrous sulfate 325 (65 FE) MG tablet, , Disp: , Rfl:  .  FIBER ADULT GUMMIES PO, Take by mouth., Disp: , Rfl:  .  FINACEA 15 % FOAM, , Disp: , Rfl:  .  hydrochlorothiazide (HYDRODIURIL) 25 MG tablet, , Disp: , Rfl:  .  ketoconazole (NIZORAL) 2 % cream, , Disp: , Rfl:  .  LEVOCETIRIZINE DIHYDROCHLORIDE PO, Take by mouth every morning., Disp: , Rfl:  .  meclizine (ANTIVERT) 25 MG tablet, Take 25 mg by mouth., Disp: , Rfl: 0 .  Multiple Vitamin (MULTIVITAMIN) capsule, Take 1 capsule by mouth. , Disp: , Rfl:  .  SPIRULINA PO, Take by mouth daily., Disp: , Rfl:  .  sucralfate (CARAFATE) 1 g tablet, Take 1 g by mouth., Disp: , Rfl: 0 .  TAZORAC 0.05 % cream, , Disp: , Rfl:   Allergies:  Allergies  Allergen Reactions  .  Minocycline Hcl Hives and Itching  . Sulfa Antibiotics Swelling    Swell of the lips   . Minocycline     Past Medical History, Surgical history, Social history, and Family History were reviewed and updated.  Review of Systems: Review of Systems  Constitutional: Negative.   HENT:  Negative.   Eyes: Negative.   Respiratory: Negative.   Cardiovascular: Negative.   Gastrointestinal: Negative.   Endocrine: Negative.   Genitourinary: Negative.    Musculoskeletal: Negative.   Skin: Negative.   Neurological: Negative.   Hematological: Negative.   Psychiatric/Behavioral: Negative.     Physical Exam:  vitals were not taken for this visit.   Wt Readings from Last 3 Encounters:  09/23/17 196 lb (88.9 kg)  06/02/17 207 lb 8 oz (94.1 kg)  04/12/17 204 lb (92.5 kg)    Physical Exam  Constitutional: She is oriented to person, place, and time.  HENT:  Head: Normocephalic and atraumatic.  Mouth/Throat: Oropharynx is clear and moist.  Eyes: Pupils are equal, round, and reactive to light. EOM are normal.  Neck: Normal range of motion.  Cardiovascular: Normal rate, regular rhythm and normal heart sounds.  Pulmonary/Chest: Effort normal and breath sounds  normal.  Abdominal: Soft. Bowel sounds are normal.  Musculoskeletal: Normal range of motion. She exhibits no edema, tenderness or deformity.  Lymphadenopathy:    She has no cervical adenopathy.  Neurological: She is alert and oriented to person, place, and time.  Skin: Skin is warm and dry. No rash noted. No erythema.  Psychiatric: She has a normal mood and affect. Her behavior is normal. Judgment and thought content normal.  Vitals reviewed.    Lab Results  Component Value Date   WBC 6.1 03/20/2018   HGB 11.5 (L) 03/20/2018   HCT 37.3 03/20/2018   MCV 89.0 03/20/2018   PLT 283 03/20/2018     Chemistry      Component Value Date/Time   NA 142 04/12/2017 0858   K 3.8 04/12/2017 0858   CL 104 04/12/2017 0858   CO2 26  04/12/2017 0858   BUN 9 04/12/2017 0858   CREATININE 0.8 04/12/2017 0858      Component Value Date/Time   CALCIUM 9.2 04/12/2017 0858   ALKPHOS 74 04/12/2017 0858   AST 25 04/12/2017 0858   ALT 26 04/12/2017 0858   BILITOT 0.60 04/12/2017 0858         Impression and Plan: [name] is a 52 year old African-American female.  She has menometrorrhagia.  She has mild iron deficiency.  Her hemoglobin is holding steady compared to when we last saw her.  We will see what her iron studies show.  I think we can get her back in 6 months.  I do not see any problems with her coming back in 6 months.  If she has any issues before then, we will be more than happy to see her.  It was a lot of fun talking with her.   [name], MD 11/4/20198:20 AM

## 2018-03-31 ENCOUNTER — Other Ambulatory Visit (HOSPITAL_COMMUNITY)
Admission: RE | Admit: 2018-03-31 | Discharge: 2018-03-31 | Disposition: A | Discharge status: Home / Self Care | Payer: BLUE CROSS/BLUE SHIELD | Source: Ambulatory Visit | Attending: Obstetrics & Gynecology | Admitting: Obstetrics & Gynecology

## 2018-03-31 ENCOUNTER — Encounter

## 2018-03-31 ENCOUNTER — Ambulatory Visit: Payer: BLUE CROSS/BLUE SHIELD | Admitting: Obstetrics & Gynecology

## 2018-03-31 ENCOUNTER — Encounter: Payer: Self-pay | Admitting: Obstetrics & Gynecology

## 2018-03-31 VITALS — BP 122/68 | HR 76 | Resp 14 | Ht 64.0 in | Wt 203.0 lb

## 2018-03-31 DIAGNOSIS — Z124 Encounter for screening for malignant neoplasm of cervix: Secondary | ICD-10-CM | POA: Insufficient documentation

## 2018-03-31 DIAGNOSIS — Z01411 Encounter for gynecological examination (general) (routine) with abnormal findings: Secondary | ICD-10-CM

## 2018-03-31 NOTE — Progress Notes (Signed)
52 y.o. G42P0030 Married [name] here for annual exam.  Doing well.  Cycles are still regular.  Flow lasts 5-6 days but there are still heavy days at times.  Seeing Dr. Marin Olp every six months.  On oral iron.   PCP:  Dr. Jonelle Sidle.  Has been seen this year.  Patient's last menstrual period was 03/10/2018 (exact date).          Sexually active: Yes.    The current method of family planning is none.    Exercising: Yes.    elliptical, walk Smoker:  no  Health Maintenance: Pap:  01/23/16 neg. HR HPV:Neg   10/28/14 Neg  History of abnormal Pap:  yes MMG:  06/24/17 BIRADS1:Neg Colonoscopy:  Aware of this.  Going to Dr. Collene Mares for this. BMD:   Never TDaP:  2014 Shingles:  Considering after her birhtday Hep C testing: 10/28/14 Neg  Screening Labs: Hematology    reports that she has never smoked. She has never used smokeless tobacco. She reports that she drinks alcohol. She reports that she does not use drugs.  Past Medical History:  Diagnosis Date  . Abnormal Pap smear 7/13   ASCUS/positive HR HPV, negative colpo  . Anemia 2006  . Condylomata acuminata in [place]   . Fibroids    with enlarged uterus  . Hypertension   . Pulmonary embolism ([place]) 2008   secondary to [facility]    Past Surgical History:  Procedure Laterality Date  . IR GENERIC HISTORICAL  03/31/2016   IR RADIOLOGIST EVAL & MGMT 03/31/2016 Marybelle Killings, MD [facility] INTERV RAD  . IR GENERIC HISTORICAL  01/06/2016   IR RADIOLOGIST EVAL & MGMT 01/06/2016 Marybelle Killings, MD [facility] INTERV RAD  . left finger reattachment  age 81  . LEFT OOPHORECTOMY Left 1998      . MYOMECTOMY  2003  . MYOMECTOMY  2008   via laparoscopy    Current Outpatient Medications  Medication Sig Dispense Refill  . amLODipine-valsartan (EXFORGE) 10-320 MG tablet Take 1 tablet by mouth daily.     . Cholecalciferol (VITAMIN D3) 2000 units capsule     . Dexlansoprazole (DEXILANT PO) Take 1 tablet by mouth every morning.     . ferrous sulfate 325  (65 FE) MG tablet     . hydrochlorothiazide (HYDRODIURIL) 25 MG tablet Take 25 mg by mouth daily.     . Probiotic Product (PROBIOTIC-10) CAPS Take by mouth daily as needed.    Marland Kitchen SPIRULINA PO Take by mouth daily.     No current facility-administered medications for this visit.     Family History  Problem Relation Age of Onset  . Diabetes Mother   . Hypertension Mother   . Heart disease Mother        cardiomegaly  . Lupus Mother   . Diabetes Father   . Hypertension Father   . Stroke Father   . Hypertension Maternal Grandmother   . Stroke Maternal Grandmother   . Hypertension Sister   . Seizures Sister   . COPD Sister     Review of Systems  All other systems reviewed and are negative.   Exam:   BP 122/68 (BP Location: Right Arm, Patient Position: Sitting, Cuff Size: Large)   Pulse 76   Resp 14   Ht 5\' 4"  (1.626 m)   Wt 203 lb (92.1 kg)   LMP 03/10/2018 (Exact Date)   BMI 34.84 kg/m      Height: 5\' 4"  (162.6 cm)  Ht Readings from Last 3 Encounters:  03/31/18 5\' 4"  (1.626 m)  01/24/17 5\' 4"  (1.626 m)  10/12/16 5' 4.5" (1.638 m)    General appearance: alert, cooperative and appears stated age Head: Normocephalic, without obvious abnormality, atraumatic Neck: no adenopathy, supple, symmetrical, trachea midline and thyroid normal to inspection and palpation Lungs: clear to auscultation bilaterally Breasts: normal appearance, no masses or tenderness Heart: regular rate and rhythm Abdomen: soft, non-tender; bowel sounds normal; no masses,  no organomegaly Extremities: extremities normal, atraumatic, no cyanosis or edema Skin: Skin color, texture, turgor normal. No rashes or lesions Lymph nodes: Cervical, supraclavicular, and axillary nodes normal. No abnormal inguinal nodes palpated Neurologic: Grossly normal   Pelvic: External genitalia:  no lesions              Urethra:  normal appearing urethra with no masses, tenderness or lesions              Bartholins and  Skenes: normal                 Vagina: normal appearing vagina with normal color and discharge, no lesions              Cervix: no lesions              Pap taken: Yes.   Bimanual Exam:  Uterus:  enlarged,  weeks size 12-14 with fullness of on right              Adnexa: no mass, fullness, tenderness               Rectovaginal: Confirms               Anus:  normal sphincter tone, no lesions  Chaperone was present for exam.  A:  Well Woman with normal exam H/O uterine fibroids s/p [place] 5/17, myomectomy x 2 in 2003, 2008 Hypertension H/O condyloma H/o iron deficiency anemia, being followed by Dr. Marin Olp H/O PE ASCUS pap with +HR HPV 2013.  Neg pap and neg HR HPV 2015 and 2017.    P:   Mammogram guidelines reviewed Aware colonoscopy due after birthday.  Does not need referral Blood work done with Dr. Jonelle Sidle, PCP pap smear obtained today Shingrix vaccination discussed Return annually or prn

## 2018-04-04 LAB — CYTOLOGY - PAP: DIAGNOSIS: NEGATIVE

## 2018-04-28 ENCOUNTER — Other Ambulatory Visit: Payer: Self-pay

## 2018-04-28 ENCOUNTER — Ambulatory Visit (INDEPENDENT_AMBULATORY_CARE_PROVIDER_SITE_OTHER): Payer: BLUE CROSS/BLUE SHIELD | Admitting: Obstetrics & Gynecology

## 2018-04-28 ENCOUNTER — Encounter: Payer: Self-pay | Admitting: Obstetrics & Gynecology

## 2018-04-28 VITALS — BP 122/72 | HR 84 | Resp 16 | Ht 64.0 in | Wt 206.4 lb

## 2018-04-28 DIAGNOSIS — N898 Other specified noninflammatory disorders of vagina: Secondary | ICD-10-CM

## 2018-04-28 DIAGNOSIS — R3915 Urgency of urination: Secondary | ICD-10-CM | POA: Diagnosis not present

## 2018-04-28 LAB — POCT URINALYSIS DIPSTICK
Bilirubin, UA: NEGATIVE
Blood, UA: NEGATIVE
Glucose, UA: NEGATIVE
Ketones, UA: NEGATIVE
Leukocytes, UA: NEGATIVE
Nitrite, UA: NEGATIVE
PH UA: 5 (ref 5.0–8.0)
Protein, UA: POSITIVE — AB
UROBILINOGEN UA: 0.2 U/dL

## 2018-04-28 MED ORDER — NITROFURANTOIN MONOHYD MACRO 100 MG PO CAPS: 100.0000 mg | ORAL_CAPSULE | Freq: Two times a day (BID) | ORAL | 0 refills | Status: DC

## 2018-04-28 NOTE — Progress Notes (Deleted)
52 y.o. G49P0030 Married [place] American female here for annual exam.    No LMP recorded.          Sexually active: {yes no:314532}  The current method of family planning is {contraception:315051}.    Exercising: {yes no:314532}  {types:19826} Smoker:  {YES NO:22349}  Health Maintenance: Pap:  03/31/18 Neg   01/23/16 Neg. HR HPV:neg  History of abnormal Pap:  Yes, HR HPV+ 2013 MMG:  06/24/17 BIRADS1:neg  Colonoscopy:  *** BMD:   *** TDaP:  2014 Pneumonia vaccine(s):  *** Shingrix:   *** Hep C testing: 10/28/14 Neg  Screening Labs: ***, Hb today: ***, Urine today: ***   reports that she has never smoked. She has never used smokeless tobacco. She reports current alcohol use. She reports that she does not use drugs.  Past Medical History:  Diagnosis Date  . Abnormal Pap smear 7/13   ASCUS/positive HR HPV, negative colpo  . Anemia 2006  . 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 Fibroids    with enlarged uterus  . Hypertension   . Pulmonary embolism ([place]) 2008   secondary to [facility]    Past Surgical History:  Procedure Laterality Date  . IR GENERIC HISTORICAL  03/31/2016   IR RADIOLOGIST EVAL & MGMT 03/31/2016 Marybelle Killings, MD [facility] INTERV RAD  . IR GENERIC HISTORICAL  01/06/2016   IR RADIOLOGIST EVAL & MGMT 01/06/2016 Marybelle Killings, MD [facility] INTERV RAD  . left finger reattachment  age 32  . LEFT OOPHORECTOMY Left 1998      . MYOMECTOMY  2003  . MYOMECTOMY  2008   via laparoscopy    Current Outpatient Medications  Medication Sig Dispense Refill  . amLODipine-valsartan (EXFORGE) 10-320 MG tablet Take 1 tablet by mouth daily.     . Cholecalciferol (VITAMIN D3) 2000 units capsule     . Dexlansoprazole (DEXILANT PO) Take 1 tablet by mouth every morning.     . ferrous sulfate 325 (65 FE) MG tablet     . hydrochlorothiazide (HYDRODIURIL) 25 MG tablet Take 25 mg by mouth daily.     . Probiotic Product (PROBIOTIC-10) CAPS Take by mouth daily as needed.    Marland Kitchen SPIRULINA PO Take by  mouth daily.     No current facility-administered medications for this visit.     Family History  Problem Relation Age of Onset  . Diabetes Mother   . Hypertension Mother   . Heart disease Mother        cardiomegaly  . Lupus Mother   . Diabetes Father   . Hypertension Father   . Stroke Father   . Hypertension Maternal Grandmother   . Stroke Maternal Grandmother   . Hypertension Sister   . Seizures Sister   . COPD Sister     Review of Systems  Exam:   There were no vitals taken for this visit.  Height:      Ht Readings from Last 3 Encounters:  03/31/18 5\' 4"  (1.626 m)  01/24/17 5\' 4"  (1.626 m)  10/12/16 5' 4.5" (1.638 m)    General appearance: alert, cooperative and appears stated age Head: Normocephalic, without obvious abnormality, atraumatic Neck: no adenopathy, supple, symmetrical, trachea midline and thyroid {EXAM; THYROID:18604} Lungs: clear to auscultation bilaterally Breasts: {Exam; breast:13139::"normal appearance, no masses or tenderness"} Heart: regular rate and rhythm Abdomen: soft, non-tender; bowel sounds normal; no masses,  no organomegaly Extremities: extremities normal, atraumatic, no cyanosis or edema Skin: Skin color, texture, turgor normal. No rashes or  lesions Lymph nodes: Cervical, supraclavicular, and axillary nodes normal. No abnormal inguinal nodes palpated Neurologic: Grossly normal   Pelvic: External genitalia:  no lesions              Urethra:  normal appearing urethra with no masses, tenderness or lesions              Bartholins and Skenes: normal                 Vagina: normal appearing vagina with normal color and discharge, no lesions              Cervix: {exam; cervix:14595}              Pap taken: {yes no:314532} Bimanual Exam:  Uterus:  {exam; uterus:12215}              Adnexa: {exam; adnexa:12223}               Rectovaginal: Confirms               Anus:  normal sphincter tone, no lesions  Chaperone was present for exam.  A:   Well Woman with normal exam  P:   {plan; gyn:5269::"mammogram","pap smear","return annually or prn"}

## 2018-04-28 NOTE — Progress Notes (Signed)
GYNECOLOGY  VISIT  CC:   Urinary urgency  HPI: 51 y.o. G49P0030 Married [name] American female here for urinary urgency that she's been having for about two weeks.  Denies vaginal bleeding.  Denies pelvic pain or back pain.  Denies fever or hematuria.    Denies vaginal discharge but urine does have a strong odor.  Last cycle was 04/08/18.  Flow lasted about six days.  Second day is the heaviest but "not like before".  GYNECOLOGIC HISTORY: Patient's last menstrual period was 04/08/2018. Contraception: none Menopausal hormone therapy: none  Patient Active Problem List   Diagnosis Date Noted  . Uterine leiomyoma 09/29/2015  . Fibroids 10/28/2014    Class: History of  . Female infertility 03/19/2014  . Numbness of face 01/04/2014  . Other malaise and fatigue 01/04/2014  . Disturbance of skin sensation 01/04/2014  . Dizziness and giddiness 08/28/2013  . Condyloma 05/31/2013  . Anemia, iron deficiency 12/25/2012  . HYPERTENSION 01/30/2009  . CYSTIC ACNE 01/30/2009  . HYPERSOMNIA 01/30/2009  . PALPITATIONS, HX OF 01/30/2009    Past Medical History:  Diagnosis Date  . Abnormal Pap smear 7/13   ASCUS/positive HR HPV, negative colpo  . Anemia 2006  . 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 Fibroids    with enlarged uterus  . Hypertension   . Pulmonary embolism ([place]) 2008   secondary to [facility]    Past Surgical History:  Procedure Laterality Date  . IR GENERIC HISTORICAL  03/31/2016   IR RADIOLOGIST EVAL & MGMT 03/31/2016 Marybelle Killings, MD [facility] INTERV RAD  . IR GENERIC HISTORICAL  01/06/2016   IR RADIOLOGIST EVAL & MGMT 01/06/2016 Marybelle Killings, MD [facility] INTERV RAD  . left finger reattachment  age 62  . LEFT OOPHORECTOMY Left 1998      . MYOMECTOMY  2003  . MYOMECTOMY  2008   via laparoscopy    MEDS:   Current Outpatient Medications on File Prior to Visit  Medication Sig Dispense Refill  . amLODipine-valsartan (EXFORGE) 10-320 MG tablet Take 1 tablet by mouth daily.      . Cholecalciferol (VITAMIN D3) 2000 units capsule     . Dexlansoprazole (DEXILANT PO) Take 1 tablet by mouth every morning.     . ferrous sulfate 325 (65 FE) MG tablet     . hydrochlorothiazide (HYDRODIURIL) 25 MG tablet Take 25 mg by mouth daily.     . Probiotic Product (PROBIOTIC-10) CAPS Take by mouth daily as needed.    Marland Kitchen SPIRULINA PO Take by mouth daily.    Marland Kitchen ketoconazole (NIZORAL) 2 % cream ketoconazole 2 % topical cream  APPLY TO THE BACK AND SHOULDERS BY TOPICAL ROUTE QHS    . ketoconazole (NIZORAL) 2 % shampoo ketoconazole 2 % shampoo  USE AS BODY WASH FOR BACK IN SHOWER QD    . ketoconazole (NIZORAL) 200 MG tablet ketoconazole 200 mg tablet  TAKE ONE TABLET EVERY MONDAY AND THURSDAY X8 WEEKS. AVOID ALCOHOL WHILE ON MEDICATION  AVOID ALCOHOL WHILE ON MEDICATION    . tazarotene (TAZORAC) 0.05 % cream Tazorac 0.05 % topical cream  APPLY A PEA-SIZED AMOUNT TO ENTIRE FACE QOHS X2 WEEKS AND THEN INCREASE TO QHS AS TOLERATED.     No current facility-administered medications on file prior to visit.     ALLERGIES: Minocycline hcl; Sulfa antibiotics; and Minocycline  Family History  Problem Relation Age of Onset  . Diabetes Mother   . Hypertension Mother   . Heart disease Mother  cardiomegaly  . Lupus Mother   . Diabetes Father   . Hypertension Father   . Stroke Father   . Hypertension Maternal Grandmother   . Stroke Maternal Grandmother   . Hypertension Sister   . Seizures Sister   . COPD Sister     SH:  Married, non smoker  Review of Systems  Genitourinary: Positive for urgency.  All other systems reviewed and are negative.   PHYSICAL EXAMINATION:    BP 122/72 (BP Location: Left Arm, Patient Position: Sitting, Cuff Size: Large)   Pulse 84   Resp 16   Ht 5\' 4"  (1.626 m)   Wt 206 lb 6.4 oz (93.6 kg)   LMP 04/08/2018   BMI 35.43 kg/m     General appearance: alert, cooperative and appears stated age Lymph:  no inguinal LAD noted  Pelvic: External  genitalia:  no lesions              Urethra:  normal appearing urethra with no masses, tenderness or lesions              Bartholins and Skenes: normal                 Vagina: normal appearing vagina with whitish discharge that has some mild odor              Cervix: no lesions              Chaperone was present for exam.  Assessment: Urinary urgency Vaginal discharge  Plan: macrobid 100mg  bid x 5 day Urine culture pending Affirm pending.  Results will be called to pt.

## 2018-04-29 LAB — VAGINITIS/VAGINOSIS, DNA PROBE
Candida Species: NEGATIVE
Gardnerella vaginalis: NEGATIVE
Trichomonas vaginosis: NEGATIVE

## 2018-04-30 LAB — URINE CULTURE

## 2018-05-17 DIAGNOSIS — Z923 Personal history of irradiation: Secondary | ICD-10-CM

## 2018-05-17 DIAGNOSIS — C50919 Malignant neoplasm of unspecified site of unspecified female breast: Secondary | ICD-10-CM

## 2018-05-17 HISTORY — PX: ESOPHAGOGASTRODUODENOSCOPY: SHX1529

## 2018-05-17 HISTORY — DX: Malignant neoplasm of unspecified site of unspecified female breast: C50.919

## 2018-05-17 HISTORY — DX: Personal history of irradiation: Z92.3

## 2018-06-19 ENCOUNTER — Other Ambulatory Visit: Payer: Self-pay

## 2018-06-19 ENCOUNTER — Emergency Department (HOSPITAL_BASED_OUTPATIENT_CLINIC_OR_DEPARTMENT_OTHER)
Admission: EM | Admit: 2018-06-19 | Discharge: 2018-06-19 | Disposition: A | Discharge status: Home / Self Care | Payer: BLUE CROSS/BLUE SHIELD | Attending: Emergency Medicine | Admitting: Emergency Medicine

## 2018-06-19 ENCOUNTER — Encounter (HOSPITAL_BASED_OUTPATIENT_CLINIC_OR_DEPARTMENT_OTHER): Payer: Self-pay | Admitting: Emergency Medicine

## 2018-06-19 DIAGNOSIS — Z79899 Other long term (current) drug therapy: Secondary | ICD-10-CM | POA: Insufficient documentation

## 2018-06-19 DIAGNOSIS — I10 Essential (primary) hypertension: Secondary | ICD-10-CM | POA: Insufficient documentation

## 2018-06-19 DIAGNOSIS — M545 Low back pain: Secondary | ICD-10-CM | POA: Diagnosis present

## 2018-06-19 DIAGNOSIS — M5416 Radiculopathy, lumbar region: Secondary | ICD-10-CM | POA: Insufficient documentation

## 2018-06-19 MED ORDER — HYDROCODONE-ACETAMINOPHEN 5-325 MG PO TABS: 1.0000 | ORAL_TABLET | Freq: Four times a day (QID) | ORAL | 0 refills | Status: DC | PRN

## 2018-06-19 MED ORDER — HYDROCODONE-ACETAMINOPHEN 5-325 MG PO TABS
1.0000 | ORAL_TABLET | Freq: Once | ORAL | Status: AC
  Administered 2018-06-19: 1 via ORAL
  Filled 2018-06-19: qty 1

## 2018-06-19 MED ORDER — NAPROXEN 250 MG PO TABS
500.0000 mg | ORAL_TABLET | Freq: Once | ORAL | Status: AC
  Administered 2018-06-19: 500 mg via ORAL
  Filled 2018-06-19: qty 2

## 2018-06-19 NOTE — ED Triage Notes (Addendum)
Pt reports L lower/sacral back pain starting yesterday prior to super bowl party, reports pain radiates to L leg. No meds PTA. Reports no injury/trauma that she can recall, reports pain progressively worse throughout the night

## 2018-06-19 NOTE — ED Provider Notes (Signed)
[facility] [facility] Provider Note: Hannah Spurling, MD, FACEP  CSN: [id number] MRN: [id number] ARRIVAL: 06/19/18 at [place]: [name]  06/19/18 2:14 AM [name] is a 53 y.o. female who had the gradual onset of left-sided low back pain yesterday.  It acutely worsened this morning while she was sleeping.  The pain is sharp.  She rates it as a 10 out of 10.  Radiates to the left leg and about the L2 or L3 dermatome.  It is worse with movement at the left hip.  She denies acute injury but was moving furniture a week ago.  She denies associated numbness or weakness.  She denies associated bowel or bladder changes.   Past Medical History:  Diagnosis Date  . Abnormal Pap smear 7/13   ASCUS/positive HR HPV, negative colpo  . Anemia 2006  . 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 Fibroids    with enlarged uterus  . Hypertension   . Pulmonary embolism ([place]) 2008   secondary to [facility]    Past Surgical History:  Procedure Laterality Date  . IR GENERIC HISTORICAL  03/31/2016   IR RADIOLOGIST EVAL & MGMT 03/31/2016 Marybelle Killings, MD [facility] INTERV RAD  . IR GENERIC HISTORICAL  01/06/2016   IR RADIOLOGIST EVAL & MGMT 01/06/2016 Marybelle Killings, MD [facility] INTERV RAD  . left finger reattachment  age 89  . LEFT OOPHORECTOMY Left 1998      . MYOMECTOMY  2003  . MYOMECTOMY  2008   via laparoscopy    Family History  Problem Relation Age of Onset  . Diabetes Mother   . Hypertension Mother   . Heart disease Mother        cardiomegaly  . Lupus Mother   . Diabetes Father   . Hypertension Father   . Stroke Father   . Hypertension Maternal Grandmother   . Stroke Maternal Grandmother   . Hypertension Sister   . Seizures Sister   . COPD Sister     Social History   Tobacco Use  . Smoking status: Never Smoker  . Smokeless tobacco: Never Used  Substance Use Topics  . Alcohol use: Yes    Alcohol/week: 0.0 - 1.0 standard  drinks  . Drug use: No    Prior to Admission medications   Medication Sig Start Date End Date Taking? Authorizing Provider  amLODipine-valsartan (EXFORGE) 10-320 MG tablet Take 1 tablet by mouth daily.  05/20/16   [provider]  Cholecalciferol (VITAMIN D3) 2000 units capsule  06/18/16   [provider]  Dexlansoprazole (DEXILANT PO) Take 1 tablet by mouth every morning.     [provider]  ferrous sulfate 325 (65 FE) MG tablet  12/02/16   [provider]  hydrochlorothiazide (HYDRODIURIL) 25 MG tablet Take 25 mg by mouth daily.  06/18/16   [provider]  ketoconazole (NIZORAL) 2 % cream ketoconazole 2 % topical cream  APPLY TO THE BACK AND SHOULDERS BY TOPICAL ROUTE QHS    [provider]  ketoconazole (NIZORAL) 2 % shampoo ketoconazole 2 % shampoo  USE AS BODY [place] FOR BACK IN SHOWER QD    [provider]  ketoconazole (NIZORAL) 200 MG tablet ketoconazole 200 mg tablet  TAKE ONE TABLET EVERY MONDAY AND THURSDAY X8 WEEKS. AVOID ALCOHOL WHILE ON MEDICATION  AVOID ALCOHOL WHILE ON MEDICATION    [provider]  nitrofurantoin, macrocrystal-monohydrate, (MACROBID) 100 MG capsule  Take 1 capsule (100 mg total) by mouth 2 (two) times daily. 04/28/18   Megan Salon, MD  Probiotic Product (PROBIOTIC-10) CAPS Take by mouth daily as needed.    [provider]  SPIRULINA PO Take by mouth daily.    [provider]  tazarotene (TAZORAC) 0.05 % cream Tazorac 0.05 % topical cream  APPLY A PEA-SIZED AMOUNT TO ENTIRE FACE QOHS X2 WEEKS AND THEN INCREASE TO QHS AS TOLERATED.    [provider]    Allergies Minocycline hcl and Sulfa antibiotics   REVIEW OF SYSTEMS  Negative except as noted here or in the History of Present Illness.   PHYSICAL EXAMINATION  Initial Vital Signs Blood pressure 120/77, pulse 88, temperature 98 F (36.7 C), resp. rate 18, height 5\' 5"  (1.651 m), weight 89.8 kg, last  menstrual period 06/12/2018, SpO2 100 %.  Examination General: Well-developed, well-nourished female in no acute distress; appearance consistent with age of record HENT: normocephalic; atraumatic Eyes: pupils equal, round and reactive to light; extraocular muscles intact Neck: supple Heart: regular rate and rhythm Lungs: clear to auscultation bilaterally Abdomen: soft; nondistended; nontender; bowel sounds present Back: Nontender; positive straight leg raise on the left at 45 degrees Extremities: No deformity; full range of motion except left hip; pulses normal Neurologic: Awake, alert and oriented; motor function intact in all extremities and symmetric; sensation intact and symmetric in the lower extremities; no facial droop Skin: Warm and dry Psychiatric: Flat affect   RESULTS  Summary of this visit's results, reviewed by myself:   EKG Interpretation  Date/Time:    Ventricular Rate:    PR Interval:    QRS Duration:   QT Interval:    QTC Calculation:   R Axis:     Text Interpretation:        Laboratory Studies: No results found for this or any previous visit (from the past 24 hour(s)). Imaging Studies: No results found.  ED COURSE and MDM  Nursing notes and initial vitals signs, including pulse oximetry, reviewed.  Vitals:   06/19/18 0211  BP: 120/77  Pulse: 88  Resp: 18  Temp: 98 F (36.7 C)  SpO2: 100%  Weight: 89.8 kg  Height: 5\' 5"  (1.651 m)   Symptoms consistent with lumbar radiculopathy.  PROCEDURES    ED DIAGNOSES     ICD-10-CM   1. Acute left lumbar radiculopathy M54.16        [name], Jenny Reichmann, MD 06/19/18 0222

## 2018-09-18 ENCOUNTER — Ambulatory Visit: Payer: BLUE CROSS/BLUE SHIELD | Admitting: Hematology & Oncology

## 2018-09-18 ENCOUNTER — Other Ambulatory Visit: Payer: BLUE CROSS/BLUE SHIELD

## 2018-10-30 ENCOUNTER — Encounter: Payer: Self-pay | Admitting: Hematology & Oncology

## 2018-10-30 ENCOUNTER — Inpatient Hospital Stay (HOSPITAL_BASED_OUTPATIENT_CLINIC_OR_DEPARTMENT_OTHER): Payer: BC Managed Care – PPO | Admitting: Hematology & Oncology

## 2018-10-30 ENCOUNTER — Other Ambulatory Visit: Payer: Self-pay

## 2018-10-30 ENCOUNTER — Inpatient Hospital Stay: Payer: BC Managed Care – PPO | Attending: Hematology & Oncology

## 2018-10-30 VITALS — BP 131/72 | HR 73 | Temp 98.5°F | Resp 18 | Wt 205.0 lb

## 2018-10-30 DIAGNOSIS — D500 Iron deficiency anemia secondary to blood loss (chronic): Secondary | ICD-10-CM | POA: Insufficient documentation

## 2018-10-30 DIAGNOSIS — N921 Excessive and frequent menstruation with irregular cycle: Secondary | ICD-10-CM | POA: Insufficient documentation

## 2018-10-30 DIAGNOSIS — Z79899 Other long term (current) drug therapy: Secondary | ICD-10-CM | POA: Diagnosis not present

## 2018-10-30 LAB — CMP (CANCER CENTER ONLY)
ALT: 17 U/L (ref 0–44)
AST: 15 U/L (ref 15–41)
Albumin: 3.8 g/dL (ref 3.5–5.0)
Alkaline Phosphatase: 65 U/L (ref 38–126)
Anion gap: 6 (ref 5–15)
BUN: 10 mg/dL (ref 6–20)
CO2: 27 mmol/L (ref 22–32)
Calcium: 9.3 mg/dL (ref 8.9–10.3)
Chloride: 102 mmol/L (ref 98–111)
Creatinine: 0.68 mg/dL (ref 0.44–1.00)
GFR, Est AFR Am: >60 mL/min (ref >60)
GFR, Estimated: >60 mL/min (ref >60)
Glucose, Bld: 106 mg/dL — ABNORMAL HIGH (ref 70–99)
Potassium: 3.8 mmol/L (ref 3.5–5.1)
Sodium: 135 mmol/L (ref 135–145)
Total Bilirubin: 0.3 mg/dL (ref 0.3–1.2)
Total Protein: 7.9 g/dL (ref 6.5–8.1)

## 2018-10-30 LAB — CBC WITH DIFFERENTIAL (CANCER CENTER ONLY)
Abs Immature Granulocytes: 0.15 10*3/uL — ABNORMAL HIGH (ref 0.00–0.07)
Basophils Absolute: 0 10*3/uL (ref 0.0–0.1)
Basophils Relative: 0 %
Eosinophils Absolute: 0.2 10*3/uL (ref 0.0–0.5)
Eosinophils Relative: 3 %
HCT: 35.6 % — ABNORMAL LOW (ref 36.0–46.0)
Hemoglobin: 11.3 g/dL — ABNORMAL LOW (ref 12.0–15.0)
Immature Granulocytes: 2 %
Lymphocytes Relative: 31 %
Lymphs Abs: 2.1 10*3/uL (ref 0.7–4.0)
MCH: 28.6 pg (ref 26.0–34.0)
MCHC: 31.7 g/dL (ref 30.0–36.0)
MCV: 90.1 fL (ref 80.0–100.0)
Monocytes Absolute: 0.5 10*3/uL (ref 0.1–1.0)
Monocytes Relative: 8 %
Neutro Abs: 3.8 10*3/uL (ref 1.7–7.7)
Neutrophils Relative %: 56 %
Platelet Count: 275 10*3/uL (ref 150–400)
RBC: 3.95 MIL/uL (ref 3.87–5.11)
RDW: 13 % (ref 11.5–15.5)
WBC Count: 6.8 10*3/uL (ref 4.0–10.5)
nRBC: 0 % (ref 0.0–0.2)

## 2018-10-30 LAB — FERRITIN: Ferritin: 26 ng/mL (ref 11–307)

## 2018-10-30 LAB — IRON AND TIBC
Iron: 55 ug/dL (ref 41–142)
Saturation Ratios: 21 % (ref 21–57)
TIBC: 264 ug/dL (ref 236–444)
UIBC: 208 ug/dL (ref 120–384)

## 2018-10-30 NOTE — Progress Notes (Signed)
Hematology and Oncology Follow Up Visit  [name] [id number] 1966/02/06 53 y.o. 10/30/2018   Principle Diagnosis:   Iron deficiency anemia secondary to menometrorrhagia  Current Therapy:    Oral iron     Interim History:  [name] is back for follow-up.  She is managing the coronavirus.  She works from home.  Her husband actually goes to work.  Thankfully, she made it to [facility], [place] over Christmas.  She had a wonderful time.  She says it was a [place] colder than she would have thought.  She still has her cycles.  They are not as heavy.  She is on oral iron.  She seems to be doing pretty well with oral iron.  Her last iron studies done back in November showed a ferritin of 21 iron saturation of 20%.  We have not yet had to give her any IV iron.  She has had no fever.  There is been no cough.  She has had no shortness of breath.  Overall, her performance status is ECOG 0.  Medications:  Current Outpatient Medications:  .  amLODipine-valsartan (EXFORGE) 10-320 MG tablet, Take 1 tablet by mouth daily. , Disp: , Rfl:  .  Cholecalciferol (VITAMIN D3) 2000 units capsule, , Disp: , Rfl:  .  Dexlansoprazole (DEXILANT PO), Take 1 tablet by mouth every morning. , Disp: , Rfl:  .  ferrous sulfate 325 (65 FE) MG tablet, , Disp: , Rfl:  .  hydrochlorothiazide (HYDRODIURIL) 25 MG tablet, Take 25 mg by mouth daily. , Disp: , Rfl:  .  HYDROcodone-acetaminophen (NORCO) 5-325 MG tablet, Take 1 tablet by mouth every 6 (six) hours as needed for severe pain., Disp: 20 tablet, Rfl: 0 .  ketoconazole (NIZORAL) 2 % cream, ketoconazole 2 % topical cream  APPLY TO THE BACK AND SHOULDERS BY TOPICAL ROUTE QHS, Disp: , Rfl:  .  ketoconazole (NIZORAL) 2 % shampoo, ketoconazole 2 % shampoo  USE AS BODY WASH FOR BACK IN SHOWER QD, Disp: , Rfl:  .  ketoconazole (NIZORAL) 200 MG tablet, ketoconazole 200 mg tablet  TAKE ONE TABLET EVERY MONDAY AND THURSDAY X8 WEEKS. AVOID ALCOHOL WHILE  ON MEDICATION  AVOID ALCOHOL WHILE ON MEDICATION, Disp: , Rfl:  .  Probiotic Product (PROBIOTIC-10) CAPS, Take by mouth daily as needed., Disp: , Rfl:  .  SPIRULINA PO, Take by mouth daily., Disp: , Rfl:  .  tazarotene (TAZORAC) 0.05 % cream, Tazorac 0.05 % topical cream  APPLY A PEA-SIZED AMOUNT TO ENTIRE FACE QOHS X2 WEEKS AND THEN INCREASE TO QHS AS TOLERATED., Disp: , Rfl:   Allergies:  Allergies  Allergen Reactions  . Minocycline Hcl Hives and Itching  . Sulfa Antibiotics Swelling    Swell of the lips     Past Medical History, Surgical history, Social history, and Family History were reviewed and updated.  Review of Systems: Review of Systems  Constitutional: Negative.   HENT:  Negative.   Eyes: Negative.   Respiratory: Negative.   Cardiovascular: Negative.   Gastrointestinal: Negative.   Endocrine: Negative.   Genitourinary: Negative.    Musculoskeletal: Negative.   Skin: Negative.   Neurological: Negative.   Hematological: Negative.   Psychiatric/Behavioral: Negative.     Physical Exam:  weight is 205 lb (93 kg). Her oral temperature is 98.5 F (36.9 C). Her blood pressure is 131/72 and her pulse is 73. Her respiration is 18 and oxygen saturation is 99%.   Wt Readings from Last 3 Encounters:  10/30/18 205 lb (93 kg)  06/19/18 198 lb (89.8 kg)  04/28/18 206 lb 6.4 oz (93.6 kg)    Physical Exam Vitals signs reviewed.  HENT:     Head: Normocephalic and atraumatic.  Eyes:     Pupils: Pupils are equal, round, and reactive to light.  Neck:     Musculoskeletal: Normal range of motion.  Cardiovascular:     Rate and Rhythm: Normal rate and regular rhythm.     Heart sounds: Normal heart sounds.  Pulmonary:     Effort: Pulmonary effort is normal.     Breath sounds: Normal breath sounds.  Abdominal:     General: Bowel sounds are normal.     Palpations: Abdomen is soft.  Musculoskeletal: Normal range of motion.        General: No tenderness or deformity.   Lymphadenopathy:     Cervical: No cervical adenopathy.  Skin:    General: Skin is warm and dry.     Findings: No erythema or rash.  Neurological:     Mental Status: She is alert and oriented to person, place, and time.  Psychiatric:        Behavior: Behavior normal.        Thought Content: Thought content normal.        Judgment: Judgment normal.      Lab Results  Component Value Date   WBC 6.8 10/30/2018   HGB 11.3 (L) 10/30/2018   HCT 35.6 (L) 10/30/2018   MCV 90.1 10/30/2018   PLT 275 10/30/2018     Chemistry      Component Value Date/Time   NA 136 03/20/2018 0759   NA 142 04/12/2017 0858   K 4.2 03/20/2018 0759   K 3.8 04/12/2017 0858   CL 105 03/20/2018 0759   CL 104 04/12/2017 0858   CO2 27 03/20/2018 0759   CO2 26 04/12/2017 0858   BUN 12 03/20/2018 0759   BUN 9 04/12/2017 0858   CREATININE 1.00 03/20/2018 0759   CREATININE 0.8 04/12/2017 0858      Component Value Date/Time   CALCIUM 9.5 03/20/2018 0759   CALCIUM 9.2 04/12/2017 0858   ALKPHOS 63 03/20/2018 0759   ALKPHOS 74 04/12/2017 0858   AST 21 03/20/2018 0759   ALT 24 03/20/2018 0759   ALT 26 04/12/2017 0858   BILITOT 0.6 03/20/2018 0759         Impression and Plan: [name] is a 53 year old African-American female.  She has menometrorrhagia.  She has mild iron deficiency.  Her hemoglobin is holding steady compared to when we last saw her.  We will see what her iron studies show.  I think we can get her back in 6 months.  I do not see any problems with her coming back in 6 months.  If she has any issues before then, we will be more than happy to see her.  It was a lot of fun talking with her.   Hannah Napoleon, MD 6/15/20208:19 AM

## 2019-02-12 ENCOUNTER — Other Ambulatory Visit (HOSPITAL_BASED_OUTPATIENT_CLINIC_OR_DEPARTMENT_OTHER): Payer: Self-pay | Admitting: Obstetrics & Gynecology

## 2019-02-12 DIAGNOSIS — Z1231 Encounter for screening mammogram for malignant neoplasm of breast: Secondary | ICD-10-CM

## 2019-02-13 ENCOUNTER — Other Ambulatory Visit: Payer: Self-pay

## 2019-02-13 ENCOUNTER — Ambulatory Visit (HOSPITAL_BASED_OUTPATIENT_CLINIC_OR_DEPARTMENT_OTHER)
Admission: RE | Admit: 2019-02-13 | Discharge: 2019-02-13 | Disposition: A | Discharge status: Home / Self Care | Payer: BC Managed Care – PPO | Source: Ambulatory Visit | Attending: Obstetrics & Gynecology | Admitting: Obstetrics & Gynecology

## 2019-02-13 DIAGNOSIS — Z1231 Encounter for screening mammogram for malignant neoplasm of breast: Secondary | ICD-10-CM

## 2019-02-14 ENCOUNTER — Other Ambulatory Visit: Payer: Self-pay | Admitting: Obstetrics & Gynecology

## 2019-02-14 DIAGNOSIS — R928 Other abnormal and inconclusive findings on diagnostic imaging of breast: Secondary | ICD-10-CM

## 2019-02-16 ENCOUNTER — Other Ambulatory Visit: Payer: Self-pay | Admitting: Obstetrics & Gynecology

## 2019-02-16 ENCOUNTER — Ambulatory Visit
Admission: RE | Admit: 2019-02-16 | Discharge: 2019-02-16 | Disposition: A | Discharge status: Home / Self Care | Payer: BC Managed Care – PPO | Source: Ambulatory Visit | Attending: Obstetrics & Gynecology | Admitting: Obstetrics & Gynecology

## 2019-02-16 ENCOUNTER — Other Ambulatory Visit: Payer: Self-pay

## 2019-02-16 DIAGNOSIS — R599 Enlarged lymph nodes, unspecified: Secondary | ICD-10-CM

## 2019-02-16 DIAGNOSIS — R928 Other abnormal and inconclusive findings on diagnostic imaging of breast: Secondary | ICD-10-CM

## 2019-02-16 DIAGNOSIS — R921 Mammographic calcification found on diagnostic imaging of breast: Secondary | ICD-10-CM

## 2019-02-21 ENCOUNTER — Ambulatory Visit
Admission: RE | Admit: 2019-02-21 | Discharge: 2019-02-21 | Disposition: A | Discharge status: Home / Self Care | Payer: BC Managed Care – PPO | Source: Ambulatory Visit | Attending: Obstetrics & Gynecology | Admitting: Obstetrics & Gynecology

## 2019-02-21 ENCOUNTER — Other Ambulatory Visit: Payer: Self-pay

## 2019-02-21 ENCOUNTER — Other Ambulatory Visit: Payer: Self-pay | Admitting: Obstetrics & Gynecology

## 2019-02-21 DIAGNOSIS — R599 Enlarged lymph nodes, unspecified: Secondary | ICD-10-CM

## 2019-02-21 DIAGNOSIS — R921 Mammographic calcification found on diagnostic imaging of breast: Secondary | ICD-10-CM

## 2019-02-21 DIAGNOSIS — C50212 Malignant neoplasm of upper-inner quadrant of left female breast: Secondary | ICD-10-CM | POA: Insufficient documentation

## 2019-02-22 ENCOUNTER — Ambulatory Visit (INDEPENDENT_AMBULATORY_CARE_PROVIDER_SITE_OTHER): Payer: BC Managed Care – PPO | Admitting: Obstetrics & Gynecology

## 2019-02-22 ENCOUNTER — Telehealth: Payer: Self-pay | Admitting: Obstetrics & Gynecology

## 2019-02-22 DIAGNOSIS — D0512 Intraductal carcinoma in situ of left breast: Secondary | ICD-10-CM | POA: Diagnosis not present

## 2019-02-22 NOTE — Telephone Encounter (Signed)
Patient would like to speak with nurse about breast biopsy results.

## 2019-02-22 NOTE — Telephone Encounter (Signed)
Spoke with patient, states she will have to return call to office, she is in a meeting.

## 2019-02-22 NOTE — Progress Notes (Signed)
Virtual Visit via Video Note  I connected with Analiz Essary Hoggard on 02/22/19 at  4:30 PM EDT by a video enabled telemedicine application and verified that I am speaking with the correct person using two identifiers.  Location: Patient: home Provider: office   I discussed the limitations of evaluation and management by telemedicine and the availability of in person appointments. The patient expressed understanding and agreed to proceed.  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  She does have appt with Dr. Ninfa Linden at [place] next week.  Pt is aware of biopsy results and pending hormonal receptor status testing.  Typical treatment of lumpectomy with radiation, +/- post treatment therapy with tamoxifen discussed.  As well, simple mastectomy discussed.  She is currently feeling like this is too much surgery for her current diagnosis.  Pt aware chemotherapy is not used in this situation.  Questions about surgery, radiation, and post op treatment (if needed) all discussed.    Options for surgical care discussed.  She is going to keep appt and will get second opinion if feels this is necessary after initial consultation.  Possible need for oncology discussed as well but this will depend on hormonal status of the DCIS as well as final pathology.     Observations/Objective: WNWD AAF NAD  Assessment and Plan: DCIS of left breast with surgical consultation   Follow Up Instructions: She knows to call with any additional questions or concerns.  She knows is aware this could be a video visit or in face visit as is needed.  I provided 30 minutes of non-face-to-face time during this encounter.   Megan Salon, MD

## 2019-02-22 NOTE — Telephone Encounter (Signed)
Spoke with patient. Patient has been notified of left breast biopsy results by nurse navigator at [facility]. Patient is scheduled for consult at [facility] with Dr. Rush Farmer on 03/02/19. Reviewed breast cancer clinic with patient and how process works. Patient request to talk with Dr. Sabra Heck directly to discuss results and options for second opinion. Recommended WebEx or OV, patient request to proceed with WebEx. WebEx scheduled for today at 4:30pm. E-mail address on file confirmed, reviewed process. Patient verbalizes understanding.   Routing to provider for final review. Patient is agreeable to disposition. Will close encounter.

## 2019-02-23 ENCOUNTER — Telehealth: Payer: Self-pay | Admitting: Obstetrics & Gynecology

## 2019-02-23 NOTE — Telephone Encounter (Signed)
Patient is calling regarding getting a consultation scheduled with Dr. Donne Hazel at [facility]. His first available is October 29th and the patient stated "I can't wait that long."

## 2019-02-23 NOTE — Telephone Encounter (Signed)
Spoke with patient. Patient has not scheduled with Dr. Donne Hazel, still has appt with Dr. Rush Farmer next week. Patient is going to keep appt as scheduled, patient will f/u with [facility] to see if she can also schedule with Dr. Donne Hazel. Questions answered, patient is aware to call if any additional assistance is needed.   Routing to provider for final review. Patient is agreeable to disposition. Will close encounter.

## 2019-02-28 ENCOUNTER — Encounter: Payer: Self-pay | Admitting: Adult Health

## 2019-02-28 ENCOUNTER — Encounter: Payer: Self-pay | Admitting: *Deleted

## 2019-02-28 DIAGNOSIS — D0512 Intraductal carcinoma in situ of left breast: Secondary | ICD-10-CM

## 2019-02-28 NOTE — Progress Notes (Signed)
Per discussion at [employer], patient will need referral to Radiation Oncology. She is already scheduled to see surgeon 03/01/19. She will see Dr Marin Olp in December to discuss hormone therapy.   Referral placed. Will follow to see when she is scheduled for surgery and initiation of radiation.

## 2019-03-01 ENCOUNTER — Encounter: Payer: Self-pay | Admitting: Obstetrics & Gynecology

## 2019-03-02 ENCOUNTER — Other Ambulatory Visit: Payer: Self-pay | Admitting: Surgery

## 2019-03-02 DIAGNOSIS — D0512 Intraductal carcinoma in situ of left breast: Secondary | ICD-10-CM

## 2019-03-05 ENCOUNTER — Other Ambulatory Visit: Payer: Self-pay | Admitting: Surgery

## 2019-03-05 DIAGNOSIS — D0512 Intraductal carcinoma in situ of left breast: Secondary | ICD-10-CM

## 2019-03-07 ENCOUNTER — Encounter: Payer: Self-pay | Admitting: *Deleted

## 2019-03-07 ENCOUNTER — Other Ambulatory Visit: Payer: Self-pay | Admitting: Genetic Counselor

## 2019-03-07 DIAGNOSIS — D0512 Intraductal carcinoma in situ of left breast: Secondary | ICD-10-CM

## 2019-03-07 DIAGNOSIS — C50212 Malignant neoplasm of upper-inner quadrant of left female breast: Secondary | ICD-10-CM

## 2019-03-07 DIAGNOSIS — Z170 Estrogen receptor positive status [ER+]: Secondary | ICD-10-CM

## 2019-03-07 NOTE — Progress Notes (Signed)
Patient is reaching out because she would like a referral to genetics prior to her planned lumpectomy. She wasn't not initially identified as needed genetics during [place] but she would like to know whether or not she has the BRCA gene before surgery.  She is currently scheduled to see radiation oncology for new patient consult tomorrow. I have reached out to genetics to see if she can get her blood drawn while she is in the office, and genetics can follow up with a virtual appointment at a later time.   Appointment made for patient to have blood drawn tomorrow after her appointments. Informed her that she may not qualify for insurance coverage for testing, but that her out of pocket max would be $250. She wishes to proceed with testing.

## 2019-03-07 NOTE — Progress Notes (Signed)
Location of Breast Cancer: DCIS of left breast   Histology per Pathology Report: 02/21/19: Diagnosis 1. Lymph node, needle/core biopsy, left axilla - REACTIVE LYMPH NODE. - NO METASTATIC CARCINOMA. 2. Breast, left, needle core biopsy, upper inner calcs - DUCTAL CARCINOMA IN SITU WITH CALCIFICATIONS. - SEE MICROSCOPIC DESCRIPTION   Receptor Status: ER(100%), PR (100%)  Did patient present with symptoms (if so, please note symptoms) or was this found on screening mammography?: She was recently found to have calcifications in the upper inner left breast on screening mammography. She underwent a stereotactic biopsy of this area as well as reactive lymph nodes in her axilla.   Past/Anticipated interventions by surgeon, if any: 03/20/19: LEFT BREAST LUMPECTOMY WITH RADIOACTIVE SEED LOCALIZATION Coralie Keens, MD  Past/Anticipated interventions by medical oncology, if any: Chemotherapy None at this time.  Lymphedema issues, if any: Pt has not had surgery at this time.  Pain issues, if any: Pt denies c/o pain.  SAFETY ISSUES:  Prior radiation? No  Pacemaker/ICD? No  Possible current pregnancy? No  Is the patient on methotrexate? No  Current Complaints / other details:  Pt presents today for initial consult with Dr. Sondra Come for Radiation Oncology.  BP 124/80   Pulse 78   Temp 98.5 F (36.9 C) (Temporal)   Resp 18   Ht 5\' 5"  (1.651 m)   Wt 198 lb 6.4 oz (90 kg)   SpO2 100%   BMI 33.02 kg/m   Wt Readings from Last 3 Encounters:  03/08/19 198 lb 6.4 oz (90 kg)  10/30/18 205 lb (93 kg)  06/19/18 198 lb (89.8 kg)      Loma Sousa, RN 03/08/2019,2:25 PM

## 2019-03-08 ENCOUNTER — Telehealth: Payer: Self-pay | Admitting: Hematology & Oncology

## 2019-03-08 ENCOUNTER — Ambulatory Visit
Admission: RE | Admit: 2019-03-08 | Discharge: 2019-03-08 | Disposition: A | Discharge status: Home / Self Care | Payer: BC Managed Care – PPO | Source: Ambulatory Visit | Attending: Radiation Oncology | Admitting: Radiation Oncology

## 2019-03-08 ENCOUNTER — Encounter: Payer: Self-pay | Admitting: Licensed Clinical Social Worker

## 2019-03-08 ENCOUNTER — Inpatient Hospital Stay: Payer: BC Managed Care – PPO | Attending: Genetic Counselor

## 2019-03-08 ENCOUNTER — Ambulatory Visit (HOSPITAL_BASED_OUTPATIENT_CLINIC_OR_DEPARTMENT_OTHER): Payer: BC Managed Care – PPO | Admitting: Licensed Clinical Social Worker

## 2019-03-08 ENCOUNTER — Encounter: Payer: Self-pay | Admitting: Radiation Oncology

## 2019-03-08 ENCOUNTER — Other Ambulatory Visit: Payer: Self-pay

## 2019-03-08 VITALS — BP 124/80 | HR 78 | Temp 98.5°F | Resp 18 | Ht 65.0 in | Wt 198.4 lb

## 2019-03-08 DIAGNOSIS — Z170 Estrogen receptor positive status [ER+]: Secondary | ICD-10-CM | POA: Insufficient documentation

## 2019-03-08 DIAGNOSIS — R590 Localized enlarged lymph nodes: Secondary | ICD-10-CM | POA: Insufficient documentation

## 2019-03-08 DIAGNOSIS — C50212 Malignant neoplasm of upper-inner quadrant of left female breast: Secondary | ICD-10-CM

## 2019-03-08 DIAGNOSIS — Z809 Family history of malignant neoplasm, unspecified: Secondary | ICD-10-CM | POA: Insufficient documentation

## 2019-03-08 DIAGNOSIS — N920 Excessive and frequent menstruation with regular cycle: Secondary | ICD-10-CM | POA: Insufficient documentation

## 2019-03-08 DIAGNOSIS — I10 Essential (primary) hypertension: Secondary | ICD-10-CM | POA: Diagnosis not present

## 2019-03-08 DIAGNOSIS — D500 Iron deficiency anemia secondary to blood loss (chronic): Secondary | ICD-10-CM | POA: Diagnosis not present

## 2019-03-08 DIAGNOSIS — D0512 Intraductal carcinoma in situ of left breast: Secondary | ICD-10-CM | POA: Diagnosis present

## 2019-03-08 DIAGNOSIS — Z1379 Encounter for other screening for genetic and chromosomal anomalies: Secondary | ICD-10-CM | POA: Diagnosis not present

## 2019-03-08 DIAGNOSIS — Z79899 Other long term (current) drug therapy: Secondary | ICD-10-CM | POA: Insufficient documentation

## 2019-03-08 DIAGNOSIS — Z87891 Personal history of nicotine dependence: Secondary | ICD-10-CM | POA: Diagnosis not present

## 2019-03-08 NOTE — Telephone Encounter (Signed)
Spoke with patient to confirm genetics counseling appt 10/27 at 0900 per 10/22 staff msg

## 2019-03-08 NOTE — Progress Notes (Signed)
REFERRING PROVIDER: [name], MD [address] [place],  [place] [address]  PRIMARY PROVIDER:  Elwyn Reach, MD  PRIMARY REASON FOR VISIT:  1. Malignant neoplasm of upper-inner quadrant of left breast in female, estrogen receptor positive ([place])   2. Family history of cancer      HISTORY OF PRESENT ILLNESS:   Ms. Deming, a 53 y.o. female, was seen for a [place] cancer genetics consultation at the request of Dr. Marin Olp due to her recent diagnosis of DCIS.  Ms. Delmore presents to clinic today to discuss the possibility of a hereditary predisposition to cancer, genetic testing, and to further clarify her future cancer risks, as well as potential cancer risks for family members.   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 The treatment plan includes lumpectomy and radiation. The patient says she would potentially change her surgical decision based on genetic testing.    CANCER HISTORY:  Oncology History  Malignant neoplasm of upper-inner quadrant of left breast in female, estrogen receptor positive ([place])  02/21/2019 Initial Diagnosis   Malignant neoplasm of upper-inner quadrant of left breast in female, estrogen receptor positive ([place])   02/21/2019 Cancer Staging   Staging form: Breast, AJCC 8th Edition - Clinical stage from 02/21/2019: Stage 0 (cTis (DCIS), cN0, cM0, ER+, PR+) - Signed by Gardenia Phlegm, NP on 02/28/2019      RISK FACTORS:  Menarche was at age 54.  First live birth at age no children.  OCP use for approximately 5 years.  Ovaries intact: she has 1 ovary.  Hysterectomy: no.  Menopausal status: premenopausal.  HRT use: 0 years. Colonoscopy: no; scheduled. Mammogram within the last year: yes. Number of breast biopsies: 1. Up to date with pelvic exams: yes. Any excessive radiation exposure in the past: no  Past Medical History:  Diagnosis Date  . Abnormal Pap smear 7/13   ASCUS/positive HR HPV, negative colpo  . Anemia 2006  . 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 Family history of cancer   . Fibroids    with enlarged uterus  . Hypertension   . Pulmonary embolism ([place]) 2008   secondary to [facility]    Past Surgical History:  Procedure Laterality Date  . IR GENERIC HISTORICAL  03/31/2016   IR RADIOLOGIST EVAL & MGMT 03/31/2016 Marybelle Killings, MD [facility] INTERV RAD  . IR GENERIC HISTORICAL  01/06/2016   IR RADIOLOGIST EVAL & MGMT 01/06/2016 Marybelle Killings, MD [facility] INTERV RAD  . left finger reattachment  age 63  . LEFT OOPHORECTOMY Left 1998      . MYOMECTOMY  2003  . MYOMECTOMY  2008   via laparoscopy    Social History   Socioeconomic History  . Marital status: Married    Spouse name: Not on file  . Number of children: 0  . Years of education: BA  . Highest education level: Not on file  Occupational History  . Occupation: Insurance account manager: [employer]  . Financial resource strain: Not on file  . Food insecurity    Worry: Not on file    Inability: Not on file  . Transportation needs    Medical: Not on file    Non-medical: Not on file  Tobacco Use  . Smoking status: Never Smoker  . Smokeless tobacco: Never Used  Substance and Sexual Activity  . Alcohol use: Yes    Alcohol/week:  0.0 - 1.0 standard drinks  . Drug use: No  . Sexual activity: Yes    Partners: Male    Birth control/protection: None  Lifestyle  . Physical activity    Days per week: Not on file    Minutes per session: Not on file  . Stress: Not on file  Relationships  . Social Herbalist on phone: Not on file    Gets together: Not on file    Attends religious service: Not on file    Active member of club or organization: Not on file    Attends meetings of clubs or organizations: Not on file    Relationship status: Not on file  Other Topics Concern  . Not on file  Social History Narrative   Patient is single with no children.   Patient is  right handed.   Patient has a BA degree.   Patient drinks 1 cup daily.     FAMILY HISTORY:  We obtained a detailed, 4-generation family history.  Significant diagnoses are listed below: Family History  Problem Relation Age of Onset  . Diabetes Mother   . Hypertension Mother   . Heart disease Mother        cardiomegaly  . Lupus Mother   . Diabetes Father   . Hypertension Father   . Stroke Father   . Hypertension Maternal Grandmother   . Stroke Maternal Grandmother   . Hypertension Sister   . Seizures Sister   . COPD Sister   . Cancer Maternal Aunt        unk type    Ms. Eisenhower does not have children. She has 3 maternal half sisters, no history of cancer.  Ms. Cowman mother died at 47, no history of cancer. Patient has 1 maternal half aunt, no cancer. No cancer in maternal cousins. No cancer for maternal grandparents.  Ms. Borden father died at 7, no cancer. Patient had 1 paternal aunt, 1 paternal uncle. Her aunt did have cancer, she says a female cancer, but is unsure exactly which type. No known cancers in paternal cousins. No cancers for paternal grandparents.  Ms. Suh is unaware of previous family history of genetic testing for hereditary cancer risks. There is no reported Ashkenazi Jewish ancestry. There is no known consanguinity.  GENETIC COUNSELING ASSESSMENT: Ms. Viar is a 53 y.o. female with a personal history which is not very suggestive of a hereditary cancer syndrome and predisposition to cancer, however the patient was very interested in genetic testing. We, therefore, discussed and recommended the following at today's visit.   DISCUSSION: We discussed that 5 - 10% of breast cancer is hereditary, with most cases associated with BRCA1/BRCA2 mutations.  There are other genes that can be associated with hereditary breast cancer syndromes.  We discussed that testing is beneficial for several reasons including surgical decision-making for breast cancer, knowing how  to follow individuals after completing their treatment, and understand if other family members could be at risk for cancer and allow them to undergo genetic testing.   We reviewed the characteristics, features and inheritance patterns of hereditary cancer syndromes. We also discussed genetic testing, including the appropriate family members to test, the process of testing, insurance coverage and turn-around-time for results. We discussed the implications of a negative, positive and/or variant of uncertain significant result. In order to get genetic test results in a timely manner so that Ms. Pounds can use these genetic test results for surgical decisions, we recommended Ms. Christner  pursue genetic testing for the STAT Breast Cancer Panel.  Once complete, she will have the option to pursue reflex to a larger panel, but right now just feels comfortable with the STAT Panel.   The STAT Breast cancer panel offered by [facility] includes sequencing and rearrangement analysis for the following 9 genes:  ATM, BRCA1, BRCA2, CDH1, CHEK2, PALB2, PTEN, STK11 and TP53.    Based on Ms. Feher's personal and family history of cancer, she does not meet medical criteria for genetic testing. She will pay the cash pay price of $250 offered by [facility] for this testing.   PLAN: After considering the risks, benefits, and limitations, Ms. Beattie provided informed consent to pursue genetic testing and the blood sample was sent to [facility] for analysis of the Breast Cancer STAT Panel. Results should be available within approximately 5-12 days' time, at which point they will be disclosed by telephone to Ms. Bumgarner, as will any additional recommendations warranted by these results. Ms. Hartsough will receive a summary of her genetic counseling visit and a copy of her results once available. This information will also be available in Epic.   Ms. Mcmurry questions were answered to her satisfaction today. Our contact information  was provided should additional questions or concerns arise. Thank you for the referral and allowing [place] to share in the care of your patient.   Faith Rogue, MS, [facility] Genetic Counselor [email or web address] Phone: [phone]  The patient was seen for a total of 30 minutes in face-to-face genetic counseling.  Drs. Magrinat, Lindi Adie and/or Burr Medico were available for discussion regarding this case.   _______________________________________________________________________ For Office Staff:  Number of people involved in session: 1 Was an Intern/ student involved with case: no

## 2019-03-08 NOTE — Progress Notes (Signed)
Radiation Oncology         [phone] ________________________________  Initial Outpatient Consultation  Name: [name] MRN: [id number]  Date: 03/08/2019  DOB: 07/10/1965  ZY:SAYTK, Henderson Newcomer, MD  [name], MD   REFERRING PHYSICIAN: [name], MD  DIAGNOSIS: The encounter diagnosis was Malignant neoplasm of upper-inner quadrant of left breast in female, estrogen receptor positive ([place]).  Stage 0 Left Breast UIQ, DCIS, ER+ / PR+, Grade 3  HISTORY OF PRESENT ILLNESS::[name] is a 53 y.o. female who is accompanied by no one due to COVID-19 restrictions. The patient has been followed by Dr. Marin Olp for iron deficiency anemia secondary to menometrorrhagia. She had routine screening mammography on 02/13/2019 showing a possible abnormality in the right axilla and the left breast. She underwent left diagnostic mammography and bilateral breast ultrasonography on 02/16/2019 showing: suspicious calcifications involving the upper-inner quadrant of the left breast with a maximum span of 1.3 cm; multiple indeterminate though likely reactive bilateral axillary lymph nodes.  Biopsy on 02/21/2019 showed: high grade DCIS with calcifications in the left breast; biopsied left lymph node was negative for malignancy. Prognostic indicators significant for: estrogen receptor, 100% positive and progesterone receptor, 100% positive.   She is scheduled to undergo left breast lumpectomy on 03/20/2019 under Dr. Ninfa Linden.   PREVIOUS RADIATION THERAPY: No  PAST MEDICAL HISTORY:  Past Medical History:  Diagnosis Date   Abnormal Pap smear 7/13   ASCUS/positive HR HPV, negative colpo   Anemia 2006   Condylomata acuminata in female    Family history of cancer    Fibroids    with enlarged uterus   Hypertension    Pulmonary embolism ([place]) 2008   secondary to OCP    PAST SURGICAL HISTORY: Past Surgical History:  Procedure Laterality Date   IR GENERIC HISTORICAL   03/31/2016   IR RADIOLOGIST EVAL & MGMT 03/31/2016 Marybelle Killings, MD [facility] INTERV RAD   IR GENERIC HISTORICAL  01/06/2016   IR RADIOLOGIST EVAL & MGMT 01/06/2016 Marybelle Killings, MD [facility] INTERV RAD   left finger reattachment  age 70   LEFT OOPHORECTOMY Left 1998       MYOMECTOMY  2003   MYOMECTOMY  2008   via laparoscopy    FAMILY HISTORY:  Family History  Problem Relation Age of Onset   Diabetes Mother    Hypertension Mother    Heart disease Mother        cardiomegaly   Lupus Mother    Diabetes Father    Hypertension Father    Stroke Father    Hypertension Maternal Grandmother    Stroke Maternal Grandmother    Hypertension Sister    Seizures Sister    COPD Sister    Cancer Maternal Aunt        unk type    SOCIAL HISTORY:  Social History   Tobacco Use   Smoking status: Never Smoker   Smokeless tobacco: Never Used  Substance Use Topics   Alcohol use: Yes    Alcohol/week: 0.0 - 1.0 standard drinks   Drug use: No    ALLERGIES:  Allergies  Allergen Reactions   Minocycline Hcl Hives and Itching   Sulfa Antibiotics Swelling    Swell of the lips     MEDICATIONS:  Current Outpatient Medications  Medication Sig Dispense Refill   ALPRAZolam (XANAX) 0.25 MG tablet Take 0.25 mg by mouth 2 (two) times daily as needed.     amLODipine-valsartan (EXFORGE) 10-320 MG tablet Take 1  tablet by mouth daily.      Ascorbic Acid (VITAMIN C) 100 MG tablet Take 100 mg by mouth daily.     Cholecalciferol (VITAMIN D3) 2000 units capsule      Dexlansoprazole (DEXILANT PO) Take 1 tablet by mouth every morning.      ferrous sulfate 325 (65 FE) MG tablet      hydrochlorothiazide (HYDRODIURIL) 25 MG tablet Take 25 mg by mouth daily.      ketoconazole (NIZORAL) 2 % cream ketoconazole 2 % topical cream  APPLY TO THE BACK AND SHOULDERS BY TOPICAL ROUTE QHS     ketoconazole (NIZORAL) 2 % shampoo ketoconazole 2 % shampoo  USE AS BODY WASH FOR BACK IN SHOWER QD      Probiotic Product (PROBIOTIC-10) CAPS Take by mouth daily as needed.     SPIRULINA PO Take by mouth daily.     tazarotene (TAZORAC) 0.05 % cream Tazorac 0.05 % topical cream  APPLY A PEA-SIZED AMOUNT TO ENTIRE FACE QOHS X2 WEEKS AND THEN INCREASE TO QHS AS TOLERATED.     HYDROcodone-acetaminophen (NORCO) 5-325 MG tablet Take 1 tablet by mouth every 6 (six) hours as needed for severe pain. (Patient not taking: Reported on 03/08/2019) 20 tablet 0   No current facility-administered medications for this encounter.     REVIEW OF SYSTEMS:  A 10+ POINT REVIEW OF SYSTEMS WAS OBTAINED including neurology, dermatology, psychiatry, cardiac, respiratory, lymph, extremities, GI, GU, musculoskeletal, constitutional, reproductive, HEENT. She is without concerns today.  She denies any pain within the breast nipple discharge or bleeding prior to biopsy.   PHYSICAL EXAM:  height is _0  (1.651 m) and weight is 198 lb 6.4 oz (90 kg). Her temporal temperature is 98.5 F (36.9 C). Her blood pressure is 124/80 and her pulse is 78. Her respiration is 18 and oxygen saturation is 100%.   General: Alert and oriented, in no acute distress HEENT: Head is normocephalic. Extraocular movements are intact. Oropharynx is clear. Neck: Neck is supple, no palpable cervical or supraclavicular lymphadenopathy. Heart: Regular in rate and rhythm with no murmurs, rubs, or gallops. Chest: Clear to auscultation bilaterally, with no rhonchi, wheezes, or rales. Abdomen: Soft, nontender, nondistended, with no rigidity or guarding. Extremities: No cyanosis or edema. Lymphatics: see Neck Exam Skin: No concerning lesions. Musculoskeletal: symmetric strength and muscle tone throughout. Neurologic: Cranial nerves II through XII are grossly intact. No obvious focalities. Speech is fluent. Coordination is intact. Psychiatric: Judgment and insight are intact. Affect is appropriate. Right Breast: no palpable mass, nipple discharge or  bleeding. Left Breast: Small biopsy site in the upper inner quadrant.  No dominant mass appreciated breast nipple discharge or bleeding  ECOG = 1  0 - Asymptomatic (Fully active, able to carry on all predisease activities without restriction)  1 - Symptomatic but completely ambulatory (Restricted in physically strenuous activity but ambulatory and able to carry out work of a light or sedentary nature. For example, light housework, office work)  2 - Symptomatic, <50% in bed during the day (Ambulatory and capable of all self care but unable to carry out any work activities. Up and about more than 50% of waking hours)  3 - Symptomatic, >50% in bed, but not bedbound (Capable of only limited self-care, confined to bed or chair 50% or more of waking hours)  4 - Bedbound (Completely disabled. Cannot carry on any self-care. Totally confined to bed or chair)  5 - Death   Lolita Rieger, Creech RH, Tormey DC, et  al. [phone]). "Toxicity and response criteria of the [facility]". [place] Oncol. 5 (6): 649-55  LABORATORY DATA:  Lab Results  Component Value Date   WBC 6.8 10/30/2018   HGB 11.3 (L) 10/30/2018   HCT 35.6 (L) 10/30/2018   MCV 90.1 10/30/2018   PLT 275 10/30/2018   NEUTROABS 3.8 10/30/2018   Lab Results  Component Value Date   NA 135 10/30/2018   K 3.8 10/30/2018   CL 102 10/30/2018   CO2 27 10/30/2018   GLUCOSE 106 (H) 10/30/2018   CREATININE 0.68 10/30/2018   CALCIUM 9.3 10/30/2018      RADIOGRAPHY: Mm Digital Diagnostic Unilat L  Result Date: 02/16/2019 CLINICAL DATA:  Recall from screening mammography with tomosynthesis, calcifications involving the UPPER INNER LEFT breast and enlarged RIGHT axillary lymph nodes. EXAM: DIGITAL DIAGNOSTIC LEFT MAMMOGRAM WITH CAD AND TOMO ULTRASOUND BILATERAL AXILLA COMPARISON:  Previous exam(s). ACR Breast Density Category c: The breast tissue is heterogeneously dense, which may obscure small masses. FINDINGS: Standard  spot magnification CC and mediolateral views of the LEFT breast calcifications and a standard 2D full field mediolateral view of the LEFT breast were obtained. Spot magnification views confirm a group of fine heterogeneous calcifications involving the UPPER INNER QUADRANT at MIDDLE depth which span approximately 1.3 x 1.1 x 1.3 cm. There are scattered linear forms. No suspicious findings elsewhere on the full field mediolateral image. The full field mediolateral image was processed with CAD. Targeted RIGHT axillary ultrasound is performed, showing multiple lymph nodes with mild cortical thickening, though all of the nodes have maintained normal morphology with normal fatty hila. Four nodes were measured, but there are multiple other lymph nodes present. The largest node measured 1.9 cm with a cortical thickness of 5 mm. Targeted LEFT axillary ultrasound is performed, showing multiple lymph nodes with mild cortical thickening, though again, the nodes maintain normal morphology with normal fatty hila. Five nodes were measured, but again, there are multiple other lymph nodes present. Two of the lymph nodes may have microcalcifications within the thickened cortices. A node containing possible microcalcifications measures approximately 1.3 cm with a cortical thickness of 5 mm. IMPRESSION: 1. Suspicious calcifications involving the UPPER INNER QUADRANT of the LEFT breast at MIDDLE depth with a maximum span of 1.3 cm. 2. Multiple indeterminate though likely reactive BILATERAL axillary lymph nodes. RECOMMENDATION: 1. Stereotactic core needle biopsy of the LEFT breast calcifications. 2. Ultrasound-guided core needle biopsy of a LEFT axillary lymph node that may contain microcalcifications. I have discussed the findings and recommendations with the patient. BI-RADS CATEGORY  4: Suspicious. Electronically Signed   By: Evangeline Dakin M.D.   On: 02/16/2019 16:45   Mm 3d Screen Breast Bilateral  Result Date:  02/13/2019 CLINICAL DATA:  Screening. EXAM: DIGITAL SCREENING BILATERAL MAMMOGRAM WITH TOMO AND CAD COMPARISON:  Previous exam(s). ACR Breast Density Category d: The breast tissue is extremely dense, which lowers the sensitivity of mammography. FINDINGS: In the right breast question abnormal enlarged lymph nodes in the right axilla requires further evaluation. In the left breast possible calcifications requires further evaluation. Images were processed with CAD. IMPRESSION: Further evaluation is suggested for possible abnormal enlarged lymph nodes in the right axilla in the right breast. Further evaluation is suggested for possible calcifications in the left breast. RECOMMENDATION: Diagnostic mammogram and possible ultrasound of left breast. Ultrasound of right axilla. (Code:FI-B-50M) The patient will be contacted regarding the findings, and additional imaging will be scheduled. BI-RADS CATEGORY  0: Incomplete. Need  additional imaging evaluation and/or prior mammograms for comparison. Electronically Signed   By: Abelardo Diesel M.D.   On: 02/13/2019 11:01   [place] Axillary Node Core Biopsy Left  Addendum Date: 02/22/2019   ADDENDUM REPORT: 02/22/2019 14:54 ADDENDUM: Pathology revealed REACTIVE LYMPH NODE of the LEFT axilla. This was found to be concordant by Dr. Everlean Alstrom. Pathology revealed INTERMEDIATE TO HIGH GRADE DUCTAL CARCINOMA IN SITU WITH CALCIFICATIONS of the LEFT breast upper inner calcifications. This was found to be concordant by Dr. Everlean Alstrom. Pathology results were discussed with the patient by telephone. The patient reported doing well after the biopsies with tenderness at the sites. Post biopsy instructions and care were reviewed and questions were answered. The patient was encouraged to call [facility] for any additional concerns. Surgical consultation has been arranged with Dr. Coralie Keens at [facility] on March 02, 2019. Pathology  results reported by Stacie Acres, RN on 02/22/2019. Electronically Signed   By: Everlean Alstrom M.D.   On: 02/22/2019 14:54   Result Date: 02/22/2019 CLINICAL DATA:  53 year old female with s cortical thickening of bilateral axillary lymph nodes as well as new left breast calcifications. Patient presents for ultrasound-guided biopsy of a left axillary lymph node. She will subsequently have stereotactic guided biopsy of the suspicious left breast calcifications. EXAM: [place] AXILLARY NODE CORE BIOPSY LEFT COMPARISON:  Previous exam(s). FINDINGS: I met with the patient and we discussed the procedure of ultrasound-guided biopsy, including benefits and alternatives. We discussed the high likelihood of a successful procedure. We discussed the risks of the procedure, including infection, bleeding, tissue injury, clip migration, and inadequate sampling. Informed written consent was given. The usual time-out protocol was performed immediately prior to the procedure. Using sterile technique and 1% Lidocaine as local anesthetic, under direct ultrasound visualization, a 14 gauge spring-loaded device was used to perform biopsy of 1 of the lymph nodes with cortical thickening in the left axilla using a inferolateral to superomedial approach. At the conclusion of the procedure a spiral shaped HydroMARK tissue marker clip was deployed into the biopsy cavity. Follow up 2 view mammogram was performed and dictated separately. IMPRESSION: Ultrasound guided biopsy of 1 of the morphologically abnormal lymph nodes in the left axilla. No apparent complications. Electronically Signed: By: Everlean Alstrom M.D. On: 02/21/2019 07:52   [place] Axilla Left  Result Date: 02/16/2019 CLINICAL DATA:  Recall from screening mammography with tomosynthesis, calcifications involving the UPPER INNER LEFT breast and enlarged RIGHT axillary lymph nodes. EXAM: DIGITAL DIAGNOSTIC LEFT MAMMOGRAM WITH CAD AND TOMO ULTRASOUND BILATERAL AXILLA COMPARISON:  Previous  exam(s). ACR Breast Density Category c: The breast tissue is heterogeneously dense, which may obscure small masses. FINDINGS: Standard spot magnification CC and mediolateral views of the LEFT breast calcifications and a standard 2D full field mediolateral view of the LEFT breast were obtained. Spot magnification views confirm a group of fine heterogeneous calcifications involving the UPPER INNER QUADRANT at MIDDLE depth which span approximately 1.3 x 1.1 x 1.3 cm. There are scattered linear forms. No suspicious findings elsewhere on the full field mediolateral image. The full field mediolateral image was processed with CAD. Targeted RIGHT axillary ultrasound is performed, showing multiple lymph nodes with mild cortical thickening, though all of the nodes have maintained normal morphology with normal fatty hila. Four nodes were measured, but there are multiple other lymph nodes present. The largest node measured 1.9 cm with a cortical thickness of 5 mm. Targeted LEFT axillary ultrasound is performed,  showing multiple lymph nodes with mild cortical thickening, though again, the nodes maintain normal morphology with normal fatty hila. Five nodes were measured, but again, there are multiple other lymph nodes present. Two of the lymph nodes may have microcalcifications within the thickened cortices. A node containing possible microcalcifications measures approximately 1.3 cm with a cortical thickness of 5 mm. IMPRESSION: 1. Suspicious calcifications involving the UPPER INNER QUADRANT of the LEFT breast at MIDDLE depth with a maximum span of 1.3 cm. 2. Multiple indeterminate though likely reactive BILATERAL axillary lymph nodes. RECOMMENDATION: 1. Stereotactic core needle biopsy of the LEFT breast calcifications. 2. Ultrasound-guided core needle biopsy of a LEFT axillary lymph node that may contain microcalcifications. I have discussed the findings and recommendations with the patient. BI-RADS CATEGORY  4: Suspicious.  Electronically Signed   By: Evangeline Dakin M.D.   On: 02/16/2019 16:45   [place] Axilla Right  Result Date: 02/16/2019 CLINICAL DATA:  Recall from screening mammography with tomosynthesis, calcifications involving the UPPER INNER LEFT breast and enlarged RIGHT axillary lymph nodes. EXAM: DIGITAL DIAGNOSTIC LEFT MAMMOGRAM WITH CAD AND TOMO ULTRASOUND BILATERAL AXILLA COMPARISON:  Previous exam(s). ACR Breast Density Category c: The breast tissue is heterogeneously dense, which may obscure small masses. FINDINGS: Standard spot magnification CC and mediolateral views of the LEFT breast calcifications and a standard 2D full field mediolateral view of the LEFT breast were obtained. Spot magnification views confirm a group of fine heterogeneous calcifications involving the UPPER INNER QUADRANT at MIDDLE depth which span approximately 1.3 x 1.1 x 1.3 cm. There are scattered linear forms. No suspicious findings elsewhere on the full field mediolateral image. The full field mediolateral image was processed with CAD. Targeted RIGHT axillary ultrasound is performed, showing multiple lymph nodes with mild cortical thickening, though all of the nodes have maintained normal morphology with normal fatty hila. Four nodes were measured, but there are multiple other lymph nodes present. The largest node measured 1.9 cm with a cortical thickness of 5 mm. Targeted LEFT axillary ultrasound is performed, showing multiple lymph nodes with mild cortical thickening, though again, the nodes maintain normal morphology with normal fatty hila. Five nodes were measured, but again, there are multiple other lymph nodes present. Two of the lymph nodes may have microcalcifications within the thickened cortices. A node containing possible microcalcifications measures approximately 1.3 cm with a cortical thickness of 5 mm. IMPRESSION: 1. Suspicious calcifications involving the UPPER INNER QUADRANT of the LEFT breast at MIDDLE depth with a maximum  span of 1.3 cm. 2. Multiple indeterminate though likely reactive BILATERAL axillary lymph nodes. RECOMMENDATION: 1. Stereotactic core needle biopsy of the LEFT breast calcifications. 2. Ultrasound-guided core needle biopsy of a LEFT axillary lymph node that may contain microcalcifications. I have discussed the findings and recommendations with the patient. BI-RADS CATEGORY  4: Suspicious. Electronically Signed   By: Evangeline Dakin M.D.   On: 02/16/2019 16:45   Mm Clip Placement Left  Result Date: 02/21/2019 CLINICAL DATA:  Post stereotactic guided biopsy of calcifications in the upper slightly inner left breast and ultrasound-guided biopsy of a morphologically abnormal lymph node in the left axilla. EXAM: DIAGNOSTIC LEFT MAMMOGRAM POST STEREOTACTIC BIOPSY COMPARISON:  Previous exam(s). FINDINGS: Mammographic images were obtained following stereotactic guided biopsy of calcifications in the upper slightly inner left breast and ultrasound-guided biopsy of a morphologically abnormal lymph node in the left axilla. A spiral shaped HydroMARK clip is located at the site of the biopsied lymph node in the left axilla, visualized on the  ML view. A coil shaped biopsy marking clip is present and displaced approximately 2.5 cm inferior to the biopsied calcifications in the upper slightly inner left breast. Residual calcifications are present at the biopsy site. IMPRESSION: Coil shaped biopsy marking clip located 2.5 cm inferior to the biopsied calcifications in the upper slightly inner left breast. Final Assessment: Post Procedure Mammograms for Marker Placement Electronically Signed   By: Everlean Alstrom M.D.   On: 02/21/2019 08:23   Mm Lt Breast Bx W Loc Dev 1st Lesion Image Bx Spec Stereo Guide  Addendum Date: 02/22/2019   ADDENDUM REPORT: 02/22/2019 14:59 ADDENDUM: Pathology revealed REACTIVE LYMPH NODE of the LEFT axilla. This was found to be concordant by Dr. Everlean Alstrom. Pathology revealed INTERMEDIATE TO  HIGH GRADE DUCTAL CARCINOMA IN SITU WITH CALCIFICATIONS of the LEFT breast upper inner calcifications. This was found to be concordant by Dr. Everlean Alstrom. Pathology results were discussed with the patient by telephone. The patient reported doing well after the biopsies with tenderness at the sites. Post biopsy instructions and care were reviewed and questions were answered. The patient was encouraged to call [facility] for any additional concerns. Surgical consultation has been arranged with Dr. Coralie Keens at [facility] on March 02, 2019. Pathology results reported by Stacie Acres, RN on 02/22/2019. Electronically Signed   By: Everlean Alstrom M.D.   On: 02/22/2019 14:59   Result Date: 02/22/2019 CLINICAL DATA:  53 year old female with new suspicious calcifications in the upper slightly inner left breast. EXAM: BREAST STEREOTACTIC CORE NEEDLE BIOPSY COMPARISON:  Previous exams. FINDINGS: The patient and I discussed the procedure of stereotactic-guided biopsy including benefits and alternatives. We discussed the high likelihood of a successful procedure. We discussed the risks of the procedure including infection, bleeding, tissue injury, clip migration, and inadequate sampling. Informed written consent was given. The usual time out protocol was performed immediately prior to the procedure. Using sterile technique and 1% Lidocaine as local anesthetic, under stereotactic guidance, a 9 gauge vacuum assisted device was used to perform core needle biopsy of the calcifications in the upper slightly inner left breast using a superior to inferior approach. Specimen radiograph was performed showing the presence of calcifications. Specimens with calcifications are identified for pathology. Lesion quadrant: Upper inner At the conclusion of the procedure, a coil shaped tissue marker clip was deployed into the biopsy cavity. Follow-up 2-view mammogram was performed and  dictated separately. IMPRESSION: Stereotactic-guided biopsy of the calcifications in the upper slightly inner left breast. No apparent complications. Electronically Signed: By: Everlean Alstrom M.D. On: 02/21/2019 08:15      IMPRESSION: Stage 0 Left Breast UIQ, DCIS, ER+ / PR+, Grade 3  Patient would be a good candidate for breast conservation therapy with radiation therapy directed at the left breast.  Given the patient's age as well as tumor characteristics I would not recommend excisional biopsy alone in this situation.  We did discuss the potential option of mastectomy but given the small size of the lesion this would  be a drastic surgical approach.  The patient is interested in breast conserving therapy.  Today, I talked to the patient  about the findings and work-up thus far.  We discussed the natural history of breast cancer and general treatment, highlighting the role of radiotherapy in the management.  We discussed the available radiation techniques, and focused on the details of logistics and delivery.  We reviewed the anticipated acute and late sequelae associated with radiation in  this setting.  The patient was encouraged to ask questions that I answered to the best of my ability.   PLAN: Patient is scheduled for surgery with Dr. Ninfa Linden on November 3.  She has been scheduled for reevaluation in radiation oncology the week of November 23.  Patient is very interested in nutrition treatment as it relates to her new diagnosis of breast cancer and we will arrange for consultation with nutrition.  She would like to proceed with this evaluation prior to her surgery if possible.  Patient is also interested in proceeding with genetics evaluation and will do this later today.    ------------------------------------------------  Blair Promise, PhD, MD  This document serves as a record of services personally performed by Gery Pray, MD. It was created on his behalf by Wilburn Mylar, a  trained medical scribe. The creation of this record is based on the scribe's personal observations and the provider's statements to them. This document has been checked and approved by the attending provider.

## 2019-03-08 NOTE — Patient Instructions (Signed)
Coronavirus (COVID-19) Are you at risk?  Are you at risk for the Coronavirus (COVID-19)?  To be considered HIGH RISK for Coronavirus (COVID-19), you have to meet the following criteria:  . Traveled to [place], [place], [place], [place] or [place]; or in the [place] to [place], [place], [place], or [place]; and have fever, cough, and shortness of breath within the last 2 weeks of travel OR . Been in close contact with a person diagnosed with COVID-19 within the last 2 weeks and have fever, cough, and shortness of breath . IF YOU DO NOT MEET THESE CRITERIA, YOU ARE CONSIDERED LOW RISK FOR COVID-19.  What to do if you are HIGH RISK for COVID-19?  . If you are having a medical emergency, call 911. . Seek medical care right away. Before you go to a doctor's office, urgent care or emergency department, call ahead and tell them about your recent travel, contact with someone diagnosed with COVID-19, and your symptoms. You should receive instructions from your physician's office regarding next steps of care.  . When you arrive at healthcare provider, tell the healthcare staff immediately you have returned from visiting [place], [place], [place], [place] or [place]; or traveled in the [place] to [place], [place], [place], or [place]; in the last two weeks or you have been in close contact with a person diagnosed with COVID-19 in the last 2 weeks.   . Tell the health care staff about your symptoms: fever, cough and shortness of breath. . After you have been seen by a medical provider, you will be either: o Tested for (COVID-19) and discharged home on quarantine except to seek medical care if symptoms worsen, and asked to  - Stay home and avoid contact with others until you get your results (4-5 days)  - Avoid travel on public transportation if possible (such as bus, train, or airplane) or o Sent to the Emergency Department by EMS for evaluation, COVID-19 testing, and possible  admission depending on your condition and test results.  What to do if you are LOW RISK for COVID-19?  Reduce your risk of any infection by using the same precautions used for avoiding the common cold or flu:  . Wash your hands often with soap and warm water for at least 20 seconds.  If soap and water are not readily available, use an alcohol-based hand sanitizer with at least 60% alcohol.  . If coughing or sneezing, cover your mouth and nose by coughing or sneezing into the elbow areas of your shirt or coat, into a tissue or into your sleeve (not your hands). . Avoid shaking hands with others and consider head nods or verbal greetings only. . Avoid touching your eyes, nose, or mouth with unwashed hands.  . Avoid close contact with people who are sick. . Avoid places or events with large numbers of people in one location, like concerts or sporting events. . Carefully consider travel plans you have or are making. . If you are planning any travel outside or inside the [place], visit the CDC's Travelers' Health webpage for the latest health notices. . If you have some symptoms but not all symptoms, continue to monitor at home and seek medical attention if your symptoms worsen. . If you are having a medical emergency, call 911.   ADDITIONAL HEALTHCARE OPTIONS FOR PATIENTS  [facility] / e-Visit: [email or web address]         [facility]: [phone]  [facility]: [phone]                   [facility]: [phone]   

## 2019-03-09 ENCOUNTER — Telehealth: Payer: Self-pay

## 2019-03-09 NOTE — Telephone Encounter (Signed)
Scheduled appt per 10/23 shc message - pt aware of appt added for nutritionist.

## 2019-03-12 ENCOUNTER — Telehealth: Payer: Self-pay | Admitting: *Deleted

## 2019-03-12 NOTE — Telephone Encounter (Signed)
Call from patient advising that she has FMLA and STD forms related to November 3 surgery date. After reviewing processes, realized surgery is with [facility] and referred patient to their office for forms completion.  Routing to provider. Encounter closed.

## 2019-03-13 ENCOUNTER — Encounter: Payer: BC Managed Care – PPO | Admitting: Genetic Counselor

## 2019-03-14 ENCOUNTER — Inpatient Hospital Stay: Payer: BC Managed Care – PPO

## 2019-03-14 NOTE — Progress Notes (Signed)
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 Planning lumpectomy on 11/3.  Patient medical history of HTN, fe deficiency anemia.    Spoke with patient via phone for nutrition consultation.  Patient reports that she has a good appetite and has been trying to get to a healthy weight.  She has already started adding more plant foods into diet, limiting sugary beverages, and more physical activity.     Nutrition Focused Physical Exam: deferred   Medications: Vit C, Vit D, Fesulfate   Labs: reviewed   Anthropometrics:   Height: 65 inches Weight: 198 lb 6.4 oz Noted weight in June 205 lb BMI: 33  Patient has been working to loose weight    NUTRITION DIAGNOSIS: Food and nutrition knowledge deficit related to diagnosis of breast cancer as evidenced by patient wanting to adopt healthy lifestyle   INTERVENTION:  Discussed current recommendations from [employer] for [place] regarding diet and cancer.   Discussed importance of physical activity.  Will mail handout regarding plant based diet, weight loss tips.   Discussed with patient if would like more in depth weight loss education would recommend referral to [facility] center for weight management.   Contact information provided   MONITORING, EVALUATION, GOAL: Patient will consume plant based diet.   Next Visit: no follow-up at this time  Kru Allman B. Zenia Resides, [place], [place] Registered Dietitian [phone] (pager)

## 2019-03-14 NOTE — Pre-Procedure Instructions (Signed)
[facility], [address] [place] [address] Phone: [phone] Fax: [phone]  [facility] for [place], [place] [address] [place] [address] Phone: [phone] Fax: [phone]  [place], [place] [address] Phone: [phone] Fax: [phone]    Your procedure is scheduled on Tues., Nov. 3, 2020 from 10:17AM-11:17AM  Report to [facility] Entrance "A" at 8:15AM  Call this number if you have problems the morning of surgery:  [phone]   Remember:  Do not eat after midnight on Nov. 2nd  You may drink clear liquids until 3 hours (7:15AM) prior to surgery time .  Clear liquids allowed are: Water, Juice (non-citric and without pulp), Carbonated beverages, Clear Tea, Black Coffee only, Plain Jell-O only, Gatorade and Plain Popsicles only    Take these medicines the morning of surgery with A SIP OF WATER: Dexlansoprazole (DEXILANT)   If Needed:  ALPRAZolam Duanne Moron)  As of today, stop taking all Aspirin (unless instructed by your doctor) and Other Aspirin containing products, Vitamins, Fish oils, and Herbal medications. Also stop all NSAIDS i.e. Advil, Ibuprofen, Motrin, Aleve, Anaprox, Naproxen, BC, Goody Powders, and all Supplements.  No Smoking of any kind, Tobacco, or Alcohol products 24 hours prior to your procedure.   Special instructions:   [place]- Preparing For Surgery  Before surgery, you can play an important role. Because skin is not sterile, your skin needs to be as free of germs as possible. You can reduce the number of germs on your skin by washing with CHG (chlorahexidine gluconate) Soap before surgery.  CHG is an antiseptic cleaner which kills germs and bonds with the skin to continue killing germs even after washing.    Please do not use if you have an allergy  to CHG or antibacterial soaps. If your skin becomes reddened/irritated stop using the CHG.  Do not shave (including legs and underarms) for at least 48 hours prior to first CHG shower. It is OK to shave your face.  Please follow these instructions carefully.   1. Shower the NIGHT BEFORE SURGERY and the MORNING OF SURGERY with CHG.   2. If you chose to wash your hair, wash your hair first as usual with your normal shampoo.  3. After you shampoo, rinse your hair and body thoroughly to remove the shampoo.  4. Use CHG as you would any other liquid soap. You can apply CHG directly to the skin and wash gently with a scrungie or a clean washcloth.   5. Apply the CHG Soap to your body ONLY FROM THE NECK DOWN.  Do not use on open wounds or open sores. Avoid contact with your eyes, ears, mouth and genitals (private parts). Wash Face and genitals (private parts)  with your normal soap.  6. Wash thoroughly, paying special attention to the area where your surgery will be performed.  7. Thoroughly rinse your body with warm water from the neck down.  8. DO NOT shower/wash with your normal soap after using and rinsing off the CHG Soap.  9. Pat yourself dry with a CLEAN TOWEL.  10. Wear CLEAN PAJAMAS to bed the night before surgery, wear comfortable clothes the morning of surgery  11. Place CLEAN SHEETS on your bed the night of your first shower and DO NOT SLEEP WITH PETS.   Day of  Surgery:            Remember to brush your teeth WITH YOUR REGULAR TOOTHPASTE.  Do not wear jewelry, make-up or nail polish.  Do not wear lotions, powders, or perfumes, or deodorant.  Do not shave 48 hours prior to surgery.    Do not bring valuables to the hospital.  [facility] is not responsible for any belongings or valuables.  Contacts, dentures or bridgework may not be worn into surgery.    For patients admitted to the hospital, discharge time will be determined by your treatment team.  Patients discharged the  day of surgery will not be allowed to drive home, and someone age 45 and over needs to stay with them for 24 hours.   Please wear clean clothes to the hospital/surgery center.    Please read over the following fact sheets that you were given.

## 2019-03-15 ENCOUNTER — Encounter (HOSPITAL_COMMUNITY): Payer: Self-pay

## 2019-03-15 ENCOUNTER — Encounter (HOSPITAL_COMMUNITY)
Admission: RE | Admit: 2019-03-15 | Discharge: 2019-03-15 | Disposition: A | Discharge status: Home / Self Care | Payer: BC Managed Care – PPO | Source: Ambulatory Visit | Attending: Surgery | Admitting: Surgery

## 2019-03-15 ENCOUNTER — Other Ambulatory Visit: Payer: Self-pay

## 2019-03-15 DIAGNOSIS — I10 Essential (primary) hypertension: Secondary | ICD-10-CM | POA: Diagnosis not present

## 2019-03-15 DIAGNOSIS — Z01818 Encounter for other preprocedural examination: Secondary | ICD-10-CM | POA: Diagnosis present

## 2019-03-15 DIAGNOSIS — R9431 Abnormal electrocardiogram [ECG] [EKG]: Secondary | ICD-10-CM | POA: Diagnosis not present

## 2019-03-15 HISTORY — DX: Malignant (primary) neoplasm, unspecified: C80.1

## 2019-03-15 HISTORY — DX: Gastro-esophageal reflux disease without esophagitis: K21.9

## 2019-03-15 LAB — CBC
HCT: 39.7 % (ref 36.0–46.0)
Hemoglobin: 12.3 g/dL (ref 12.0–15.0)
MCH: 28.5 pg (ref 26.0–34.0)
MCHC: 31 g/dL (ref 30.0–36.0)
MCV: 92.1 fL (ref 80.0–100.0)
Platelets: 304 10*3/uL (ref 150–400)
RBC: 4.31 MIL/uL (ref 3.87–5.11)
RDW: 13.4 % (ref 11.5–15.5)
WBC: 4.8 10*3/uL (ref 4.0–10.5)
nRBC: 0 % (ref 0.0–0.2)

## 2019-03-15 LAB — BASIC METABOLIC PANEL
Anion gap: 9 (ref 5–15)
BUN: 6 mg/dL (ref 6–20)
CO2: 24 mmol/L (ref 22–32)
Calcium: 9.3 mg/dL (ref 8.9–10.3)
Chloride: 103 mmol/L (ref 98–111)
Creatinine, Ser: 0.74 mg/dL (ref 0.44–1.00)
GFR calc Af Amer: >60 mL/min (ref >60)
GFR calc non Af Amer: >60 mL/min (ref >60)
Glucose, Bld: 94 mg/dL (ref 70–99)
Potassium: 3.6 mmol/L (ref 3.5–5.1)
Sodium: 136 mmol/L (ref 135–145)

## 2019-03-15 NOTE — Progress Notes (Signed)
PCP - Jossie Ng, MD Cardiologist - Adrian Prows, MD  Chest x-ray - N/A EKG - 03/15/19 Stress Test - denies ECHO - 12/13/08 Cardiac Cath - denies  Blood Thinner Instructions: N/A Aspirin Instructions:N/A  ERAS Protcol - patient can have clear liquids up to 3 hours prior to procedure  COVID TEST- scheduled 03/16/19; patient aware of need for self-quarantine and states verbal understand  Coronavirus Screening  Have you experienced the following symptoms:  Cough yes/no: No Fever (>100.34F)  yes/no: No Runny nose yes/no: No Sore throat yes/no: No Difficulty breathing/shortness of breath  yes/no: No  Have you or a family member traveled in the last 14 days and where? yes/no: No  If the patient indicates "YES" to the above questions, their PAT will be rescheduled to limit the exposure to others and, the surgeon will be notified. THE PATIENT WILL NEED TO BE ASYMPTOMATIC FOR 14 DAYS.   If the patient is not experiencing any of these symptoms, the PAT nurse will instruct them to NOT bring anyone with them to their appointment since they may have these symptoms or traveled as well.   Please remind your patients and families that hospital visitation restrictions are in effect and the importance of the restrictions.   Anesthesia review: No   Patient denies shortness of breath, fever, cough and chest pain at PAT appointment   All instructions explained to the patient, with a verbal understanding of the material. Patient agrees to go over the instructions while at home for a better understanding. Patient also instructed to self quarantine after being tested for COVID-19. The opportunity to ask questions was provided.

## 2019-03-16 ENCOUNTER — Encounter: Payer: Self-pay | Admitting: Licensed Clinical Social Worker

## 2019-03-16 ENCOUNTER — Ambulatory Visit: Payer: Self-pay | Admitting: Licensed Clinical Social Worker

## 2019-03-16 ENCOUNTER — Other Ambulatory Visit (HOSPITAL_COMMUNITY)
Admission: RE | Admit: 2019-03-16 | Discharge: 2019-03-16 | Disposition: A | Discharge status: Home / Self Care | Payer: BC Managed Care – PPO | Source: Ambulatory Visit | Attending: Surgery | Admitting: Surgery

## 2019-03-16 ENCOUNTER — Telehealth: Payer: Self-pay | Admitting: Licensed Clinical Social Worker

## 2019-03-16 DIAGNOSIS — Z20828 Contact with and (suspected) exposure to other viral communicable diseases: Secondary | ICD-10-CM | POA: Diagnosis not present

## 2019-03-16 DIAGNOSIS — Z01812 Encounter for preprocedural laboratory examination: Secondary | ICD-10-CM | POA: Insufficient documentation

## 2019-03-16 DIAGNOSIS — Z1379 Encounter for other screening for genetic and chromosomal anomalies: Secondary | ICD-10-CM

## 2019-03-16 NOTE — Telephone Encounter (Signed)
Revealed negative genetic testing on the STAT Panel. Patient did not wish to pursue further testing at this time. This normal result is reassuring and indicates that it is unlikely [name]'s cancer is due to a hereditary cause.  It is unlikely that there is an increased risk of another cancer due to a mutation in one of these genes.  However, genetic testing is not perfect, and cannot definitively rule out a hereditary cause.  It will be important for her to keep in contact with genetics to learn if any additional testing may be needed in the future.

## 2019-03-16 NOTE — Progress Notes (Addendum)
HPI:  [name] was previously seen in the [facility] clinic due to a personal and family history of cancer and concerns regarding a hereditary predisposition to cancer. Please refer to our prior cancer genetics clinic note for more information regarding our discussion, assessment and recommendations, at the time. Ms. Mccard recent genetic test results were disclosed to her, as were recommendations warranted by these results. These results and recommendations are discussed in more detail below.  CANCER HISTORY:  Oncology History  Malignant neoplasm of upper-inner quadrant of left breast in female, estrogen receptor positive ([place])  02/21/2019 Initial Diagnosis   Malignant neoplasm of upper-inner quadrant of left breast in female, estrogen receptor positive ([facility])   02/21/2019 Cancer Staging   Staging form: Breast, AJCC 8th Edition - Clinical stage from 02/21/2019: Stage 0 (cTis (DCIS), cN0, cM0, ER+, PR+) - Signed by Gardenia Phlegm, NP on 02/28/2019     FAMILY HISTORY:  We obtained a detailed, 4-generation family history.  Significant diagnoses are listed below: Family History  Problem Relation Age of Onset  . Diabetes Mother   . Hypertension Mother   . Heart disease Mother        cardiomegaly  . Lupus Mother   . Diabetes Father   . Hypertension Father   . Stroke Father   . Hypertension Maternal Grandmother   . Stroke Maternal Grandmother   . Hypertension Sister   . Seizures Sister   . COPD Sister   . Cancer Maternal Aunt        unk type    Ms. Bonsignore does not have children. She has 3 maternal half sisters, no history of cancer.  Ms. Rick mother died at 71, no history of cancer. Patient has 1 maternal half aunt, no cancer. No cancer in maternal cousins. No cancer for maternal grandparents.  Ms. Cuervo father died at 46, no cancer. Patient had 1 paternal aunt, 1 paternal uncle. Her aunt did have cancer, she says a female cancer, but is unsure  exactly which type. Update 10/30: Patient says this aunt had ovarian cancer. No known cancers in paternal cousins. No cancers for paternal grandparents.  Ms. Manzi is unaware of previous family history of genetic testing for hereditary cancer risks. There is no reported Ashkenazi Jewish ancestry. There is no known consanguinity.  GENETIC TEST RESULTS: Genetic testing reported out on 03/16/2019 through the [facility] STAT cancer panel found no pathogenic mutations.The STAT Breast cancer panel offered by [facility] includes sequencing and rearrangement analysis for the following 9 genes:  ATM, BRCA1, BRCA2, CDH1, CHEK2, PALB2, PTEN, STK11 and TP53.   The test report has been scanned into EPIC and is located under the Molecular Pathology section of the Results Review tab.  A portion of the result report is included below for reference.     We discussed with Ms. Hubka that because current genetic testing is not perfect, it is possible there may be a gene mutation in one of these genes that current testing cannot detect, but that chance is small.  We also discussed, that there could be another gene that has not yet been discovered, or that we have not yet tested, that is responsible for the cancer diagnoses in the family. It is also possible there is a hereditary cause for the cancer in the family that Ms. Ferch did not inherit and therefore was not identified in her testing.  Therefore, it is important to remain in touch with cancer genetics in the future  so that we can continue to offer Ms. Stovall the most up to date genetic testing.   ADDITIONAL GENETIC TESTING: Ms. Whitcher did not wish to pursue additional testing at this time. We discussed with Ms. Forker that there are other genes that are associated with increased cancer risk that can be analyzed. Should Ms. Karner wish to pursue additional genetic testing, we are happy to discuss and coordinate this testing, at any time.     CANCER  SCREENING RECOMMENDATIONS: Ms. Hunger test result is considered negative (normal).  This means that we have not identified a hereditary cause for her personal and family history of cancer at this time.   While reassuring, this does not definitively rule out a hereditary predisposition to cancer. It is still possible that there could be genetic mutations that are undetectable by current technology. There could be genetic mutations in genes that have not been tested or identified to increase cancer risk.  Therefore, it is recommended she continue to follow the cancer management and screening guidelines provided by her oncology and primary healthcare provider.   An individual's cancer risk and medical management are not determined by genetic test results alone. Overall cancer risk assessment incorporates additional factors, including personal medical history, family history, and any available genetic information that may result in a personalized plan for cancer prevention and surveillance.  RECOMMENDATIONS FOR FAMILY MEMBERS:  Relatives in this family might be at some increased risk of developing cancer, over the general population risk, simply due to the family history of cancer.  We recommended female relatives in this family have a yearly mammogram beginning at age 16, or 48 years younger than the earliest onset of cancer, an annual clinical breast exam, and perform monthly breast self-exams. Female relatives in this family should also have a gynecological exam as recommended by their primary provider. All family members should have a colonoscopy by age 53, or as directed by their physicians.  It is also possible there is a hereditary cause for the cancer in Ms. Firestine's family that she did not inherit and therefore was not identified in her.  Based on Ms. Burkle's family history of ovarian cancer, we recommended her paternal relatives have genetic counseling and testing. Ms. Ocheltree will let us know if we  can be of any assistance in coordinating genetic counseling and/or testing for these family members.  FOLLOW-UP: Lastly, we discussed with Ms. Andujo that cancer genetics is a rapidly advancing field and it is possible that new genetic tests will be appropriate for her and/or her family members in the future. We encouraged her to remain in contact with cancer genetics on an annual basis so we can update her personal and family histories and let her know of advances in cancer genetics that may benefit this family.   Our contact number was provided. Ms. Knodel questions were answered to her satisfaction, and she knows she is welcome to call us at anytime with additional questions or concerns.   Faith Rogue, MS, [facility] Genetic Counselor [email or web address] Phone: [phone]

## 2019-03-17 LAB — NOVEL CORONAVIRUS, NAA (HOSP ORDER, SEND-OUT TO REF LAB; TAT 18-24 HRS): SARS-CoV-2, NAA: NOT DETECTED

## 2019-03-19 ENCOUNTER — Other Ambulatory Visit: Payer: Self-pay

## 2019-03-19 ENCOUNTER — Encounter: Payer: Self-pay | Admitting: Licensed Clinical Social Worker

## 2019-03-19 ENCOUNTER — Ambulatory Visit
Admission: RE | Admit: 2019-03-19 | Discharge: 2019-03-19 | Disposition: A | Discharge status: Home / Self Care | Payer: BC Managed Care – PPO | Source: Ambulatory Visit | Attending: Surgery | Admitting: Surgery

## 2019-03-19 DIAGNOSIS — D0512 Intraductal carcinoma in situ of left breast: Secondary | ICD-10-CM

## 2019-03-19 NOTE — H&P (Signed)
[name] Documented: 03/02/2019 9:56 AM Location: [facility] Patient #: [id number] DOB: [birth year] Married / Language: English / Race: Black or African American Female   History of Present Illness (Lus Kriegel A. Ninfa Linden MD; 03/02/2019 10:38 AM) The patient is a 53 year old female who presents with breast cancer. This is a pleasant 53 year old female who is referred by Dr. Hale Bogus after the recent diagnosis of ductal carcinoma in situ of the left breast. She is accompanied by her husband. She was recently found to have calcifications in the upper inner left breast on screening mammography. She underwent a stereotactic biopsy of this area as well as reactive lymph nodes in her axilla. Final pathology showed ductal carcinoma in situ which was moderate to high-grade. It was 100% ER and PR positive. The lymph node was only reactive. Her ultrasound did show multiple enlarged reactive lymph nodes in both axilla. She is healthy and without complaints. She denies nipple discharge. She has no previous problems with her breasts. She has no family history of breast cancer.   Past Surgical History Nance Pew, CMA; 03/02/2019 9:56 AM) Breast Biopsy  Left.  Diagnostic Studies History Nance Pew, CMA; 03/02/2019 9:56 AM) Colonoscopy  never Mammogram  within last year Pap Smear  1-5 years ago  Allergies Nance Pew, CMA; 03/02/2019 9:57 AM) Sulfa Drugs  Allergies Reconciled   Medication History (Sabrina Canty, CMA; 03/02/2019 9:59 AM) hydroCHLOROthiazide (25MG  Tablet, Oral) Active. Ferrous Sulfate (325 (65 Fe)MG Tablet, Oral) Active. ALPRAZolam (0.25MG  Tablet, Oral) Active. Spirulina (500MG  Tablet, Oral) Active. amLODIPine Besylate-Valsartan (10-160MG  Tablet, Oral) Active. Cholecalciferol (1.25 MG(50000 UT) Capsule, Oral) Active. Medications Reconciled  Social History Nance Pew, CMA; 03/02/2019 9:56 AM) Alcohol use  Occasional alcohol  use. Caffeine use  Coffee, Tea. No drug use  Tobacco use  Never smoker.  Family History Nance Pew, [place]; 03/02/2019 9:56 AM) Arthritis  Mother. Cerebrovascular Accident  Father. Diabetes Mellitus  Mother. Hypertension  Father, Mother.  Pregnancy / Birth History Nance Pew, [place]; 03/02/2019 9:56 AM) Age at menarche  11 years. Contraceptive History  Depo-provera, Oral contraceptives. Gravida  3 Para  0 Regular periods   Other Problems Nance Pew, CMA; 03/02/2019 9:56 AM) Breast Cancer  Gastroesophageal Reflux Disease  High blood pressure  Oophorectomy     Review of Systems (Sabrina Canty CMA; 03/02/2019 9:56 AM) General Not Present- Appetite Loss, Chills, Fatigue, Fever, Night Sweats, Weight Gain and Weight Loss. Skin Not Present- Change in Wart/Mole, Dryness, Hives, Jaundice, New Lesions, Non-Healing Wounds, Rash and Ulcer. HEENT Not Present- Earache, Hearing Loss, Hoarseness, Nose Bleed, Oral Ulcers, Ringing in the Ears, Seasonal Allergies, Sinus Pain, Sore Throat, Visual Disturbances, Wears glasses/contact lenses and Yellow Eyes. Respiratory Not Present- Bloody sputum, Chronic Cough, Difficulty Breathing, Snoring and Wheezing. Breast Not Present- Breast Mass, Breast Pain, Nipple Discharge and Skin Changes. Cardiovascular Not Present- Chest Pain, Difficulty Breathing Lying Down, Leg Cramps, Palpitations, Rapid Heart Rate, Shortness of Breath and Swelling of Extremities. Gastrointestinal Not Present- Abdominal Pain, Bloating, Bloody Stool, Change in Bowel Habits, Chronic diarrhea, Constipation, Difficulty Swallowing, Excessive gas, Gets full quickly at meals, Hemorrhoids, Indigestion, Nausea, Rectal Pain and Vomiting. Female Genitourinary Not Present- Frequency, Nocturia, Painful Urination, Pelvic Pain and Urgency. Musculoskeletal Not Present- Back Pain, Joint Pain, Joint Stiffness, Muscle Pain, Muscle Weakness and Swelling of Extremities. Neurological  Not Present- Decreased Memory, Fainting, Headaches, Numbness, Seizures, Tingling, Tremor, Trouble walking and Weakness. Psychiatric Not Present- Anxiety, Bipolar, Change in Sleep Pattern, Depression, Fearful and Frequent crying. Endocrine Not Present-  Cold Intolerance, Excessive Hunger, Hair Changes, Heat Intolerance, Hot flashes and New Diabetes. Hematology Not Present- Blood Thinners, Easy Bruising, Excessive bleeding, Gland problems, HIV and Persistent Infections.  Vitals (Sabrina Canty CMA; 03/02/2019 9:59 AM) 03/02/2019 9:59 AM Weight: 198.6 lb Height: 65in Body Surface Area: 1.97 m Body Mass Index: 33.05 kg/m  Temp.: 59F(Temporal)  Pulse: 86 (Regular)  BP: 148/90 (Sitting, Left Arm, Standard)       Physical Exam (Trystyn Dolley A. Ninfa Linden MD; 03/02/2019 10:39 AM) General Mental Status-Alert. General Appearance-Consistent with stated age. Hydration-Well hydrated. Voice-Normal.  Head and Neck Head-normocephalic, atraumatic with no lesions or palpable masses. Trachea-midline. Thyroid Gland Characteristics - normal size and consistency.  Eye Eyeball - Bilateral-Extraocular movements intact. Sclera/Conjunctiva - Bilateral-No scleral icterus.  Chest and Lung Exam Chest and lung exam reveals -quiet, even and easy respiratory effort with no use of accessory muscles and on auscultation, normal breath sounds, no adventitious sounds and normal vocal resonance. Inspection Chest Wall - Normal. Back - normal.  Breast Breast - Left-Symmetric, Non Tender, No Biopsy scars, no Dimpling - Left, No Inflammation, No Lumpectomy scars, No Mastectomy scars, No Peau d' Orange. Breast - Right-Symmetric, Non Tender, No Biopsy scars, no Dimpling - Right, No Inflammation, No Lumpectomy scars, No Mastectomy scars, No Peau d' Orange. Breast Lump-No Palpable Breast Mass. Note: There are no palpable breast masses.   Cardiovascular Cardiovascular examination  reveals -normal heart sounds, regular rate and rhythm with no murmurs and normal pedal pulses bilaterally.  Abdomen - Did not examine.  Neurologic - Did not examine.  Musculoskeletal - Did not examine.  Lymphatic Head & Neck  General Head & Neck Lymphatics: Bilateral - Description - Normal. Axillary  General Axillary Region: Bilateral - Description - Normal. Tenderness - Non Tender. Note: There is shotty adenopathy in both axilla. Femoral & Inguinal - Did not examine.    Assessment & Plan (Alek Borges A. 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 I gave the patient and her husband copy of the pathology results. We discussed the diagnosis. We discussed surgery for DCIS of the breast. We discussed breast conservation versus mastectomy. She has been researching this extensively. She will go ahead and proceed with a radioactive seed guided left breast lumpectomy. She will be referred to medical and radiation oncology and she has requested genetic counseling as well. We discussed the surgical procedure in detail. We discussed the risk which includes but is not limited to bleeding, infection, the need for further surgery if margins are positive or there is invasive malignancy found. We discussed postoperative recovery as well. She understands and wished to proceed with surgery as soon as possible

## 2019-03-20 ENCOUNTER — Encounter (HOSPITAL_COMMUNITY): Payer: Self-pay

## 2019-03-20 ENCOUNTER — Ambulatory Visit (HOSPITAL_COMMUNITY): Payer: BC Managed Care – PPO | Admitting: Physician Assistant

## 2019-03-20 ENCOUNTER — Ambulatory Visit (HOSPITAL_COMMUNITY): Payer: BC Managed Care – PPO | Admitting: Anesthesiology

## 2019-03-20 ENCOUNTER — Ambulatory Visit
Admission: RE | Admit: 2019-03-20 | Discharge: 2019-03-20 | Disposition: A | Discharge status: Home / Self Care | Payer: BC Managed Care – PPO | Source: Ambulatory Visit | Attending: Surgery | Admitting: Surgery

## 2019-03-20 ENCOUNTER — Encounter (HOSPITAL_COMMUNITY)
Admission: RE | Disposition: A | Discharge status: Home / Self Care | Payer: Self-pay | Source: Home / Self Care | Attending: Surgery

## 2019-03-20 ENCOUNTER — Ambulatory Visit (HOSPITAL_COMMUNITY)
Admission: RE | Admit: 2019-03-20 | Discharge: 2019-03-20 | Disposition: A | Discharge status: Home / Self Care | Payer: BC Managed Care – PPO | Attending: Surgery | Admitting: Surgery

## 2019-03-20 DIAGNOSIS — I10 Essential (primary) hypertension: Secondary | ICD-10-CM | POA: Diagnosis not present

## 2019-03-20 DIAGNOSIS — Z79899 Other long term (current) drug therapy: Secondary | ICD-10-CM | POA: Diagnosis not present

## 2019-03-20 DIAGNOSIS — Z170 Estrogen receptor positive status [ER+]: Secondary | ICD-10-CM | POA: Insufficient documentation

## 2019-03-20 DIAGNOSIS — D0512 Intraductal carcinoma in situ of left breast: Secondary | ICD-10-CM | POA: Insufficient documentation

## 2019-03-20 DIAGNOSIS — Z882 Allergy status to sulfonamides status: Secondary | ICD-10-CM | POA: Insufficient documentation

## 2019-03-20 HISTORY — PX: BREAST LUMPECTOMY: SHX2

## 2019-03-20 HISTORY — PX: BREAST LUMPECTOMY WITH RADIOACTIVE SEED LOCALIZATION: SHX6424

## 2019-03-20 LAB — POCT PREGNANCY, URINE: Preg Test, Ur: NEGATIVE

## 2019-03-20 SURGERY — BREAST LUMPECTOMY WITH RADIOACTIVE SEED LOCALIZATION
Anesthesia: General | Site: Breast | Laterality: Left

## 2019-03-20 MED ORDER — ACETAMINOPHEN 500 MG PO TABS
1000.0000 mg | ORAL_TABLET | ORAL | Status: AC
  Administered 2019-03-20: 1000 mg via ORAL
  Filled 2019-03-20: qty 2

## 2019-03-20 MED ORDER — BUPIVACAINE HCL (PF) 0.25 % IJ SOLN
INTRAMUSCULAR | Status: DC | PRN
  Administered 2019-03-20: 10 mL

## 2019-03-20 MED ORDER — GABAPENTIN 300 MG PO CAPS
300.0000 mg | ORAL_CAPSULE | ORAL | Status: AC
  Administered 2019-03-20: 300 mg via ORAL
  Filled 2019-03-20: qty 1

## 2019-03-20 MED ORDER — MIDAZOLAM HCL 5 MG/5ML IJ SOLN
INTRAMUSCULAR | Status: DC | PRN
  Administered 2019-03-20: 2 mg via INTRAVENOUS

## 2019-03-20 MED ORDER — PROPOFOL 10 MG/ML IV BOLUS
INTRAVENOUS | Status: AC
  Filled 2019-03-20: qty 20

## 2019-03-20 MED ORDER — LIDOCAINE 2% (20 MG/ML) 5 ML SYRINGE
INTRAMUSCULAR | Status: DC | PRN
  Administered 2019-03-20: 100 mg via INTRAVENOUS

## 2019-03-20 MED ORDER — DEXAMETHASONE SODIUM PHOSPHATE 10 MG/ML IJ SOLN
INTRAMUSCULAR | Status: DC | PRN
  Administered 2019-03-20: 10 mg via INTRAVENOUS

## 2019-03-20 MED ORDER — OXYCODONE HCL 5 MG PO TABS: 5.0000 mg | ORAL_TABLET | Freq: Four times a day (QID) | ORAL | 0 refills | Status: DC | PRN

## 2019-03-20 MED ORDER — FENTANYL CITRATE (PF) 250 MCG/5ML IJ SOLN
INTRAMUSCULAR | Status: AC
  Filled 2019-03-20: qty 5

## 2019-03-20 MED ORDER — CHLORHEXIDINE GLUCONATE CLOTH 2 % EX PADS: 6.0000 | MEDICATED_PAD | Freq: Once | CUTANEOUS | Status: DC

## 2019-03-20 MED ORDER — 0.9 % SODIUM CHLORIDE (POUR BTL) OPTIME
TOPICAL | Status: DC | PRN
  Administered 2019-03-20: 10:00:00 1000 mL

## 2019-03-20 MED ORDER — LACTATED RINGERS IV SOLN
INTRAVENOUS | Status: DC | PRN
  Administered 2019-03-20: 10:00:00 via INTRAVENOUS

## 2019-03-20 MED ORDER — CELECOXIB 200 MG PO CAPS
200.0000 mg | ORAL_CAPSULE | ORAL | Status: AC
  Administered 2019-03-20: 09:00:00 200 mg via ORAL
  Filled 2019-03-20: qty 1

## 2019-03-20 MED ORDER — CEFAZOLIN SODIUM-DEXTROSE 2-4 GM/100ML-% IV SOLN
2.0000 g | INTRAVENOUS | Status: AC
  Administered 2019-03-20: 2 g via INTRAVENOUS
  Filled 2019-03-20: qty 100

## 2019-03-20 MED ORDER — BUPIVACAINE HCL (PF) 0.25 % IJ SOLN
INTRAMUSCULAR | Status: AC
  Filled 2019-03-20: qty 30

## 2019-03-20 MED ORDER — FENTANYL CITRATE (PF) 100 MCG/2ML IJ SOLN
INTRAMUSCULAR | Status: DC | PRN
  Administered 2019-03-20 (×2): 50 ug via INTRAVENOUS

## 2019-03-20 MED ORDER — PHENYLEPHRINE HCL-NACL 10-0.9 MG/250ML-% IV SOLN
INTRAVENOUS | Status: DC | PRN
  Administered 2019-03-20: 30 ug/min via INTRAVENOUS

## 2019-03-20 MED ORDER — PROPOFOL 10 MG/ML IV BOLUS
INTRAVENOUS | Status: DC | PRN
  Administered 2019-03-20: 200 mg via INTRAVENOUS

## 2019-03-20 MED ORDER — MIDAZOLAM HCL 2 MG/2ML IJ SOLN
INTRAMUSCULAR | Status: AC
  Filled 2019-03-20: qty 2

## 2019-03-20 MED ORDER — EPHEDRINE SULFATE 50 MG/ML IJ SOLN
INTRAMUSCULAR | Status: DC | PRN
  Administered 2019-03-20: 5 mg via INTRAVENOUS

## 2019-03-20 MED ORDER — ONDANSETRON HCL 4 MG/2ML IJ SOLN
INTRAMUSCULAR | Status: DC | PRN
  Administered 2019-03-20: 4 mg via INTRAVENOUS

## 2019-03-20 SURGICAL SUPPLY — 35 items
ADH SKN CLS APL DERMABOND .7 (GAUZE/BANDAGES/DRESSINGS) ×1
APL PRP STRL LF DISP 70% ISPRP (MISCELLANEOUS) ×1
APPLIER CLIP 9.375 MED OPEN (MISCELLANEOUS) ×3
APR CLP MED 9.3 20 MLT OPN (MISCELLANEOUS) ×1
BINDER BREAST LRG (GAUZE/BANDAGES/DRESSINGS) IMPLANT
BINDER BREAST XLRG (GAUZE/BANDAGES/DRESSINGS) IMPLANT
CANISTER SUCT 3000ML PPV (MISCELLANEOUS) ×3 IMPLANT
CHLORAPREP W/TINT 26 (MISCELLANEOUS) ×3 IMPLANT
CLIP APPLIE 9.375 MED OPEN (MISCELLANEOUS) ×1 IMPLANT
COVER PROBE W GEL 5X96 (DRAPES) ×3 IMPLANT
COVER SURGICAL LIGHT HANDLE (MISCELLANEOUS) ×3 IMPLANT
COVER WAND RF STERILE (DRAPES) ×1 IMPLANT
DERMABOND ADVANCED (GAUZE/BANDAGES/DRESSINGS) ×2
DERMABOND ADVANCED .7 DNX12 (GAUZE/BANDAGES/DRESSINGS) ×1 IMPLANT
DEVICE DUBIN SPECIMEN MAMMOGRA (MISCELLANEOUS) ×3 IMPLANT
DRAPE CHEST BREAST 15X10 FENES (DRAPES) ×3 IMPLANT
ELECT CAUTERY BLADE 6.4 (BLADE) ×3 IMPLANT
ELECT REM PT RETURN 9FT ADLT (ELECTROSURGICAL) ×3
ELECTRODE REM PT RTRN 9FT ADLT (ELECTROSURGICAL) ×1 IMPLANT
GLOVE SURG SIGNA 7.5 PF LTX (GLOVE) ×3 IMPLANT
GOWN STRL REUS W/ TWL LRG LVL3 (GOWN DISPOSABLE) ×1 IMPLANT
GOWN STRL REUS W/ TWL XL LVL3 (GOWN DISPOSABLE) ×1 IMPLANT
GOWN STRL REUS W/TWL LRG LVL3 (GOWN DISPOSABLE) ×3
GOWN STRL REUS W/TWL XL LVL3 (GOWN DISPOSABLE) ×3
KIT BASIN OR (CUSTOM PROCEDURE TRAY) ×3 IMPLANT
KIT MARKER MARGIN INK (KITS) ×3 IMPLANT
NDL HYPO 25GX1X1/2 BEV (NEEDLE) ×1 IMPLANT
NEEDLE HYPO 25GX1X1/2 BEV (NEEDLE) ×3 IMPLANT
NS IRRIG 1000ML POUR BTL (IV SOLUTION) IMPLANT
PACK GENERAL/GYN (CUSTOM PROCEDURE TRAY) ×3 IMPLANT
SUT MNCRL AB 4-0 PS2 18 (SUTURE) ×3 IMPLANT
SUT VIC AB 3-0 SH 18 (SUTURE) ×3 IMPLANT
SYR CONTROL 10ML LL (SYRINGE) ×3 IMPLANT
TOWEL GREEN STERILE (TOWEL DISPOSABLE) ×3 IMPLANT
TOWEL GREEN STERILE FF (TOWEL DISPOSABLE) ×3 IMPLANT

## 2019-03-20 NOTE — Anesthesia Postprocedure Evaluation (Signed)
Anesthesia Post Note  Patient: [name]  Procedure(s) Performed: LEFT BREAST LUMPECTOMY WITH RADIOACTIVE SEED LOCALIZATION (Left Breast)     Patient location during evaluation: PACU Anesthesia Type: General Level of consciousness: awake and alert Pain management: pain level controlled Vital Signs Assessment: post-procedure vital signs reviewed and stable Respiratory status: spontaneous breathing, nonlabored ventilation, respiratory function stable and patient connected to nasal cannula oxygen Cardiovascular status: blood pressure returned to baseline and stable Postop Assessment: no apparent nausea or vomiting Anesthetic complications: no    Last Vitals:  Vitals:   03/20/19 1041 03/20/19 1045  BP: 136/75   Pulse: 93 84  Resp: 14 16  Temp:    SpO2: 99% 99%    Last Pain:  Vitals:   03/20/19 1040  TempSrc:   PainSc: 0-No pain                 Keyry Iracheta DAVID

## 2019-03-20 NOTE — Op Note (Signed)
LEFT BREAST PARTIAL MASTECTOMY WITH RADIOACTIVE SEED LOCALIZATION  Procedure Note  Londen Konkel Tokarz 03/20/2019   Pre-op Diagnosis: DCIS LEFT BREAST     Post-op Diagnosis: SAME  Procedure(s): LEFT BREAST PARTIAL MASTECTOMY WITH RADIOACTIVE SEED LOCALIZATION  Surgeon(s): Coralie Keens, MD  Anesthesia: General  Staff:  Circulator: Carlynn Purl, RN Relief Scrub: Tildon Husky, RN Scrub Person: Dollene Cleveland T  Estimated Blood Loss: Minimal               Specimens: sent to path  Indications: This is a 53 year old female with a recent diagnosis of ductal carcinoma in situ of the left breast.  She now presents for a radioactive seed guided left breast partial mastectomy.  Findings: The radioactive seeds and remaining calcifications were seen to be in the biopsy specimen on x-ray.  The previous tissue marker was not located there and was already known to have migrated 2-1/2 cm away  Procedure: The patient was brought to the operating room and identifies correct patient.  She is placed upon the operating table and general anesthesia was induced.  Her left breast was prepped and draped in usual sterile fashion.  I anesthetized the skin of the upper medial edge of the areola with Marcaine after localized the seed with the neoprobe.  I made incision with a scalpel.  I then dissected down to the breast tissue electrocautery.  I then performed a partial mastectomy removing the tissue underneath the areola in the upper inner quadrant of the breast going down to the chest wall.  I attempted to stay widely around the radioactive seed with the neoprobe.  Once the specimen was removed, I marked all margins with marker paint, and then x-rayed the specimen.  This confirmed that the radioactive seed and previous calcifications were in the specimen.  The previous tissue marker was not in the specimen and again had migrated away.  The specimen was then sent to pathology for evaluation.  I  achieved hemostasis with the cautery.  I placed surgical clips into the biopsy cavity for marker purposes.  I then closed the subtenons tissue with interrupted 3-0 Vicryl sutures and closed the skin with a running 4-0 Monocryl.  Dermabond was then applied.  The patient tolerated the procedure well.  All the counts were correct at the end of the procedure.  The patient was then extubated in the operating and taken in stable addition to the recovery room.          Coralie Keens   Date: 03/20/2019  Time: 10:28 AM

## 2019-03-20 NOTE — Transfer of Care (Signed)
Immediate Anesthesia Transfer of Care Note  Patient: [place]  Procedure(s) Performed: LEFT BREAST LUMPECTOMY WITH RADIOACTIVE SEED LOCALIZATION (Left Breast)  Patient Location: PACU  Anesthesia Type:General  Level of Consciousness: awake, alert  and oriented  Airway & Oxygen Therapy: Patient Spontanous Breathing  Post-op Assessment: Report given to RN and Post -op Vital signs reviewed and stable  Post vital signs: Reviewed and stable  Last Vitals:  Vitals Value Taken Time  BP 136/75 03/20/19 1041  Temp    Pulse 86 03/20/19 1042  Resp 14 03/20/19 1042  SpO2 100 % 03/20/19 1042  Vitals shown include unvalidated device data.  Last Pain:  Vitals:   03/20/19 0905  TempSrc: Oral  PainSc:       Patients Stated Pain Goal: 3 ([id number])  Complications: No apparent anesthesia complications

## 2019-03-20 NOTE — Discharge Instructions (Signed)
[place] Office Phone Number [phone]  BREAST BIOPSY/ PARTIAL MASTECTOMY: POST OP INSTRUCTIONS  Always review your discharge instruction sheet given to you by the facility where your surgery was performed.  IF YOU HAVE DISABILITY OR FAMILY LEAVE FORMS, YOU MUST BRING THEM TO THE OFFICE FOR PROCESSING.  DO NOT GIVE THEM TO YOUR DOCTOR.  1. A prescription for pain medication may be given to you upon discharge.  Take your pain medication as prescribed, if needed.  If narcotic pain medicine is not needed, then you may take acetaminophen (Tylenol) or ibuprofen (Advil) as needed. 2. Take your usually prescribed medications unless otherwise directed 3. If you need a refill on your pain medication, please contact your pharmacy.  They will contact our office to request authorization.  Prescriptions will not be filled after 5pm or on week-ends. 4. You should eat very light the first 24 hours after surgery, such as soup, crackers, pudding, etc.  Resume your normal diet the day after surgery. 5. Most patients will experience some swelling and bruising in the breast.  Ice packs and a good support bra will help.  Swelling and bruising can take several days to resolve.  6. It is common to experience some constipation if taking pain medication after surgery.  Increasing fluid intake and taking a stool softener will usually help or prevent this problem from occurring.  A mild laxative (Milk of Magnesia or Miralax) should be taken according to package directions if there are no bowel movements after 48 hours. 7. Unless discharge instructions indicate otherwise, you may remove your bandages 24-48 hours after surgery, and you may shower at that time.  You may have steri-strips (small skin tapes) in place directly over the incision.  These strips should be left on the skin for 7-10 days.  If your surgeon used skin glue on the incision, you may shower in 24 hours.  The glue will flake off over the  next 2-3 weeks.  Any sutures or staples will be removed at the office during your follow-up visit. 8. ACTIVITIES:  You may resume regular daily activities (gradually increasing) beginning the next day.  Wearing a good support bra or sports bra minimizes pain and swelling.  You may have sexual intercourse when it is comfortable. a. You may drive when you no longer are taking prescription pain medication, you can comfortably wear a seatbelt, and you can safely maneuver your car and apply brakes. b. RETURN TO WORK:  ______________________________________________________________________________________ 9. You should see your doctor in the office for a follow-up appointment approximately two weeks after your surgery.  Your doctors nurse will typically make your follow-up appointment when she calls you with your pathology report.  Expect your pathology report 2-3 business days after your surgery.  You may call to check if you do not hear from [place] after three days. 10. OTHER INSTRUCTIONS: _OK TO SHOWER STARTING TOMORROW 11. ICE PACK, TYLENOL, IBUPROFEN ALSO FOR PAIN 12. NO VIGOROUS ACTIVITY FOR ONE WEEK 13. ______________________________________________________________________________________________ _____________________________________________________________________________________________________________________________________ _____________________________________________________________________________________________________________________________________ _____________________________________________________________________________________________________________________________________  WHEN TO CALL YOUR DOCTOR: 1. Fever over 101.0 2. Nausea and/or vomiting. 3. Extreme swelling or bruising. 4. Continued bleeding from incision. 5. Increased pain, redness, or drainage from the incision.  The clinic staff is available to answer your questions during regular business hours.  Please dont hesitate to  call and ask to speak to one of the nurses for clinical concerns.  If you have a medical emergency, go to the nearest emergency room or call 911.  A surgeon from [facility] is always on call at the hospital.  For further questions, please visit [email or web address]

## 2019-03-20 NOTE — Interval H&P Note (Signed)
History and Physical Interval Note: no change in H and P  03/20/2019 9:21 AM  [place]  has presented today for surgery, with the diagnosis of DCIS LEFT BREAST.  The various methods of treatment have been discussed with the patient and family. After consideration of risks, benefits and other options for treatment, the patient has consented to  Procedure(s): LEFT BREAST LUMPECTOMY WITH RADIOACTIVE SEED LOCALIZATION (Left) as a surgical intervention.  The patient's history has been reviewed, patient examined, no change in status, stable for surgery.  I have reviewed the patient's chart and labs.  Questions were answered to the patient's satisfaction.     Coralie Keens

## 2019-03-20 NOTE — Anesthesia Preprocedure Evaluation (Signed)
Anesthesia Evaluation  Patient identified by MRN, date of birth, ID band Patient awake    Reviewed: Allergy & Precautions, NPO status , Patient's Chart, lab work & pertinent test results  Airway Mallampati: I  TM Distance: >3 FB Neck ROM: Full    Dental   Pulmonary    Pulmonary exam normal        Cardiovascular hypertension, Pt. on medications Normal cardiovascular exam     Neuro/Psych    GI/Hepatic GERD  Medicated and Controlled,  Endo/Other    Renal/GU      Musculoskeletal   Abdominal   Peds  Hematology   Anesthesia Other Findings   Reproductive/Obstetrics                             Anesthesia Physical Anesthesia Plan  ASA: II  Anesthesia Plan: General   Post-op Pain Management:    Induction: Intravenous  PONV Risk Score and Plan: 3 and Ondansetron, Midazolam and Treatment may vary due to age or medical condition  Airway Management Planned: LMA  Additional Equipment:   Intra-op Plan:   Post-operative Plan: Extubation in OR  Informed Consent: I have reviewed the patients History and Physical, chart, labs and discussed the procedure including the risks, benefits and alternatives for the proposed anesthesia with the patient or authorized representative who has indicated his/her understanding and acceptance.       Plan Discussed with: CRNA and Surgeon  Anesthesia Plan Comments:         Anesthesia Quick Evaluation

## 2019-03-20 NOTE — Anesthesia Procedure Notes (Signed)
Procedure Name: LMA Insertion Date/Time: 03/20/2019 9:56 AM Performed by: Marsa Aris, CRNA Pre-anesthesia Checklist: Patient identified, Emergency Drugs available, Suction available and Patient being monitored Patient Re-evaluated:Patient Re-evaluated prior to induction Oxygen Delivery Method: Circle System Utilized Preoxygenation: Pre-oxygenation with 100% oxygen Induction Type: IV induction Ventilation: Mask ventilation without difficulty LMA: LMA inserted LMA Size: 4.0 Number of attempts: 1 Airway Equipment and Method: Bite block Placement Confirmation: positive ETCO2 Tube secured with: Tape Dental Injury: Teeth and Oropharynx as per pre-operative assessment  Comments: No change in dentition from preprocedure

## 2019-03-21 ENCOUNTER — Encounter (HOSPITAL_COMMUNITY): Payer: Self-pay | Admitting: Surgery

## 2019-03-23 ENCOUNTER — Encounter: Payer: Self-pay | Admitting: *Deleted

## 2019-03-23 NOTE — Progress Notes (Signed)
Called patient to follow up after her surgery. She is doing well and seems to be progressing without complication. Her pain is manageable with just Tylenol. She is waiting final pathology which is not yet back.  She mentioned that she is currently trying to decide on insurance coverage for next year. Encouraged that she use all resources provided to her by her employer to ensure that she chooses the best option for her situation. She is actively researching her choices.  She knows she can call with any questions or concerns, otherwise will follow up with her at her appointment on 04/23/2019.

## 2019-03-27 LAB — SURGICAL PATHOLOGY

## 2019-03-29 ENCOUNTER — Other Ambulatory Visit: Payer: Self-pay | Admitting: Surgery

## 2019-04-03 ENCOUNTER — Other Ambulatory Visit (HOSPITAL_COMMUNITY)
Admission: RE | Admit: 2019-04-03 | Discharge: 2019-04-03 | Disposition: A | Discharge status: Home / Self Care | Payer: BC Managed Care – PPO | Source: Ambulatory Visit | Attending: Surgery | Admitting: Surgery

## 2019-04-03 DIAGNOSIS — Z01812 Encounter for preprocedural laboratory examination: Secondary | ICD-10-CM | POA: Insufficient documentation

## 2019-04-03 DIAGNOSIS — Z20828 Contact with and (suspected) exposure to other viral communicable diseases: Secondary | ICD-10-CM | POA: Diagnosis not present

## 2019-04-04 LAB — NOVEL CORONAVIRUS, NAA (HOSP ORDER, SEND-OUT TO REF LAB; TAT 18-24 HRS): SARS-CoV-2, NAA: NOT DETECTED

## 2019-04-05 ENCOUNTER — Encounter (HOSPITAL_COMMUNITY): Payer: Self-pay | Admitting: *Deleted

## 2019-04-05 ENCOUNTER — Other Ambulatory Visit: Payer: Self-pay

## 2019-04-05 NOTE — H&P (Signed)
[name] is an 53 y.o. female.   Chief Complaint: left breast DCIS HPI: She is status post a left breast radioactive seed guided lumpectomy for DCIS.  The anterior margin was focally positive so the decision has been made to reexcise this area.  No invasive malignancy was seen on the original lumpectomy and she is 100% ER/PR positive.  She is otherwise without complaints.  Past Medical History:  Diagnosis Date  . Abnormal Pap smear 7/13   ASCUS/positive HR HPV, negative colpo  . Anemia 2006  . Cancer ([place])   . 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 Family history of cancer   . Fibroids    with enlarged uterus  . GERD (gastroesophageal reflux disease)   . Hypertension   . Pulmonary embolism ([place]) 2008   secondary to [facility]    Past Surgical History:  Procedure Laterality Date  . BREAST LUMPECTOMY WITH RADIOACTIVE SEED LOCALIZATION Left 03/20/2019   Procedure: LEFT BREAST LUMPECTOMY WITH RADIOACTIVE SEED LOCALIZATION;  Surgeon: Coralie Keens, MD;  Location: [facility];  Service: General;  Laterality: Left;  . IR GENERIC HISTORICAL  03/31/2016   IR RADIOLOGIST EVAL & MGMT 03/31/2016 Marybelle Killings, MD [facility] INTERV RAD  . IR GENERIC HISTORICAL  01/06/2016   IR RADIOLOGIST EVAL & MGMT 01/06/2016 Marybelle Killings, MD [facility] INTERV RAD  . left finger reattachment  age 66  . LEFT OOPHORECTOMY Left 1998      . MYOMECTOMY  2003  . MYOMECTOMY  2008   via laparoscopy    Family History  Problem Relation Age of Onset  . Diabetes Mother   . Hypertension Mother   . Heart disease Mother        cardiomegaly  . Lupus Mother   . Diabetes Father   . Hypertension Father   . Stroke Father   . Hypertension Maternal Grandmother   . Stroke Maternal Grandmother   . Hypertension Sister   . Seizures Sister   . COPD Sister   . Ovarian cancer Paternal Aunt    Social History:  reports that she has never smoked. She has never used smokeless tobacco. She reports current alcohol use. She reports that  she does not use drugs.  Allergies:  Allergies  Allergen Reactions  . Minocycline Hcl Hives and Itching  . Sulfa Antibiotics Swelling    Swell of the lips     No medications prior to admission.    No results found for this or any previous visit (from the past 48 hour(s)). No results found.  Review of Systems  All other systems reviewed and are negative.   There were no vitals taken for this visit. Physical Exam  Constitutional: She is oriented to person, place, and time. She appears well-developed and well-nourished. No distress.  HENT:  Head: Normocephalic and atraumatic.  Eyes: Pupils are equal, round, and reactive to light.  Neck: Normal range of motion. No tracheal deviation present.  Cardiovascular: Normal rate, regular rhythm and normal heart sounds.  Respiratory: Effort normal and breath sounds normal.  Left breast incision is healing well  GI: Soft. Bowel sounds are normal. There is no abdominal tenderness.  Musculoskeletal: Normal range of motion.  Neurological: She is alert and oriented to person, place, and time.  Skin: Skin is warm and dry.  Psychiatric: Her behavior is normal. Judgment normal.     Assessment/Plan Ductal carcinoma in situ of the left breast status post radioactive seed guided left breast lumpectomy with a focally positive anterior margin  Plan will be to proceed to the operating room for reexcision of the anterior margin to achieve negative margins.  I discussed the risks with her in detail.  These include but are not limited to bleeding, infection, the need for further surgery of malignancy again is found, cardiopulmonary issues, postoperative recovery, etc.  She understands and agrees to proceed with surgery  Coralie Keens, MD 04/05/2019, 3:08 PM

## 2019-04-06 ENCOUNTER — Encounter (HOSPITAL_COMMUNITY)
Admission: RE | Disposition: A | Discharge status: Home / Self Care | Payer: Self-pay | Source: Home / Self Care | Attending: Surgery

## 2019-04-06 ENCOUNTER — Ambulatory Visit (HOSPITAL_COMMUNITY)
Admission: RE | Admit: 2019-04-06 | Discharge: 2019-04-06 | Disposition: A | Discharge status: Home / Self Care | Payer: BC Managed Care – PPO | Attending: Surgery | Admitting: Surgery

## 2019-04-06 ENCOUNTER — Ambulatory Visit (HOSPITAL_COMMUNITY): Payer: BC Managed Care – PPO | Admitting: Anesthesiology

## 2019-04-06 ENCOUNTER — Encounter (HOSPITAL_COMMUNITY): Payer: Self-pay | Admitting: Critical Care Medicine

## 2019-04-06 ENCOUNTER — Other Ambulatory Visit: Payer: Self-pay

## 2019-04-06 DIAGNOSIS — Z820 Family history of epilepsy and other diseases of the nervous system: Secondary | ICD-10-CM | POA: Diagnosis not present

## 2019-04-06 DIAGNOSIS — Z809 Family history of malignant neoplasm, unspecified: Secondary | ICD-10-CM | POA: Insufficient documentation

## 2019-04-06 DIAGNOSIS — E669 Obesity, unspecified: Secondary | ICD-10-CM | POA: Insufficient documentation

## 2019-04-06 DIAGNOSIS — Z8041 Family history of malignant neoplasm of ovary: Secondary | ICD-10-CM | POA: Diagnosis not present

## 2019-04-06 DIAGNOSIS — D649 Anemia, unspecified: Secondary | ICD-10-CM | POA: Insufficient documentation

## 2019-04-06 DIAGNOSIS — Z825 Family history of asthma and other chronic lower respiratory diseases: Secondary | ICD-10-CM | POA: Diagnosis not present

## 2019-04-06 DIAGNOSIS — Z170 Estrogen receptor positive status [ER+]: Secondary | ICD-10-CM | POA: Insufficient documentation

## 2019-04-06 DIAGNOSIS — Z8249 Family history of ischemic heart disease and other diseases of the circulatory system: Secondary | ICD-10-CM | POA: Diagnosis not present

## 2019-04-06 DIAGNOSIS — Z86711 Personal history of pulmonary embolism: Secondary | ICD-10-CM | POA: Diagnosis not present

## 2019-04-06 DIAGNOSIS — Z882 Allergy status to sulfonamides status: Secondary | ICD-10-CM | POA: Insufficient documentation

## 2019-04-06 DIAGNOSIS — Z6831 Body mass index (BMI) 31.0-31.9, adult: Secondary | ICD-10-CM | POA: Insufficient documentation

## 2019-04-06 DIAGNOSIS — D0512 Intraductal carcinoma in situ of left breast: Secondary | ICD-10-CM | POA: Insufficient documentation

## 2019-04-06 DIAGNOSIS — Z8489 Family history of other specified conditions: Secondary | ICD-10-CM | POA: Diagnosis not present

## 2019-04-06 DIAGNOSIS — Z90721 Acquired absence of ovaries, unilateral: Secondary | ICD-10-CM | POA: Insufficient documentation

## 2019-04-06 DIAGNOSIS — I10 Essential (primary) hypertension: Secondary | ICD-10-CM | POA: Diagnosis not present

## 2019-04-06 DIAGNOSIS — Z823 Family history of stroke: Secondary | ICD-10-CM | POA: Diagnosis not present

## 2019-04-06 DIAGNOSIS — Z833 Family history of diabetes mellitus: Secondary | ICD-10-CM | POA: Insufficient documentation

## 2019-04-06 DIAGNOSIS — K219 Gastro-esophageal reflux disease without esophagitis: Secondary | ICD-10-CM | POA: Insufficient documentation

## 2019-04-06 HISTORY — PX: RE-EXCISION OF BREAST CANCER,SUPERIOR MARGINS: SHX6047

## 2019-04-06 HISTORY — DX: Esophageal varices without bleeding: I85.00

## 2019-04-06 LAB — CBC
HCT: 39.5 % (ref 36.0–46.0)
Hemoglobin: 12.3 g/dL (ref 12.0–15.0)
MCH: 29 pg (ref 26.0–34.0)
MCHC: 31.1 g/dL (ref 30.0–36.0)
MCV: 93.2 fL (ref 80.0–100.0)
Platelets: 291 10*3/uL (ref 150–400)
RBC: 4.24 MIL/uL (ref 3.87–5.11)
RDW: 13.4 % (ref 11.5–15.5)
WBC: 4.6 10*3/uL (ref 4.0–10.5)
nRBC: 0 % (ref 0.0–0.2)

## 2019-04-06 LAB — POCT PREGNANCY, URINE: Preg Test, Ur: NEGATIVE

## 2019-04-06 LAB — BASIC METABOLIC PANEL
Anion gap: 10 (ref 5–15)
BUN: 9 mg/dL (ref 6–20)
CO2: 25 mmol/L (ref 22–32)
Calcium: 9.4 mg/dL (ref 8.9–10.3)
Chloride: 101 mmol/L (ref 98–111)
Creatinine, Ser: 0.84 mg/dL (ref 0.44–1.00)
GFR calc Af Amer: >60 mL/min (ref >60)
GFR calc non Af Amer: >60 mL/min (ref >60)
Glucose, Bld: 96 mg/dL (ref 70–99)
Potassium: 3.7 mmol/L (ref 3.5–5.1)
Sodium: 136 mmol/L (ref 135–145)

## 2019-04-06 SURGERY — RE-EXCISION OF BREAST CANCER,SUPERIOR MARGINS
Anesthesia: General | Site: Breast | Laterality: Left

## 2019-04-06 MED ORDER — PROPOFOL 10 MG/ML IV BOLUS
INTRAVENOUS | Status: DC | PRN
  Administered 2019-04-06: 140 mg via INTRAVENOUS

## 2019-04-06 MED ORDER — EPHEDRINE SULFATE-NACL 50-0.9 MG/10ML-% IV SOSY
PREFILLED_SYRINGE | INTRAVENOUS | Status: DC | PRN
  Administered 2019-04-06 (×2): 5 mg via INTRAVENOUS

## 2019-04-06 MED ORDER — PROPOFOL 10 MG/ML IV BOLUS
INTRAVENOUS | Status: AC
  Filled 2019-04-06: qty 20

## 2019-04-06 MED ORDER — LIDOCAINE-EPINEPHRINE 1 %-1:100000 IJ SOLN
INTRAMUSCULAR | Status: AC
  Filled 2019-04-06: qty 1

## 2019-04-06 MED ORDER — HYDROMORPHONE HCL 1 MG/ML IJ SOLN: 0.2500 mg | INTRAMUSCULAR | Status: DC | PRN

## 2019-04-06 MED ORDER — OXYCODONE HCL 5 MG PO TABS: 5.0000 mg | ORAL_TABLET | Freq: Once | ORAL | Status: DC | PRN

## 2019-04-06 MED ORDER — KETOROLAC TROMETHAMINE 30 MG/ML IJ SOLN: 30.0000 mg | Freq: Once | INTRAMUSCULAR | Status: DC | PRN

## 2019-04-06 MED ORDER — ONDANSETRON HCL 4 MG/2ML IJ SOLN
INTRAMUSCULAR | Status: DC | PRN
  Administered 2019-04-06: 4 mg via INTRAVENOUS

## 2019-04-06 MED ORDER — MIDAZOLAM HCL 2 MG/2ML IJ SOLN
INTRAMUSCULAR | Status: AC
  Filled 2019-04-06: qty 2

## 2019-04-06 MED ORDER — ONDANSETRON HCL 4 MG/2ML IJ SOLN: 4.0000 mg | Freq: Once | INTRAMUSCULAR | Status: DC | PRN

## 2019-04-06 MED ORDER — BUPIVACAINE HCL 0.25 % IJ SOLN
INTRAMUSCULAR | Status: DC | PRN
  Administered 2019-04-06: 10 mL

## 2019-04-06 MED ORDER — GABAPENTIN 300 MG PO CAPS
300.0000 mg | ORAL_CAPSULE | ORAL | Status: AC
  Administered 2019-04-06: 300 mg via ORAL
  Filled 2019-04-06: qty 1

## 2019-04-06 MED ORDER — CHLORHEXIDINE GLUCONATE CLOTH 2 % EX PADS: 6.0000 | MEDICATED_PAD | Freq: Once | CUTANEOUS | Status: DC

## 2019-04-06 MED ORDER — CEFAZOLIN SODIUM-DEXTROSE 2-4 GM/100ML-% IV SOLN
2.0000 g | INTRAVENOUS | Status: AC
  Administered 2019-04-06: 09:00:00 2 g via INTRAVENOUS
  Filled 2019-04-06: qty 100

## 2019-04-06 MED ORDER — LACTATED RINGERS IV SOLN
INTRAVENOUS | Status: DC
  Administered 2019-04-06: 08:00:00 via INTRAVENOUS

## 2019-04-06 MED ORDER — FENTANYL CITRATE (PF) 250 MCG/5ML IJ SOLN
INTRAMUSCULAR | Status: DC | PRN
  Administered 2019-04-06 (×2): 50 ug via INTRAVENOUS

## 2019-04-06 MED ORDER — OXYCODONE HCL 5 MG/5ML PO SOLN: 5.0000 mg | Freq: Once | ORAL | Status: DC | PRN

## 2019-04-06 MED ORDER — BUPIVACAINE HCL (PF) 0.25 % IJ SOLN
INTRAMUSCULAR | Status: AC
  Filled 2019-04-06: qty 10

## 2019-04-06 MED ORDER — MIDAZOLAM HCL 5 MG/5ML IJ SOLN
INTRAMUSCULAR | Status: DC | PRN
  Administered 2019-04-06: 2 mg via INTRAVENOUS

## 2019-04-06 MED ORDER — ACETAMINOPHEN 500 MG PO TABS
1000.0000 mg | ORAL_TABLET | ORAL | Status: AC
  Administered 2019-04-06: 1000 mg via ORAL
  Filled 2019-04-06: qty 2

## 2019-04-06 MED ORDER — LIDOCAINE 2% (20 MG/ML) 5 ML SYRINGE
INTRAMUSCULAR | Status: DC | PRN
  Administered 2019-04-06: 100 mg via INTRAVENOUS

## 2019-04-06 MED ORDER — FENTANYL CITRATE (PF) 250 MCG/5ML IJ SOLN
INTRAMUSCULAR | Status: AC
  Filled 2019-04-06: qty 5

## 2019-04-06 MED ORDER — DEXAMETHASONE SODIUM PHOSPHATE 10 MG/ML IJ SOLN
INTRAMUSCULAR | Status: DC | PRN
  Administered 2019-04-06: 10 mg via INTRAVENOUS

## 2019-04-06 SURGICAL SUPPLY — 38 items
ADH SKN CLS APL DERMABOND .7 (GAUZE/BANDAGES/DRESSINGS) ×1
ADH SKN CLS LQ APL DERMABOND (GAUZE/BANDAGES/DRESSINGS) ×1
APL PRP STRL LF DISP 70% ISPRP (MISCELLANEOUS) ×1
CANISTER SUCT 3000ML PPV (MISCELLANEOUS) IMPLANT
CHLORAPREP W/TINT 26 (MISCELLANEOUS) ×2 IMPLANT
CONT SPEC 4OZ CLIKSEAL STRL BL (MISCELLANEOUS) ×2 IMPLANT
COVER SURGICAL LIGHT HANDLE (MISCELLANEOUS) ×2 IMPLANT
COVER WAND RF STERILE (DRAPES) ×2 IMPLANT
DECANTER SPIKE VIAL GLASS SM (MISCELLANEOUS) ×2 IMPLANT
DERMABOND ADHESIVE PROPEN (GAUZE/BANDAGES/DRESSINGS) ×1
DERMABOND ADVANCED (GAUZE/BANDAGES/DRESSINGS) ×1
DERMABOND ADVANCED .7 DNX12 (GAUZE/BANDAGES/DRESSINGS) ×1 IMPLANT
DERMABOND ADVANCED .7 DNX6 (GAUZE/BANDAGES/DRESSINGS) IMPLANT
DRAPE CHEST BREAST 15X10 FENES (DRAPES) ×2 IMPLANT
DRSG TEGADERM 4X4.75 (GAUZE/BANDAGES/DRESSINGS) ×2 IMPLANT
ELECT REM PT RETURN 9FT ADLT (ELECTROSURGICAL) ×2
ELECTRODE REM PT RTRN 9FT ADLT (ELECTROSURGICAL) ×1 IMPLANT
GAUZE SPONGE 4X4 12PLY STRL (GAUZE/BANDAGES/DRESSINGS) ×2 IMPLANT
GLOVE SURG SIGNA 7.5 PF LTX (GLOVE) ×2 IMPLANT
GOWN STRL REUS W/ TWL LRG LVL3 (GOWN DISPOSABLE) ×1 IMPLANT
GOWN STRL REUS W/ TWL XL LVL3 (GOWN DISPOSABLE) ×1 IMPLANT
GOWN STRL REUS W/TWL LRG LVL3 (GOWN DISPOSABLE) ×2
GOWN STRL REUS W/TWL XL LVL3 (GOWN DISPOSABLE) ×2
KIT BASIN OR (CUSTOM PROCEDURE TRAY) ×2 IMPLANT
KIT TURNOVER KIT B (KITS) ×2 IMPLANT
NDL HYPO 25GX1X1/2 BEV (NEEDLE) ×1 IMPLANT
NEEDLE HYPO 25GX1X1/2 BEV (NEEDLE) ×2 IMPLANT
NS IRRIG 1000ML POUR BTL (IV SOLUTION) ×2 IMPLANT
PACK GENERAL/GYN (CUSTOM PROCEDURE TRAY) ×2 IMPLANT
PAD ARMBOARD 7.5X6 YLW CONV (MISCELLANEOUS) ×2 IMPLANT
PENCIL SMOKE EVACUATOR (MISCELLANEOUS) ×2 IMPLANT
SPONGE LAP 4X18 RFD (DISPOSABLE) ×2 IMPLANT
SUT MNCRL AB 4-0 PS2 18 (SUTURE) ×2 IMPLANT
SUT VIC AB 3-0 SH 27 (SUTURE) ×2
SUT VIC AB 3-0 SH 27X BRD (SUTURE) ×1 IMPLANT
SYR CONTROL 10ML LL (SYRINGE) ×2 IMPLANT
TOWEL GREEN STERILE (TOWEL DISPOSABLE) ×2 IMPLANT
TOWEL GREEN STERILE FF (TOWEL DISPOSABLE) ×2 IMPLANT

## 2019-04-06 NOTE — Transfer of Care (Signed)
Immediate Anesthesia Transfer of Care Note  Patient: [place]  Procedure(s) Performed: RE-EXCISION OF LEFT BREAST CANCER MARGINS (Left Breast)  Patient Location: PACU  Anesthesia Type:General  Level of Consciousness: awake, oriented and patient cooperative  Airway & Oxygen Therapy: Patient Spontanous Breathing and Patient connected to face mask oxygen  Post-op Assessment: Report given to RN and Post -op Vital signs reviewed and stable  Post vital signs: Reviewed and stable  Last Vitals:  Vitals Value Taken Time  BP    Temp    Pulse 92 04/06/19 0958  Resp    SpO2 100 % 04/06/19 0958  Vitals shown include unvalidated device data.  Last Pain:  Vitals:   04/06/19 0731  TempSrc: Oral  PainSc: 0-No pain         Complications: No apparent anesthesia complications

## 2019-04-06 NOTE — Anesthesia Preprocedure Evaluation (Addendum)
Anesthesia Evaluation  Patient identified by MRN, date of birth, ID band Patient awake    Reviewed: Allergy & Precautions, NPO status , Patient's Chart, lab work & pertinent test results  Airway Mallampati: II  TM Distance: >3 FB Neck ROM: Full    Dental no notable dental hx. (+) Teeth Intact   Pulmonary neg pulmonary ROS,    Pulmonary exam normal breath sounds clear to auscultation       Cardiovascular hypertension, Pt. on medications Normal cardiovascular exam Rhythm:Regular Rate:Normal     Neuro/Psych negative neurological ROS  negative psych ROS   GI/Hepatic GERD  ,  Endo/Other  negative endocrine ROS  Renal/GU K+ 3.6 Cr 0.74     Musculoskeletal negative musculoskeletal ROS (+)   Abdominal (+) + obese,   Peds  Hematology Hgb 12.3   Anesthesia Other Findings   Reproductive/Obstetrics                           Anesthesia Physical Anesthesia Plan  ASA: II  Anesthesia Plan: MAC   Post-op Pain Management:    Induction: Intravenous  PONV Risk Score and Plan: 4 or greater and Treatment may vary due to age or medical condition, Ondansetron, Dexamethasone, Scopolamine patch - Pre-op and Midazolam  Airway Management Planned: LMA  Additional Equipment:   Intra-op Plan:   Post-operative Plan:   Informed Consent: I have reviewed the patients History and Physical, chart, labs and discussed the procedure including the risks, benefits and alternatives for the proposed anesthesia with the patient or authorized representative who has indicated his/her understanding and acceptance.     Dental advisory given  Plan Discussed with: CRNA  Anesthesia Plan Comments:        Anesthesia Quick Evaluation

## 2019-04-06 NOTE — Op Note (Signed)
   [name] 04/06/2019   Pre-op Diagnosis: LEFT BREAST DCIS POSITIVE MARGIN     Post-op Diagnosis: same  Procedure(s): RE-EXCISION OF LEFT BREAST DCIS MARGINS  Surgeon(s): Coralie Keens, MD  Anesthesia: General  Staff:  Circulator: Dorice Lamas, RN Relief Scrub: Ladon Applebaum, RN Scrub Person: Kennieth Francois, RN  Estimated Blood Loss: Minimal               Specimens: sent to path  Indications: This is a 53 year old female who had undergone a radioactive seed guided left breast partial mastectomy for DCIS.  She had a focally positive positive margin so the decision has been made to reexcised the margin.  Procedure: The patient was brought to the operating room and identified the correct patient.  She is placed upon the operating table and anesthesia was induced.  Her left breast was prepped and draped in usual sterile fashion.  I anesthetized skin surrounding the old incision with Marcaine.  I then opened up the old incision with a scalpel.  I then excised the anterior margin all the way down to the previous resection cavity with the cautery and sent this complete anterior margin to pathology.  Hemostasis appeared to be achieved.  I anesthetized the incision further with Marcaine.  I then closed the previous cavity with 3-0 Vicryl sutures.  I then closed subcutaneous tissue with 3-0 Vicryl sutures and closed the skin with a running 4-0 Monocryl.  Dermabond was again applied.  The patient tolerated procedure well.  All the counts were correct at the end of the procedure.  The patient was then extubated in the operating room and taken in stable condition to the recovery room.          Coralie Keens   Date: 04/06/2019  Time: 9:49 AM

## 2019-04-06 NOTE — Interval H&P Note (Signed)
History and Physical Interval Note: no change in H and P  04/06/2019 7:17 AM  Meta  has presented today for surgery, with the diagnosis of LEFT BREAST DCIS POSITIVE MARGIN.  The various methods of treatment have been discussed with the patient and family. After consideration of risks, benefits and other options for treatment, the patient has consented to  Procedure(s): RE-EXCISION OF LEFT BREAST CANCER MARGINS (Left) as a surgical intervention.  The patient's history has been reviewed, patient examined, no change in status, stable for surgery.  I have reviewed the patient's chart and labs.  Questions were answered to the patient's satisfaction.     [name]

## 2019-04-06 NOTE — Anesthesia Procedure Notes (Signed)
Procedure Name: LMA Insertion Date/Time: 04/06/2019 9:21 AM Performed by: Lowella Dell, CRNA Pre-anesthesia Checklist: Patient identified, Emergency Drugs available, Suction available and Patient being monitored Patient Re-evaluated:Patient Re-evaluated prior to induction Oxygen Delivery Method: Circle System Utilized Preoxygenation: Pre-oxygenation with 100% oxygen Induction Type: IV induction Ventilation: Mask ventilation without difficulty LMA: LMA inserted LMA Size: 4.0 Number of attempts: 1 Placement Confirmation: positive ETCO2 Tube secured with: Tape Dental Injury: Teeth and Oropharynx as per pre-operative assessment

## 2019-04-06 NOTE — Discharge Instructions (Signed)
[facility] °Office Phone Number [phone] ° °BREAST BIOPSY/ PARTIAL MASTECTOMY: POST OP INSTRUCTIONS ° °Always review your discharge instruction sheet given to you by the facility where your surgery was performed. ° °IF YOU HAVE DISABILITY OR FAMILY LEAVE FORMS, YOU MUST BRING THEM TO THE OFFICE FOR PROCESSING.  DO NOT GIVE THEM TO YOUR DOCTOR. ° °1. A prescription for pain medication may be given to you upon discharge.  Take your pain medication as prescribed, if needed.  If narcotic pain medicine is not needed, then you may take acetaminophen (Tylenol) or ibuprofen (Advil) as needed. °2. Take your usually prescribed medications unless otherwise directed °3. If you need a refill on your pain medication, please contact your pharmacy.  They will contact our office to request authorization.  Prescriptions will not be filled after 5pm or on week-ends. °4. You should eat very light the first 24 hours after surgery, such as soup, crackers, pudding, etc.  Resume your normal diet the day after surgery. °5. Most patients will experience some swelling and bruising in the breast.  Ice packs and a good support bra will help.  Swelling and bruising can take several days to resolve.  °6. It is common to experience some constipation if taking pain medication after surgery.  Increasing fluid intake and taking a stool softener will usually help or prevent this problem from occurring.  A mild laxative (Milk of Magnesia or Miralax) should be taken according to package directions if there are no bowel movements after 48 hours. °7. Unless discharge instructions indicate otherwise, you may remove your bandages 24-48 hours after surgery, and you may shower at that time.  You may have steri-strips (small skin tapes) in place directly over the incision.  These strips should be left on the skin for 7-10 days.  If your surgeon used skin glue on the incision, you may shower in 24 hours.  The glue will flake off over the  next 2-3 weeks.  Any sutures or staples will be removed at the office during your follow-up visit. °8. ACTIVITIES:  You may resume regular daily activities (gradually increasing) beginning the next day.  Wearing a good support bra or sports bra minimizes pain and swelling.  You may have sexual intercourse when it is comfortable. °a. You may drive when you no longer are taking prescription pain medication, you can comfortably wear a seatbelt, and you can safely maneuver your car and apply brakes. °b. RETURN TO WORK:  ______________________________________________________________________________________ °9. You should see your doctor in the office for a follow-up appointment approximately two weeks after your surgery.  Your doctor’s nurse will typically make your follow-up appointment when she calls you with your pathology report.  Expect your pathology report 2-3 business days after your surgery.  You may call to check if you do not hear from us after three days. °10. OTHER INSTRUCTIONS: _______________________________________________________________________________________________ _____________________________________________________________________________________________________________________________________ °_____________________________________________________________________________________________________________________________________ °_____________________________________________________________________________________________________________________________________ ° °WHEN TO CALL YOUR DOCTOR: °1. Fever over 101.0 °2. Nausea and/or vomiting. °3. Extreme swelling or bruising. °4. Continued bleeding from incision. °5. Increased pain, redness, or drainage from the incision. ° °The clinic staff is available to answer your questions during regular business hours.  Please don’t hesitate to call and ask to speak to one of the nurses for clinical concerns.  If you have a medical emergency, go to the nearest  emergency room or call 911.  A surgeon from [facility] is always on call at the hospital. ° °For further questions, please visit [email or web address]  °

## 2019-04-06 NOTE — Anesthesia Postprocedure Evaluation (Signed)
Anesthesia Post Note  Patient: [name]  Procedure(s) Performed: RE-EXCISION OF LEFT BREAST CANCER MARGINS (Left Breast)     Patient location during evaluation: PACU Anesthesia Type: General Level of consciousness: awake and alert Pain management: pain level controlled Vital Signs Assessment: post-procedure vital signs reviewed and stable Respiratory status: spontaneous breathing, nonlabored ventilation, respiratory function stable and patient connected to nasal cannula oxygen Cardiovascular status: blood pressure returned to baseline and stable Postop Assessment: no apparent nausea or vomiting Anesthetic complications: no    Last Vitals:  Vitals:   04/06/19 0731 04/06/19 1000  BP: 139/71 137/78  Pulse: 71 (!) 104  Resp:  (!) 49  Temp: 36.6 C 36.4 C  SpO2: 100% 100%    Last Pain:  Vitals:   04/06/19 1000  TempSrc:   PainSc: 0-No pain                 Barnet Glasgow

## 2019-04-07 ENCOUNTER — Encounter (HOSPITAL_COMMUNITY): Payer: Self-pay | Admitting: Surgery

## 2019-04-09 ENCOUNTER — Ambulatory Visit: Payer: BC Managed Care – PPO | Admitting: Radiation Oncology

## 2019-04-09 ENCOUNTER — Ambulatory Visit: Payer: BC Managed Care – PPO

## 2019-04-09 LAB — SURGICAL PATHOLOGY

## 2019-04-23 ENCOUNTER — Inpatient Hospital Stay: Payer: BC Managed Care – PPO | Attending: Genetic Counselor | Admitting: Hematology & Oncology

## 2019-04-23 ENCOUNTER — Encounter: Payer: Self-pay | Admitting: *Deleted

## 2019-04-23 ENCOUNTER — Telehealth: Payer: Self-pay | Admitting: *Deleted

## 2019-04-23 ENCOUNTER — Encounter: Payer: Self-pay | Admitting: Hematology & Oncology

## 2019-04-23 ENCOUNTER — Inpatient Hospital Stay: Payer: BC Managed Care – PPO

## 2019-04-23 ENCOUNTER — Other Ambulatory Visit: Payer: Self-pay

## 2019-04-23 VITALS — BP 132/63 | HR 78 | Temp 97.5°F | Resp 18 | Wt 196.4 lb

## 2019-04-23 DIAGNOSIS — Z79899 Other long term (current) drug therapy: Secondary | ICD-10-CM | POA: Diagnosis not present

## 2019-04-23 DIAGNOSIS — D500 Iron deficiency anemia secondary to blood loss (chronic): Secondary | ICD-10-CM | POA: Diagnosis not present

## 2019-04-23 DIAGNOSIS — C50212 Malignant neoplasm of upper-inner quadrant of left female breast: Secondary | ICD-10-CM | POA: Diagnosis not present

## 2019-04-23 DIAGNOSIS — Z86711 Personal history of pulmonary embolism: Secondary | ICD-10-CM | POA: Insufficient documentation

## 2019-04-23 DIAGNOSIS — N921 Excessive and frequent menstruation with irregular cycle: Secondary | ICD-10-CM | POA: Diagnosis not present

## 2019-04-23 DIAGNOSIS — Z7901 Long term (current) use of anticoagulants: Secondary | ICD-10-CM | POA: Insufficient documentation

## 2019-04-23 DIAGNOSIS — Z170 Estrogen receptor positive status [ER+]: Secondary | ICD-10-CM | POA: Diagnosis not present

## 2019-04-23 DIAGNOSIS — D0512 Intraductal carcinoma in situ of left breast: Secondary | ICD-10-CM | POA: Diagnosis present

## 2019-04-23 DIAGNOSIS — Z791 Long term (current) use of non-steroidal anti-inflammatories (NSAID): Secondary | ICD-10-CM | POA: Insufficient documentation

## 2019-04-23 LAB — IRON AND TIBC
Iron: 68 ug/dL (ref 41–142)
Saturation Ratios: 27 % (ref 21–57)
TIBC: 254 ug/dL (ref 236–444)
UIBC: 186 ug/dL (ref 120–384)

## 2019-04-23 LAB — CBC WITH DIFFERENTIAL (CANCER CENTER ONLY)
Abs Immature Granulocytes: 0.14 10*3/uL — ABNORMAL HIGH (ref 0.00–0.07)
Basophils Absolute: 0.1 10*3/uL (ref 0.0–0.1)
Basophils Relative: 1 %
Eosinophils Absolute: 0.2 10*3/uL (ref 0.0–0.5)
Eosinophils Relative: 3 %
HCT: 36.7 % (ref 36.0–46.0)
Hemoglobin: 11.6 g/dL — ABNORMAL LOW (ref 12.0–15.0)
Immature Granulocytes: 2 %
Lymphocytes Relative: 30 %
Lymphs Abs: 1.9 10*3/uL (ref 0.7–4.0)
MCH: 28.6 pg (ref 26.0–34.0)
MCHC: 31.6 g/dL (ref 30.0–36.0)
MCV: 90.4 fL (ref 80.0–100.0)
Monocytes Absolute: 0.4 10*3/uL (ref 0.1–1.0)
Monocytes Relative: 7 %
Neutro Abs: 3.6 10*3/uL (ref 1.7–7.7)
Neutrophils Relative %: 57 %
Platelet Count: 270 10*3/uL (ref 150–400)
RBC: 4.06 MIL/uL (ref 3.87–5.11)
RDW: 13.5 % (ref 11.5–15.5)
WBC Count: 6.3 10*3/uL (ref 4.0–10.5)
nRBC: 0 % (ref 0.0–0.2)

## 2019-04-23 LAB — CMP (CANCER CENTER ONLY)
ALT: 16 U/L (ref 0–44)
AST: 13 U/L — ABNORMAL LOW (ref 15–41)
Albumin: 4.1 g/dL (ref 3.5–5.0)
Alkaline Phosphatase: 72 U/L (ref 38–126)
Anion gap: 7 (ref 5–15)
BUN: 12 mg/dL (ref 6–20)
CO2: 27 mmol/L (ref 22–32)
Calcium: 9.4 mg/dL (ref 8.9–10.3)
Chloride: 102 mmol/L (ref 98–111)
Creatinine: 0.7 mg/dL (ref 0.44–1.00)
GFR, Est AFR Am: >60 mL/min (ref >60)
GFR, Estimated: >60 mL/min (ref >60)
Glucose, Bld: 105 mg/dL — ABNORMAL HIGH (ref 70–99)
Potassium: 3.6 mmol/L (ref 3.5–5.1)
Sodium: 136 mmol/L (ref 135–145)
Total Bilirubin: 0.4 mg/dL (ref 0.3–1.2)
Total Protein: 8.2 g/dL — ABNORMAL HIGH (ref 6.5–8.1)

## 2019-04-23 LAB — FERRITIN: Ferritin: 28 ng/mL (ref 11–307)

## 2019-04-23 LAB — RETICULOCYTES
Immature Retic Fract: 19.5 % — ABNORMAL HIGH (ref 2.3–15.9)
RBC.: 4 MIL/uL (ref 3.87–5.11)
Retic Count, Absolute: 98.8 10*3/uL (ref 19.0–186.0)
Retic Ct Pct: 2.5 % (ref 0.4–3.1)

## 2019-04-23 NOTE — Progress Notes (Signed)
Hematology and Oncology Follow Up Visit  [name] [id number] 12-25-1965 53 y.o. 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 daily-start on 04/24/2019  Radiation therapy to hopefully start in 2 weeks.     Interim History:  [name] is back for follow-up.  React to have a new problem now.  She had a mammogram that was done back in September.  The mammogram surprisingly showed a area of suspicious calcification in the left breast.  She subsequently underwent a biopsy.  This was done on 02/21/2019.  The pathology report [id number]) showed ductal carcinoma in situ.  This was  intermediate grade. There was ER and PR receptors that were both positive.  She then underwent a formal lumpectomy.  This was done on 03/20/2019.  The pathology report [id number]) showed ductal carcinoma in situ that was high-grade with necrosis.  The margins were clear but close.  The tumor was 1 cm.  She had a final reexcision.  This was done on 04/06/2019.  The pathology report [id number]) showed a foci of ductal carcinoma in situ.  This was intermediate grade.  The DCIS was focally less than 1 mm from the margin.  I think she goes back to see the surgeon tomorrow.  She has already seen radiation oncology.  This is a whole new issue.  She comes in with her husband.  As always, we had good fellowship.  She has a very strong faith.  She still is premenopausal.  She still has her monthly cycles.  A confounding issue is the fact that she had a pulmonary embolism back in 2008.  She is on estrogen at that time for oral contraceptive.  We are going to have to use some type of systemic therapy given that this was high grade.  She has no history of breast cancer in her family.  I think there is a history of ovarian cancer in a maternal aunt.  I think  her father may have had malignancy.  She does mammograms routinely.  She does self exams.  She does not smoke.  Overall, her performance status is ECOG 0.   Medications:  Current Outpatient Medications:  .  ALPRAZolam (XANAX) 0.25 MG tablet, Take 0.25 mg by mouth 2 (two) times daily as needed for anxiety. , Disp: , Rfl:  .  amLODipine-valsartan (EXFORGE) 10-320 MG tablet, Take 1 tablet by mouth daily. , Disp: , Rfl:  .  amoxicillin (AMOXIL) 875 MG tablet, Take 875 mg by mouth 2 (two) times daily., Disp: , Rfl:  .  Ascorbic Acid (VITAMIN C) 1000 MG tablet, Take 1,000 mg by mouth daily. , Disp: , Rfl:  .  Cholecalciferol (VITAMIN D3) 125 MCG (5000 UT) CAPS, Take 5,000 Units by mouth daily. , Disp: , Rfl:  .  dexlansoprazole (DEXILANT) 60 MG capsule, Take 60 mg by mouth every morning. , Disp: , Rfl:  .  ferrous sulfate 325 (65 FE) MG tablet, Take 325 mg by mouth daily with breakfast. , Disp: , Rfl:  .  hydrochlorothiazide (HYDRODIURIL) 25 MG tablet, Take 25 mg by mouth daily. , Disp: , Rfl:  .  ibuprofen (ADVIL) 600 MG tablet, Take 600 mg by mouth every 6 (six) hours as needed., Disp: , Rfl:  .  ketoconazole (NIZORAL) 2 % cream, Apply 1 application topically daily as  needed for irritation. , Disp: , Rfl:  .  oxyCODONE (OXY IR/ROXICODONE) 5 MG immediate release tablet, Take 1 tablet (5 mg total) by mouth every 6 (six) hours as needed for moderate pain or severe pain., Disp: 25 tablet, Rfl: 0 .  Probiotic Product (PROBIOTIC-10) CAPS, Take 1 capsule by mouth daily. , Disp: , Rfl:  .  SPIRULINA PO, Take 1 capsule by mouth daily. , Disp: , Rfl:  .  tazarotene (TAZORAC) 0.05 % cream, Apply 1 application topically at bedtime as needed (acne). , Disp: , Rfl:   Allergies:  Allergies  Allergen Reactions  . Minocycline Hcl Hives and Itching  . Sulfa Antibiotics Swelling    Swell of the lips     Past Medical History, Surgical history, Social history, and Family History were reviewed and updated.   Review of Systems: Review of Systems  Constitutional: Negative.   HENT:  Negative.   Eyes: Negative.   Respiratory: Negative.   Cardiovascular: Negative.   Gastrointestinal: Negative.   Endocrine: Negative.   Genitourinary: Negative.    Musculoskeletal: Negative.   Skin: Negative.   Neurological: Negative.   Hematological: Negative.   Psychiatric/Behavioral: Negative.     Physical Exam:  weight is 196 lb 7 oz (89.1 kg). Her temporal temperature is 97.5 F (36.4 C) (abnormal). Her blood pressure is 132/63 and her pulse is 78. Her respiration is 18 and oxygen saturation is 100%.   Wt Readings from Last 3 Encounters:  04/23/19 196 lb 7 oz (89.1 kg)  04/06/19 190 lb (86.2 kg)  03/20/19 198 lb (89.8 kg)    Physical Exam Vitals signs reviewed.  Constitutional:      Comments: On her breast exam, right breast was without masses, edema or erythema.  There is no right axillary adenopathy.  Left breast is somewhat contracted from surgery.  She has the lumpectomy scar on the areola at about the 11 o'clock position.  There is no erythema.  There is no fullness.  There is no distinct mass in the left breast.  There is no left axillary adenopathy.  HENT:     Head: Normocephalic and atraumatic.  Eyes:     Pupils: Pupils are equal, round, and reactive to light.  Neck:     Musculoskeletal: Normal range of motion.  Cardiovascular:     Rate and Rhythm: Normal rate and regular rhythm.     Heart sounds: Normal heart sounds.  Pulmonary:     Effort: Pulmonary effort is normal.     Breath sounds: Normal breath sounds.  Abdominal:     General: Bowel sounds are normal.     Palpations: Abdomen is soft.  Musculoskeletal: Normal range of motion.        General: No tenderness or deformity.  Lymphadenopathy:     Cervical: No cervical adenopathy.  Skin:    General: Skin is warm and dry.     Findings: No erythema or rash.  Neurological:     Mental Status: She is alert and oriented to person,  place, and time.  Psychiatric:        Behavior: Behavior normal.        Thought Content: Thought content normal.        Judgment: Judgment normal.      Lab Results  Component Value Date   WBC 6.3 04/23/2019   HGB 11.6 (L) 04/23/2019   HCT 36.7 04/23/2019   MCV 90.4 04/23/2019   PLT 270 04/23/2019     Chemistry  Component Value Date/Time   NA 136 04/06/2019 0731   NA 142 04/12/2017 0858   K 3.7 04/06/2019 0731   K 3.8 04/12/2017 0858   CL 101 04/06/2019 0731   CL 104 04/12/2017 0858   CO2 25 04/06/2019 0731   CO2 26 04/12/2017 0858   BUN 9 04/06/2019 0731   BUN 9 04/12/2017 0858   CREATININE 0.84 04/06/2019 0731   CREATININE 0.68 10/30/2018 0757   CREATININE 0.8 04/12/2017 0858      Component Value Date/Time   CALCIUM 9.4 04/06/2019 0731   CALCIUM 9.2 04/12/2017 0858   ALKPHOS 65 10/30/2018 0757   ALKPHOS 74 04/12/2017 0858   AST 15 10/30/2018 0757   ALT 17 10/30/2018 0757   ALT 26 04/12/2017 0858   BILITOT 0.3 10/30/2018 0757         Impression and Plan: [name] is a 53 year old African-American female.  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  She has had her lumpectomy.  She had a reexcision.  I am unsure if a further reexcision is indicated.  She will see surgery about this.  She clearly will need systemic therapy.  I think [name] would be a reasonable choice for her.  I think there is a very, very low risk of thromboembolic disease with Fareston.  The risk definitely is lower with Fareston then with tamoxifen.  Once I know that she is menopausal, then we can switch her over to an aromatase inhibitor.  I spent about 45 minutes with she and her husband.  We went over the pathology reports.  I went over my recommendations.  She has a very good understanding of her situation.  She is very intelligent.  She and her husband both asked a lot of good questions.  Hopefully, she will be started with radiation therapy soon.  We will  plan to get her back in 6 weeks.  By then, she should be into her radiation therapy.   [name], MD 12/7/20208:16 AM

## 2019-04-23 NOTE — Telephone Encounter (Signed)
As noted below by Dr. Marin Olp, I informed the patient that her iron level has gone up. She verbalized understanding.

## 2019-04-23 NOTE — Telephone Encounter (Signed)
-----   Message from [name], MD sent at 04/23/2019  2:07 PM EST ----- Call - the iron level is great.  Laurey Arrow

## 2019-04-23 NOTE — Progress Notes (Signed)
Initial RN Navigator Patient Visit  Name: [name] Date of Referral : Already Established Patient Diagnosis: DCIS  Met with patient prior to their visit with MD. Hanley Seamen patient "Your Patient Navigator" handout which explains my role, areas in which I am able to help, and all the contact information for myself and the office. Also gave patient MD and Navigator business card.   New patient packet given to patient which includes: orientation to office and staff; campus directory; education on My Chart and Advance Directives; and patient centered education on breast cancer. Bag, book and binder from [employer] also given.  Patient completed visit with Dr. Marin Olp  Patient has already had surgery, been seen by radiation and has sim appointment.   Patient understands all follow up procedures and expectations. They have my number to reach out for any further clarification or additional needs.

## 2019-04-30 ENCOUNTER — Ambulatory Visit: Payer: BC Managed Care – PPO

## 2019-04-30 ENCOUNTER — Ambulatory Visit
Admission: RE | Admit: 2019-04-30 | Discharge: 2019-04-30 | Disposition: A | Discharge status: Home / Self Care | Payer: BC Managed Care – PPO | Source: Ambulatory Visit | Attending: Radiation Oncology | Admitting: Radiation Oncology

## 2019-05-03 ENCOUNTER — Encounter: Payer: Self-pay | Admitting: Radiation Oncology

## 2019-05-03 ENCOUNTER — Ambulatory Visit
Admission: RE | Admit: 2019-05-03 | Discharge: 2019-05-03 | Disposition: A | Discharge status: Home / Self Care | Payer: BC Managed Care – PPO | Source: Ambulatory Visit | Attending: Radiation Oncology | Admitting: Radiation Oncology

## 2019-05-03 ENCOUNTER — Other Ambulatory Visit: Payer: Self-pay

## 2019-05-03 VITALS — BP 134/80 | HR 84 | Temp 98.6°F | Resp 18 | Ht 65.0 in | Wt 194.0 lb

## 2019-05-03 DIAGNOSIS — C50212 Malignant neoplasm of upper-inner quadrant of left female breast: Secondary | ICD-10-CM

## 2019-05-03 DIAGNOSIS — Z79899 Other long term (current) drug therapy: Secondary | ICD-10-CM | POA: Insufficient documentation

## 2019-05-03 DIAGNOSIS — Z170 Estrogen receptor positive status [ER+]: Secondary | ICD-10-CM | POA: Diagnosis not present

## 2019-05-03 DIAGNOSIS — D0512 Intraductal carcinoma in situ of left breast: Secondary | ICD-10-CM | POA: Insufficient documentation

## 2019-05-03 NOTE — Patient Instructions (Signed)
Coronavirus (COVID-19) Are you at risk?  Are you at risk for the Coronavirus (COVID-19)?  To be considered HIGH RISK for Coronavirus (COVID-19), you have to meet the following criteria:  . Traveled to [place], [place], [place], [place] or [place]; or in the [place] to [place], [place], [place], or [place]; and have fever, cough, and shortness of breath within the last 2 weeks of travel OR . Been in close contact with a person diagnosed with COVID-19 within the last 2 weeks and have fever, cough, and shortness of breath . IF YOU DO NOT MEET THESE CRITERIA, YOU ARE CONSIDERED LOW RISK FOR COVID-19.  What to do if you are HIGH RISK for COVID-19?  . If you are having a medical emergency, call 911. . Seek medical care right away. Before you go to a doctor's office, urgent care or emergency department, call ahead and tell them about your recent travel, contact with someone diagnosed with COVID-19, and your symptoms. You should receive instructions from your physician's office regarding next steps of care.  . When you arrive at healthcare provider, tell the healthcare staff immediately you have returned from visiting [place], [place], [place], [place] or [place]; or traveled in the [place] to [place], [place], [place], or [place]; in the last two weeks or you have been in close contact with a person diagnosed with COVID-19 in the last 2 weeks.   . Tell the health care staff about your symptoms: fever, cough and shortness of breath. . After you have been seen by a medical provider, you will be either: o Tested for (COVID-19) and discharged home on quarantine except to seek medical care if symptoms worsen, and asked to  - Stay home and avoid contact with others until you get your results (4-5 days)  - Avoid travel on public transportation if possible (such as bus, train, or airplane) or o Sent to the Emergency Department by EMS for evaluation, COVID-19 testing, and possible  admission depending on your condition and test results.  What to do if you are LOW RISK for COVID-19?  Reduce your risk of any infection by using the same precautions used for avoiding the common cold or flu:  . Wash your hands often with soap and warm water for at least 20 seconds.  If soap and water are not readily available, use an alcohol-based hand sanitizer with at least 60% alcohol.  . If coughing or sneezing, cover your mouth and nose by coughing or sneezing into the elbow areas of your shirt or coat, into a tissue or into your sleeve (not your hands). . Avoid shaking hands with others and consider head nods or verbal greetings only. . Avoid touching your eyes, nose, or mouth with unwashed hands.  . Avoid close contact with people who are sick. . Avoid places or events with large numbers of people in one location, like concerts or sporting events. . Carefully consider travel plans you have or are making. . If you are planning any travel outside or inside the [place], visit the CDC's Travelers' Health webpage for the latest health notices. . If you have some symptoms but not all symptoms, continue to monitor at home and seek medical attention if your symptoms worsen. . If you are having a medical emergency, call 911.   ADDITIONAL HEALTHCARE OPTIONS FOR PATIENTS  [facility] / e-Visit: [email or web address]         [facility]: [phone]  [facility]: [phone]                   [facility]: [phone]   

## 2019-05-03 NOTE — Progress Notes (Signed)
Radiation Oncology         [phone]) [phone] ________________________________  Name: [name] MRN: [id number]  Date: 05/03/2019  DOB: 1965-09-27  Re-Evaluation Note  CC: Elwyn Reach, MD  Elwyn Reach, MD    ICD-10-CM   1. Malignant neoplasm of upper-inner quadrant of left breast in female, estrogen receptor positive ([place])  C50.212    Z17.0     Diagnosis:   Stage 0 Left Breast UIQ, DCIS, ER+ / PR+, Grade 3  Narrative:  The patient returns today to discuss radiation treatment options. She was seen in consultation on 03/08/2019.   Since consultation, she underwent genetic testing on 03/08/2019. Results showed no pathogenic mutations within the STAT breast cancer panel.  She opted to proceed with left lumpectomy on 03/20/2019. Pathology from the procedure revealed: DCIS, high grade, with necrosis, involving the anterior margin.   In light of the positive margin, she underwent re-excision on 04/06/2019. Final pathology showed close but clear margins.  On review of systems, the patient reports doing well overall, albeit anxious. She denies pain, lymphedema issues, and any other symptoms.    Allergies:  is allergic to minocycline hcl and sulfa antibiotics.  Meds: Current Outpatient Medications  Medication Sig Dispense Refill  . ALPRAZolam (XANAX) 0.25 MG tablet Take 0.25 mg by mouth 2 (two) times daily as needed for anxiety.     Marland Kitchen amLODipine-valsartan (EXFORGE) 10-320 MG tablet Take 1 tablet by mouth daily.     . Ascorbic Acid (VITAMIN C) 1000 MG tablet Take 1,000 mg by mouth daily.     . Cholecalciferol (VITAMIN D3) 125 MCG (5000 UT) CAPS Take 5,000 Units by mouth daily.     . ferrous sulfate 325 (65 FE) MG tablet Take 325 mg by mouth daily with breakfast.     . hydrochlorothiazide (HYDRODIURIL) 25 MG tablet Take 25 mg by mouth daily.     Marland Kitchen ibuprofen (ADVIL) 600 MG tablet Take 600 mg by mouth every 6 (six) hours as needed.    Marland Kitchen ketoconazole (NIZORAL) 2 % cream  Apply 1 application topically daily as needed for irritation.     . Probiotic Product (PROBIOTIC-10) CAPS Take 1 capsule by mouth daily.     Marland Kitchen SPIRULINA PO Take 1 capsule by mouth daily.     . tazarotene (TAZORAC) 0.05 % cream Apply 1 application topically at bedtime as needed (acne).     Marland Kitchen amoxicillin (AMOXIL) 875 MG tablet Take 875 mg by mouth 2 (two) times daily.    Marland Kitchen dexlansoprazole (DEXILANT) 60 MG capsule Take 60 mg by mouth every morning.     Marland Kitchen oxyCODONE (OXY IR/ROXICODONE) 5 MG immediate release tablet Take 1 tablet (5 mg total) by mouth every 6 (six) hours as needed for moderate pain or severe pain. (Patient not taking: Reported on 05/03/2019) 25 tablet 0   No current facility-administered medications for this encounter.    Physical Findings: The patient is in no acute distress. Patient is alert and oriented.  height is 5\' 5"  (1.651 m) and weight is 194 lb (88 kg). Her temporal temperature is 98.6 F (37 C). Her blood pressure is 134/80 and her pulse is 84. Her respiration is 18 and oxygen saturation is 99%.  Lungs are clear to auscultation bilaterally. Heart has regular rate and rhythm. No palpable cervical, supraclavicular, or axillary adenopathy. Abdomen soft, non-tender, normal bowel sounds. Right Breast: no palpable mass, nipple discharge or bleeding. Left Breast: The patient has a well-healing periareolar scar in the  upper aspect of the breast.  No signs of infection or drainage.  Induration along the scar consistent with surgical effect.  No nipple discharge or bleeding.  Lab Findings: Lab Results  Component Value Date   WBC 6.3 04/23/2019   HGB 11.6 (L) 04/23/2019   HCT 36.7 04/23/2019   MCV 90.4 04/23/2019   PLT 270 04/23/2019    Radiographic Findings: No results found.  Impression:  Stage 0 Left Breast UIQ, DCIS, ER+ / PR+, Grade 3  Patient will be a good candidate for breast conservation with radiation therapy directed at the left breast.  With reexcision,  the  anterior margin was cleared but continues to be close.  She will receive a boost to the lumpectomy cavity after completion of her whole breast radiation treatments.  Based on initial planning today the patient would appear to be a good candidate for hypofractionated accelerated radiation therapy over approximately 4 weeks.  She is interested in this short course of radiation therapy.  Plan:  Patient is scheduled for CT simulation on 05/14/2019 at 8 am.  Treatments to begin a week later.  Cardiac sparing techniques will be used if necessary.  -----------------------------------  Blair Promise, PhD, MD   This document serves as a record of services personally performed by Gery Pray, MD. It was created on his behalf by Wilburn Mylar, a trained medical scribe. The creation of this record is based on the scribe's personal observations and the provider's statements to them. This document has been checked and approved by the attending provider.

## 2019-05-03 NOTE — Progress Notes (Signed)
Location of Breast Cancer: DCIS of left breast   Histology per Pathology Report: 02/21/19: Diagnosis 1. Lymph node, needle/core biopsy, left axilla - REACTIVE LYMPH NODE. - NO METASTATIC CARCINOMA. 2. Breast, left, needle core biopsy, upper inner calcs - DUCTAL CARCINOMA IN SITU WITH CALCIFICATIONS. - SEE MICROSCOPIC DESCRIPTION  04/06/19: SURGICAL PATHOLOGY  CASE: [id number]  PATIENT: [name]  Surgical Pathology Report  Clinical History: None provided  FINAL MICROSCOPIC DIAGNOSIS:   A. BREAST, LEFT NEW ANTERIOR MARGIN, EXCISION:  - Foci of ductal carcinoma in situ, intermediate grade  - DCIS is focally less than 1 mm from the new margin.   Receptor Status: ER(100%), PR (100%)  Did patient present with symptoms (if so, please note symptoms) or was this found on screening mammography?:She was recently found to have calcifications in the upper inner left breast on screening mammography. She underwent a stereotactic biopsy of this area as well as reactive lymph nodes in her axilla.    Past/Anticipated interventions by surgeon, if any: 03/20/19: Procedure(s): LEFT BREAST PARTIAL MASTECTOMY WITH RADIOACTIVE SEED LOCALIZATION  Surgeon(s): Coralie Keens, MD   04/06/19: Procedure(s): RE-EXCISION OF LEFT BREAST DCIS MARGINS  Surgeon(s): Coralie Keens, MD    Past/Anticipated interventions by medical oncology, if any: Chemotherapy Per Dr. Marin Olp 04/23/19:  Impression and Plan: [name] is a 53 year old African-American female.  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  She has had her lumpectomy.  She had a reexcision.  I am unsure if a further reexcision is indicated.  She will see surgery about this.  She clearly will need systemic therapy.  I think [name] would be a reasonable choice for her.  I think there is a very, very low risk of thromboembolic disease with Fareston.  The risk definitely is lower with Fareston then with  tamoxifen.  Once I know that she is menopausal, then we can switch her over to an aromatase inhibitor.  I spent about 45 minutes with she and her husband.  We went over the pathology reports.  I went over my recommendations.  She has a very good understanding of her situation.  She is very intelligent.  She and her husband both asked a lot of good questions.  Hopefully, she will be started with radiation therapy soon.  We will plan to get her back in 6 weeks.  By then, she should be into her radiation therapy.   Lymphedema issues, if any:  Pt denies "any physical noticeable swelling but they said my lymph nodes are a Hesketh plump"  Pain issues, if any:  Pt denies c/o pain.  SAFETY ISSUES:  Prior radiation? No  Pacemaker/ICD? No  Possible current pregnancy?No  Is the patient on methotrexate? No  Current Complaints / other details:  Pt presents today for reconsult with Dr. Sondra Come for Radiation Oncology. Pt is anxious.   BP 134/80 (BP Location: Right Arm, Patient Position: Sitting)   Pulse 84   Temp 98.6 F (37 C) (Temporal)   Resp 18   Ht 5\' 5"  (1.651 m)   Wt 194 lb (88 kg)   SpO2 99%   BMI 32.28 kg/m   Wt Readings from Last 3 Encounters:  05/03/19 194 lb (88 kg)  04/23/19 196 lb 7 oz (89.1 kg)  04/06/19 190 lb (86.2 kg)       Loma Sousa, RN 05/03/2019,8:04 AM

## 2019-05-14 ENCOUNTER — Other Ambulatory Visit: Payer: Self-pay

## 2019-05-14 ENCOUNTER — Ambulatory Visit
Admission: RE | Admit: 2019-05-14 | Discharge: 2019-05-14 | Disposition: A | Discharge status: Home / Self Care | Payer: BC Managed Care – PPO | Source: Ambulatory Visit | Attending: Radiation Oncology | Admitting: Radiation Oncology

## 2019-05-14 DIAGNOSIS — C50212 Malignant neoplasm of upper-inner quadrant of left female breast: Secondary | ICD-10-CM | POA: Diagnosis not present

## 2019-05-14 DIAGNOSIS — Z170 Estrogen receptor positive status [ER+]: Secondary | ICD-10-CM | POA: Diagnosis not present

## 2019-05-14 NOTE — Progress Notes (Signed)
Radiation Oncology         [phone] ________________________________  Name: [name] MRN: [id number]  Date: 05/14/2019  DOB: 1965-10-12  SIMULATION AND TREATMENT PLANNING NOTE    ICD-10-CM   1. Malignant neoplasm of upper-inner quadrant of left breast in female, estrogen receptor positive ([place])  C50.212    Z17.0     DIAGNOSIS: Stage 0 Left Breast UIQ, DCIS, ER+ / PR+, Grade 3  NARRATIVE:  The patient was brought to the [facility].  Identity was confirmed.  All relevant records and images related to the planned course of therapy were reviewed.  The patient freely provided informed written consent to proceed with treatment after reviewing the details related to the planned course of therapy. The consent form was witnessed and verified by the simulation staff.  Then, the patient was set-up in a stable reproducible  supine position for radiation therapy.  CT images were obtained.  Surface markings were placed.  The CT images were loaded into the planning software.  Then the target and avoidance structures were contoured.  Treatment planning then occurred.  The radiation prescription was entered and confirmed.  Then, I designed and supervised the construction of a total of 5 medically necessary complex treatment devices.  I have requested : 3D Simulation  I have requested a DVH of the following structures: Lumpectomy cavity, heart, lungs.  I have ordered:CBC  PLAN:  The patient will receive 40.05 Gy in 15 fractions followed by a boost to the lumpectomy cavity of 12 Gy in 6 fractions.   Optical Surface Tracking Plan:  Since intensity modulated radiotherapy (IMRT) and 3D conformal radiation treatment methods are predicated on accurate and precise positioning for treatment, intrafraction motion monitoring is medically necessary to ensure accurate and safe treatment delivery.  The ability to quantify intrafraction motion without excessive ionizing radiation dose can  only be performed with optical surface tracking. Accordingly, surface imaging offers the opportunity to obtain 3D measurements of patient position throughout IMRT and 3D treatments without excessive radiation exposure.  I am ordering optical surface tracking for this patient's upcoming course of radiotherapy. ________________________________  Special treatment procedure was performed today due to the extra time and effort required by myself to plan and prepare this patient for deep inspiration breath hold technique.  I have determined cardiac sparing to be of benefit to this patient to prevent long term cardiac damage due to radiation of the heart.  Bellows were placed on the patient's abdomen. To facilitate cardiac sparing, the patient was coached by the radiation therapists on breath hold techniques and breathing practice was performed. Practice waveforms were obtained. The patient was then scanned while maintaining breath hold in the treatment position.  This image was then transferred over to the imaging specialist. The imaging specialist then created a fusion of the free breathing and breath hold scans using the chest wall as the stable structure. I personally reviewed the fusion in axial, coronal and sagittal image planes.  Excellent cardiac sparing was obtained.  I felt the patient is an appropriate candidate for breath hold and the patient will be treated as such.  The image fusion was then reviewed with the patient to reinforce the necessity of reproducible breath hold.    -----------------------------------  Blair Promise, PhD, MD  This document serves as a record of services personally performed by Gery Pray, MD. It was created on his behalf by Clerance Lav, a trained medical scribe. The creation of this record is  based on the scribe's personal observations and the provider's statements to them. This document has been checked and approved by the attending provider.

## 2019-05-21 DIAGNOSIS — C50212 Malignant neoplasm of upper-inner quadrant of left female breast: Secondary | ICD-10-CM | POA: Insufficient documentation

## 2019-05-21 DIAGNOSIS — Z170 Estrogen receptor positive status [ER+]: Secondary | ICD-10-CM | POA: Insufficient documentation

## 2019-05-22 ENCOUNTER — Ambulatory Visit
Admission: RE | Admit: 2019-05-22 | Discharge: 2019-05-22 | Disposition: A | Discharge status: Home / Self Care | Payer: BC Managed Care – PPO | Source: Ambulatory Visit | Attending: Radiation Oncology | Admitting: Radiation Oncology

## 2019-05-22 DIAGNOSIS — Z170 Estrogen receptor positive status [ER+]: Secondary | ICD-10-CM

## 2019-05-22 DIAGNOSIS — C50212 Malignant neoplasm of upper-inner quadrant of left female breast: Secondary | ICD-10-CM

## 2019-05-22 NOTE — Progress Notes (Signed)
  Radiation Oncology         [phone]) [phone] ________________________________  Name: [name] MRN: [id number]  Date: 05/22/2019  DOB: March 24, 1966  Simulation Verification Note    ICD-10-CM   1. Malignant neoplasm of upper-inner quadrant of left breast in female, estrogen receptor positive ([place])  C50.212    Z17.0     Status: outpatient  NARRATIVE: The patient was brought to the treatment unit and placed in the planned treatment position. The clinical setup was verified. Then port films were obtained and uploaded to the radiation oncology medical record software.  The treatment beams were carefully compared against the planned radiation fields. The position location and shape of the radiation fields was reviewed. They targeted volume of tissue appears to be appropriately covered by the radiation beams. Organs at risk appear to be excluded as planned.  Based on my personal review, I approved the simulation verification. The patient's treatment will proceed as planned.  -----------------------------------  Blair Promise, PhD, MD

## 2019-05-23 ENCOUNTER — Other Ambulatory Visit: Payer: Self-pay

## 2019-05-23 ENCOUNTER — Encounter: Payer: Self-pay | Admitting: *Deleted

## 2019-05-23 ENCOUNTER — Ambulatory Visit
Admission: RE | Admit: 2019-05-23 | Discharge: 2019-05-23 | Disposition: A | Discharge status: Home / Self Care | Payer: BC Managed Care – PPO | Source: Ambulatory Visit | Attending: Radiation Oncology | Admitting: Radiation Oncology

## 2019-05-23 DIAGNOSIS — C50212 Malignant neoplasm of upper-inner quadrant of left female breast: Secondary | ICD-10-CM | POA: Diagnosis not present

## 2019-05-24 ENCOUNTER — Other Ambulatory Visit: Payer: Self-pay

## 2019-05-24 ENCOUNTER — Ambulatory Visit
Admission: RE | Admit: 2019-05-24 | Discharge: 2019-05-24 | Disposition: A | Discharge status: Home / Self Care | Payer: BC Managed Care – PPO | Source: Ambulatory Visit | Attending: Radiation Oncology | Admitting: Radiation Oncology

## 2019-05-24 DIAGNOSIS — C50212 Malignant neoplasm of upper-inner quadrant of left female breast: Secondary | ICD-10-CM | POA: Diagnosis not present

## 2019-05-25 ENCOUNTER — Other Ambulatory Visit: Payer: Self-pay

## 2019-05-25 ENCOUNTER — Ambulatory Visit
Admission: RE | Admit: 2019-05-25 | Discharge: 2019-05-25 | Disposition: A | Discharge status: Home / Self Care | Payer: BC Managed Care – PPO | Source: Ambulatory Visit | Attending: Radiation Oncology | Admitting: Radiation Oncology

## 2019-05-25 DIAGNOSIS — C50212 Malignant neoplasm of upper-inner quadrant of left female breast: Secondary | ICD-10-CM | POA: Diagnosis not present

## 2019-05-28 ENCOUNTER — Ambulatory Visit
Admission: RE | Admit: 2019-05-28 | Discharge: 2019-05-28 | Disposition: A | Discharge status: Home / Self Care | Payer: BC Managed Care – PPO | Source: Ambulatory Visit | Attending: Radiation Oncology | Admitting: Radiation Oncology

## 2019-05-28 DIAGNOSIS — C50212 Malignant neoplasm of upper-inner quadrant of left female breast: Secondary | ICD-10-CM | POA: Diagnosis not present

## 2019-05-29 ENCOUNTER — Other Ambulatory Visit: Payer: Self-pay

## 2019-05-29 ENCOUNTER — Ambulatory Visit
Admission: RE | Admit: 2019-05-29 | Discharge: 2019-05-29 | Disposition: A | Discharge status: Home / Self Care | Payer: BC Managed Care – PPO | Source: Ambulatory Visit | Attending: Radiation Oncology | Admitting: Radiation Oncology

## 2019-05-29 DIAGNOSIS — C50212 Malignant neoplasm of upper-inner quadrant of left female breast: Secondary | ICD-10-CM | POA: Diagnosis not present

## 2019-05-30 ENCOUNTER — Other Ambulatory Visit: Payer: Self-pay

## 2019-05-30 ENCOUNTER — Ambulatory Visit
Admission: RE | Admit: 2019-05-30 | Discharge: 2019-05-30 | Disposition: A | Discharge status: Home / Self Care | Payer: BC Managed Care – PPO | Source: Ambulatory Visit | Attending: Radiation Oncology | Admitting: Radiation Oncology

## 2019-05-30 DIAGNOSIS — C50212 Malignant neoplasm of upper-inner quadrant of left female breast: Secondary | ICD-10-CM | POA: Diagnosis not present

## 2019-05-31 ENCOUNTER — Ambulatory Visit: Payer: BC Managed Care – PPO

## 2019-06-01 ENCOUNTER — Other Ambulatory Visit: Payer: Self-pay

## 2019-06-01 ENCOUNTER — Ambulatory Visit
Admission: RE | Admit: 2019-06-01 | Discharge: 2019-06-01 | Disposition: A | Discharge status: Home / Self Care | Payer: BC Managed Care – PPO | Source: Ambulatory Visit | Attending: Radiation Oncology | Admitting: Radiation Oncology

## 2019-06-01 DIAGNOSIS — C50212 Malignant neoplasm of upper-inner quadrant of left female breast: Secondary | ICD-10-CM | POA: Diagnosis not present

## 2019-06-04 ENCOUNTER — Telehealth: Payer: Self-pay | Admitting: Hematology & Oncology

## 2019-06-04 ENCOUNTER — Encounter: Payer: Self-pay | Admitting: Hematology & Oncology

## 2019-06-04 ENCOUNTER — Other Ambulatory Visit: Payer: Self-pay

## 2019-06-04 ENCOUNTER — Inpatient Hospital Stay (HOSPITAL_BASED_OUTPATIENT_CLINIC_OR_DEPARTMENT_OTHER): Payer: BC Managed Care – PPO | Admitting: Hematology & Oncology

## 2019-06-04 ENCOUNTER — Inpatient Hospital Stay: Payer: BC Managed Care – PPO | Attending: Genetic Counselor

## 2019-06-04 ENCOUNTER — Ambulatory Visit
Admission: RE | Admit: 2019-06-04 | Discharge: 2019-06-04 | Disposition: A | Discharge status: Home / Self Care | Payer: BC Managed Care – PPO | Source: Ambulatory Visit | Attending: Radiation Oncology | Admitting: Radiation Oncology

## 2019-06-04 ENCOUNTER — Encounter: Payer: Self-pay | Admitting: *Deleted

## 2019-06-04 VITALS — BP 125/67 | HR 67 | Temp 97.1°F | Resp 18 | Ht 65.0 in | Wt 196.8 lb

## 2019-06-04 DIAGNOSIS — C50212 Malignant neoplasm of upper-inner quadrant of left female breast: Secondary | ICD-10-CM | POA: Diagnosis not present

## 2019-06-04 DIAGNOSIS — D0512 Intraductal carcinoma in situ of left breast: Secondary | ICD-10-CM | POA: Insufficient documentation

## 2019-06-04 DIAGNOSIS — Z79899 Other long term (current) drug therapy: Secondary | ICD-10-CM | POA: Insufficient documentation

## 2019-06-04 DIAGNOSIS — Z170 Estrogen receptor positive status [ER+]: Secondary | ICD-10-CM

## 2019-06-04 DIAGNOSIS — D500 Iron deficiency anemia secondary to blood loss (chronic): Secondary | ICD-10-CM | POA: Diagnosis not present

## 2019-06-04 DIAGNOSIS — Z791 Long term (current) use of non-steroidal anti-inflammatories (NSAID): Secondary | ICD-10-CM | POA: Diagnosis not present

## 2019-06-04 DIAGNOSIS — N921 Excessive and frequent menstruation with irregular cycle: Secondary | ICD-10-CM | POA: Diagnosis not present

## 2019-06-04 LAB — FERRITIN: Ferritin: 29 ng/mL (ref 11–307)

## 2019-06-04 LAB — CBC WITH DIFFERENTIAL (CANCER CENTER ONLY)
Abs Immature Granulocytes: 0.09 10*3/uL — ABNORMAL HIGH (ref 0.00–0.07)
Basophils Absolute: 0 10*3/uL (ref 0.0–0.1)
Basophils Relative: 1 %
Eosinophils Absolute: 0.2 10*3/uL (ref 0.0–0.5)
Eosinophils Relative: 4 %
HCT: 36.5 % (ref 36.0–46.0)
Hemoglobin: 11.6 g/dL — ABNORMAL LOW (ref 12.0–15.0)
Immature Granulocytes: 2 %
Lymphocytes Relative: 23 %
Lymphs Abs: 1 10*3/uL (ref 0.7–4.0)
MCH: 28.6 pg (ref 26.0–34.0)
MCHC: 31.8 g/dL (ref 30.0–36.0)
MCV: 90.1 fL (ref 80.0–100.0)
Monocytes Absolute: 0.5 10*3/uL (ref 0.1–1.0)
Monocytes Relative: 11 %
Neutro Abs: 2.6 10*3/uL (ref 1.7–7.7)
Neutrophils Relative %: 59 %
Platelet Count: 253 10*3/uL (ref 150–400)
RBC: 4.05 MIL/uL (ref 3.87–5.11)
RDW: 12.4 % (ref 11.5–15.5)
WBC Count: 4.3 10*3/uL (ref 4.0–10.5)
nRBC: 0 % (ref 0.0–0.2)

## 2019-06-04 LAB — CMP (CANCER CENTER ONLY)
ALT: 13 U/L (ref 0–44)
AST: 15 U/L (ref 15–41)
Albumin: 3.8 g/dL (ref 3.5–5.0)
Alkaline Phosphatase: 66 U/L (ref 38–126)
Anion gap: 6 (ref 5–15)
BUN: 16 mg/dL (ref 6–20)
CO2: 27 mmol/L (ref 22–32)
Calcium: 9.5 mg/dL (ref 8.9–10.3)
Chloride: 104 mmol/L (ref 98–111)
Creatinine: 0.69 mg/dL (ref 0.44–1.00)
GFR, Est AFR Am: >60 mL/min (ref >60)
GFR, Estimated: >60 mL/min (ref >60)
Glucose, Bld: 107 mg/dL — ABNORMAL HIGH (ref 70–99)
Potassium: 3.5 mmol/L (ref 3.5–5.1)
Sodium: 137 mmol/L (ref 135–145)
Total Bilirubin: 0.4 mg/dL (ref 0.3–1.2)
Total Protein: 7.5 g/dL (ref 6.5–8.1)

## 2019-06-04 LAB — IRON AND TIBC
Iron: 54 ug/dL (ref 41–142)
Saturation Ratios: 22 % (ref 21–57)
TIBC: 239 ug/dL (ref 236–444)
UIBC: 185 ug/dL (ref 120–384)

## 2019-06-04 NOTE — Progress Notes (Signed)
Hematology and Oncology Follow Up Visit  [name] [id number] [birth year] 54 y.o. 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 daily-start on 06/24/2018  Radiation therapy to hopefully start in 2 weeks.     Interim History:  [name] is back for follow-up.  She is doing quite well.  She is about halfway through her radiation.  Nothing she has about 13 treatments left.  She really had no problems with skin breakdown around the left breast.  She has had no fatigue.  She has had no fever.  There has been no bleeding or bruising.  She has had no cough or shortness of breath.  There is been no arm or leg swelling.  Overall, her performance status is ECOG 1.    Medications:  Current Outpatient Medications:  .  ALPRAZolam (XANAX) 0.25 MG tablet, Take 0.25 mg by mouth 2 (two) times daily as needed for anxiety. , Disp: , Rfl:  .  amLODipine-valsartan (EXFORGE) 10-320 MG tablet, Take 1 tablet by mouth daily. , Disp: , Rfl:  .  Ascorbic Acid (VITAMIN C) 1000 MG tablet, Take 1,000 mg by mouth daily. , Disp: , Rfl:  .  Cholecalciferol (VITAMIN D3) 125 MCG (5000 UT) CAPS, Take 5,000 Units by mouth daily. , Disp: , Rfl:  .  dexlansoprazole (DEXILANT) 60 MG capsule, Take 60 mg by mouth every morning. , Disp: , Rfl:  .  ferrous sulfate 325 (65 FE) MG tablet, Take 325 mg by mouth daily with breakfast. , Disp: , Rfl:  .  hydrochlorothiazide (HYDRODIURIL) 25 MG tablet, Take 25 mg by mouth daily. , Disp: , Rfl:  .  ibuprofen (ADVIL) 600 MG tablet, Take 600 mg by mouth every 6 (six) hours as needed., Disp: , Rfl:  .  ketoconazole (NIZORAL) 2 % cream, Apply 1 application topically daily as needed for irritation. , Disp: , Rfl:  .  oxyCODONE (OXY IR/ROXICODONE) 5 MG immediate release tablet, Take 1 tablet (5 mg total) by mouth every 6  (six) hours as needed for moderate pain or severe pain., Disp: 25 tablet, Rfl: 0 .  Probiotic Product (PROBIOTIC-10) CAPS, Take 1 capsule by mouth daily. , Disp: , Rfl:  .  SPIRULINA PO, Take 1 capsule by mouth daily. , Disp: , Rfl:  .  tazarotene (TAZORAC) 0.05 % cream, Apply 1 application topically at bedtime as needed (acne). , Disp: , Rfl:   Allergies:  Allergies  Allergen Reactions  . Minocycline Hcl Hives and Itching  . Sulfa Antibiotics Swelling    Swell of the lips     Past Medical History, Surgical history, Social history, and Family History were reviewed and updated.  Review of Systems: Review of Systems  Constitutional: Negative.   HENT:  Negative.   Eyes: Negative.   Respiratory: Negative.   Cardiovascular: Negative.   Gastrointestinal: Negative.   Endocrine: Negative.   Genitourinary: Negative.    Musculoskeletal: Negative.   Skin: Negative.   Neurological: Negative.   Hematological: Negative.   Psychiatric/Behavioral: Negative.     Physical Exam:  height is 5\' 5"  (1.651 m) and weight is 196 lb 12.8 oz (89.3 kg). Her temporal temperature is 97.1 F (36.2 C) (abnormal). Her blood pressure is 125/67 and her pulse is 67. Her respiration is 18 and oxygen saturation is 100%.  Wt Readings from Last 3 Encounters:  06/04/19 196 lb 12.8 oz (89.3 kg)  05/03/19 194 lb (88 kg)  04/23/19 196 lb 7 oz (89.1 kg)    Physical Exam Vitals reviewed.  Constitutional:      Comments: On her breast exam, right breast was without masses, edema or erythema.  There is no right axillary adenopathy.  Left breast is somewhat contracted from surgery.  She has the lumpectomy scar on the areola at about the 11 o'clock position.  There is no erythema.  There is no fullness.  There is no distinct mass in the left breast.  There is no left axillary adenopathy.  HENT:     Head: Normocephalic and atraumatic.  Eyes:     Pupils: Pupils are equal, round, and reactive to light.  Cardiovascular:      Rate and Rhythm: Normal rate and regular rhythm.     Heart sounds: Normal heart sounds.  Pulmonary:     Effort: Pulmonary effort is normal.     Breath sounds: Normal breath sounds.  Abdominal:     General: Bowel sounds are normal.     Palpations: Abdomen is soft.  Musculoskeletal:        General: No tenderness or deformity. Normal range of motion.     Cervical back: Normal range of motion.  Lymphadenopathy:     Cervical: No cervical adenopathy.  Skin:    General: Skin is warm and dry.     Findings: No erythema or rash.  Neurological:     Mental Status: She is alert and oriented to person, place, and time.  Psychiatric:        Behavior: Behavior normal.        Thought Content: Thought content normal.        Judgment: Judgment normal.      Lab Results  Component Value Date   WBC 4.3 06/04/2019   HGB 11.6 (L) 06/04/2019   HCT 36.5 06/04/2019   MCV 90.1 06/04/2019   PLT 253 06/04/2019     Chemistry      Component Value Date/Time   NA 137 06/04/2019 0801   NA 142 04/12/2017 0858   K 3.5 06/04/2019 0801   K 3.8 04/12/2017 0858   CL 104 06/04/2019 0801   CL 104 04/12/2017 0858   CO2 27 06/04/2019 0801   CO2 26 04/12/2017 0858   BUN 16 06/04/2019 0801   BUN 9 04/12/2017 0858   CREATININE 0.69 06/04/2019 0801   CREATININE 0.8 04/12/2017 0858      Component Value Date/Time   CALCIUM 9.5 06/04/2019 0801   CALCIUM 9.2 04/12/2017 0858   ALKPHOS 66 06/04/2019 0801   ALKPHOS 74 04/12/2017 0858   AST 15 06/04/2019 0801   ALT 13 06/04/2019 0801   ALT 26 04/12/2017 0858   BILITOT 0.4 06/04/2019 0801      Impression and Plan: [name] is a 54 year old African-American female.  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  She has had her lumpectomy.  She had a reexcision.  She will continue her radiation therapy.  When she finishes her radiation therapy, then we will see about getting her on systemic therapy.  Since she is premenopausal, I probably  will choose Fareston.  This has a much lower risk of thromboembolic disease then tamoxifen dose.  If we find that she is postmenopausal, then we will get her on an aromatase inhibitor.  I will see her back in about a  month.  By then, she will be done with her radiation therapy.  [name], MD 1/18/20218:39 AM

## 2019-06-04 NOTE — Telephone Encounter (Signed)
Appointments scheduled .  Patient requested 2/22 instead of 2/15 (per 1/18 los)patient declined calendar she has my chart access per 1/18 los

## 2019-06-05 ENCOUNTER — Ambulatory Visit
Admission: RE | Admit: 2019-06-05 | Discharge: 2019-06-05 | Disposition: A | Discharge status: Home / Self Care | Payer: BC Managed Care – PPO | Source: Ambulatory Visit | Attending: Radiation Oncology | Admitting: Radiation Oncology

## 2019-06-05 ENCOUNTER — Other Ambulatory Visit: Payer: Self-pay

## 2019-06-05 DIAGNOSIS — C50212 Malignant neoplasm of upper-inner quadrant of left female breast: Secondary | ICD-10-CM | POA: Diagnosis not present

## 2019-06-05 LAB — FOLLICLE STIMULATING HORMONE: FSH: 27.7 m[IU]/mL

## 2019-06-05 LAB — LUTEINIZING HORMONE: LH: 54.1 m[IU]/mL

## 2019-06-06 ENCOUNTER — Other Ambulatory Visit: Payer: Self-pay

## 2019-06-06 ENCOUNTER — Ambulatory Visit
Admission: RE | Admit: 2019-06-06 | Discharge: 2019-06-06 | Disposition: A | Discharge status: Home / Self Care | Payer: BC Managed Care – PPO | Source: Ambulatory Visit | Attending: Radiation Oncology | Admitting: Radiation Oncology

## 2019-06-06 DIAGNOSIS — C50212 Malignant neoplasm of upper-inner quadrant of left female breast: Secondary | ICD-10-CM | POA: Diagnosis not present

## 2019-06-07 ENCOUNTER — Ambulatory Visit
Admission: RE | Admit: 2019-06-07 | Discharge: 2019-06-07 | Disposition: A | Discharge status: Home / Self Care | Payer: BC Managed Care – PPO | Source: Ambulatory Visit | Attending: Radiation Oncology | Admitting: Radiation Oncology

## 2019-06-07 ENCOUNTER — Other Ambulatory Visit: Payer: Self-pay

## 2019-06-07 DIAGNOSIS — C50212 Malignant neoplasm of upper-inner quadrant of left female breast: Secondary | ICD-10-CM | POA: Diagnosis not present

## 2019-06-08 ENCOUNTER — Ambulatory Visit
Admission: RE | Admit: 2019-06-08 | Discharge: 2019-06-08 | Disposition: A | Discharge status: Home / Self Care | Payer: BC Managed Care – PPO | Source: Ambulatory Visit | Attending: Radiation Oncology | Admitting: Radiation Oncology

## 2019-06-08 ENCOUNTER — Other Ambulatory Visit: Payer: Self-pay

## 2019-06-08 DIAGNOSIS — C50212 Malignant neoplasm of upper-inner quadrant of left female breast: Secondary | ICD-10-CM | POA: Diagnosis not present

## 2019-06-08 LAB — ESTRADIOL, ULTRA SENS: Estradiol, Sensitive: 80.1 pg/mL

## 2019-06-11 ENCOUNTER — Ambulatory Visit
Admission: RE | Admit: 2019-06-11 | Discharge: 2019-06-11 | Disposition: A | Discharge status: Home / Self Care | Payer: BC Managed Care – PPO | Source: Ambulatory Visit | Attending: Radiation Oncology | Admitting: Radiation Oncology

## 2019-06-11 ENCOUNTER — Other Ambulatory Visit: Payer: Self-pay

## 2019-06-11 DIAGNOSIS — C50212 Malignant neoplasm of upper-inner quadrant of left female breast: Secondary | ICD-10-CM | POA: Diagnosis not present

## 2019-06-11 NOTE — Progress Notes (Signed)
  Radiation Oncology         [phone]) [phone] ________________________________  Name: [name] MRN: [id number]  Date: 06/12/2019  DOB: August 16, 1965  Simulation verification- electron   The patient was brought to the treatment machine and placed in the plan treatment position.  Clinical set up was verified to ensure that the target region is appropriately covered for the patient's upcoming electron boost treatment.  The targeted volume of tissue is appropriately covered by the radiation field.  Based on my personal review, I approve the simulation verification.  The patient's treatment will proceed as planned.  -----------------------------------  Blair Promise, PhD, MD  This document serves as a record of services personally performed by Gery Pray, MD. It was created on his behalf by Clerance Lav, a trained medical scribe. The creation of this record is based on the scribe's personal observations and the provider's statements to them. This document has been checked and approved by the attending provider.

## 2019-06-12 ENCOUNTER — Ambulatory Visit
Admission: RE | Admit: 2019-06-12 | Discharge: 2019-06-12 | Disposition: A | Discharge status: Home / Self Care | Payer: BC Managed Care – PPO | Source: Ambulatory Visit | Attending: Radiation Oncology | Admitting: Radiation Oncology

## 2019-06-12 ENCOUNTER — Other Ambulatory Visit: Payer: Self-pay

## 2019-06-12 ENCOUNTER — Ambulatory Visit: Payer: BC Managed Care – PPO

## 2019-06-12 DIAGNOSIS — Z170 Estrogen receptor positive status [ER+]: Secondary | ICD-10-CM

## 2019-06-12 DIAGNOSIS — C50212 Malignant neoplasm of upper-inner quadrant of left female breast: Secondary | ICD-10-CM | POA: Diagnosis not present

## 2019-06-13 ENCOUNTER — Ambulatory Visit
Admission: RE | Admit: 2019-06-13 | Discharge: 2019-06-13 | Disposition: A | Discharge status: Home / Self Care | Payer: BC Managed Care – PPO | Source: Ambulatory Visit | Attending: Radiation Oncology | Admitting: Radiation Oncology

## 2019-06-13 ENCOUNTER — Ambulatory Visit: Payer: BC Managed Care – PPO

## 2019-06-13 ENCOUNTER — Other Ambulatory Visit: Payer: Self-pay

## 2019-06-13 DIAGNOSIS — C50212 Malignant neoplasm of upper-inner quadrant of left female breast: Secondary | ICD-10-CM | POA: Diagnosis not present

## 2019-06-14 ENCOUNTER — Ambulatory Visit
Admission: RE | Admit: 2019-06-14 | Discharge: 2019-06-14 | Disposition: A | Discharge status: Home / Self Care | Payer: BC Managed Care – PPO | Source: Ambulatory Visit | Attending: Radiation Oncology | Admitting: Radiation Oncology

## 2019-06-14 ENCOUNTER — Other Ambulatory Visit: Payer: Self-pay

## 2019-06-14 DIAGNOSIS — C50212 Malignant neoplasm of upper-inner quadrant of left female breast: Secondary | ICD-10-CM | POA: Diagnosis not present

## 2019-06-15 ENCOUNTER — Ambulatory Visit
Admission: RE | Admit: 2019-06-15 | Discharge: 2019-06-15 | Disposition: A | Discharge status: Home / Self Care | Payer: BC Managed Care – PPO | Source: Ambulatory Visit | Attending: Radiation Oncology | Admitting: Radiation Oncology

## 2019-06-15 ENCOUNTER — Other Ambulatory Visit: Payer: Self-pay

## 2019-06-15 DIAGNOSIS — C50212 Malignant neoplasm of upper-inner quadrant of left female breast: Secondary | ICD-10-CM | POA: Diagnosis not present

## 2019-06-18 ENCOUNTER — Other Ambulatory Visit: Payer: Self-pay

## 2019-06-18 ENCOUNTER — Inpatient Hospital Stay: Admission: RE | Admit: 2019-06-18 | Payer: Self-pay | Source: Ambulatory Visit | Admitting: Radiation Oncology

## 2019-06-18 ENCOUNTER — Ambulatory Visit
Admission: RE | Admit: 2019-06-18 | Discharge: 2019-06-18 | Disposition: A | Discharge status: Home / Self Care | Payer: BC Managed Care – PPO | Source: Ambulatory Visit | Attending: Radiation Oncology | Admitting: Radiation Oncology

## 2019-06-18 DIAGNOSIS — C50212 Malignant neoplasm of upper-inner quadrant of left female breast: Secondary | ICD-10-CM | POA: Diagnosis present

## 2019-06-18 DIAGNOSIS — Z170 Estrogen receptor positive status [ER+]: Secondary | ICD-10-CM | POA: Diagnosis not present

## 2019-06-19 ENCOUNTER — Ambulatory Visit: Payer: BC Managed Care – PPO

## 2019-06-19 ENCOUNTER — Ambulatory Visit
Admission: RE | Admit: 2019-06-19 | Discharge: 2019-06-19 | Disposition: A | Discharge status: Home / Self Care | Payer: BC Managed Care – PPO | Source: Ambulatory Visit | Attending: Radiation Oncology | Admitting: Radiation Oncology

## 2019-06-19 ENCOUNTER — Other Ambulatory Visit: Payer: Self-pay

## 2019-06-19 DIAGNOSIS — C50212 Malignant neoplasm of upper-inner quadrant of left female breast: Secondary | ICD-10-CM | POA: Diagnosis not present

## 2019-06-20 ENCOUNTER — Ambulatory Visit
Admission: RE | Admit: 2019-06-20 | Discharge: 2019-06-20 | Disposition: A | Discharge status: Home / Self Care | Payer: BC Managed Care – PPO | Source: Ambulatory Visit | Attending: Radiation Oncology | Admitting: Radiation Oncology

## 2019-06-20 ENCOUNTER — Other Ambulatory Visit: Payer: Self-pay

## 2019-06-20 ENCOUNTER — Encounter: Payer: Self-pay | Admitting: Radiation Oncology

## 2019-06-20 DIAGNOSIS — C50212 Malignant neoplasm of upper-inner quadrant of left female breast: Secondary | ICD-10-CM | POA: Diagnosis not present

## 2019-07-04 NOTE — Progress Notes (Incomplete)
  Patient Name: [name] MRN: [id number] DOB: January 16, 1966 Referring Physician: Gala Romney (Profile Not Attached) Date of Service: 06/20/2019 [facility], [place]                                                        End Of Treatment Note  Diagnoses: D05.12-Intraductal carcinoma in situ of left breast  Cancer Staging: Stage 0 Left Breast UIQ, DCIS, ER+ / PR+, Grade 3  Intent: Curative  Radiation Treatment Dates: 05/22/2019 through 06/20/2019 Site Technique Total Dose (Gy) Dose per Fx (Gy) Completed Fx Beam Energies  Breast, Left: Breast_Lt 3D 40.05/40.05 2.67 15/15 6X  Breast, Left: Breast_Lt_Bst specialPort 12/12 2 6/6 12E, 15E   Narrative: The patient tolerated radiation therapy relatively well. She did report an occasional shooting pain in her left breast, pruritis, and mild fatigue towards the end of treatment.  At the beginning of treatment, the left breast area lumpectomy scar was well healed without signs of drainage or infection. As treatment continued, the left breast area did undergo some hyperpigmentation changes without skin breakdown.   Plan: The patient will follow-up with radiation oncology in one month.  ________________________________________________   Blair Promise, PhD, MD  This document serves as a record of services personally performed by Gery Pray, MD. It was created on his behalf by Clerance Lav, a trained medical scribe. The creation of this record is based on the scribe's personal observations and the provider's statements to them. This document has been checked and approved by the attending provider.

## 2019-07-09 ENCOUNTER — Encounter: Payer: Self-pay | Admitting: Hematology & Oncology

## 2019-07-09 ENCOUNTER — Inpatient Hospital Stay (HOSPITAL_BASED_OUTPATIENT_CLINIC_OR_DEPARTMENT_OTHER): Payer: BC Managed Care – PPO | Admitting: Hematology & Oncology

## 2019-07-09 ENCOUNTER — Inpatient Hospital Stay: Payer: BC Managed Care – PPO | Attending: Genetic Counselor

## 2019-07-09 ENCOUNTER — Inpatient Hospital Stay: Payer: BC Managed Care – PPO

## 2019-07-09 ENCOUNTER — Other Ambulatory Visit: Payer: Self-pay | Admitting: *Deleted

## 2019-07-09 ENCOUNTER — Other Ambulatory Visit: Payer: Self-pay

## 2019-07-09 VITALS — BP 136/88 | HR 108 | Temp 97.1°F | Wt 195.0 lb

## 2019-07-09 DIAGNOSIS — N921 Excessive and frequent menstruation with irregular cycle: Secondary | ICD-10-CM | POA: Insufficient documentation

## 2019-07-09 DIAGNOSIS — D0512 Intraductal carcinoma in situ of left breast: Secondary | ICD-10-CM | POA: Insufficient documentation

## 2019-07-09 DIAGNOSIS — Z170 Estrogen receptor positive status [ER+]: Secondary | ICD-10-CM | POA: Insufficient documentation

## 2019-07-09 DIAGNOSIS — Z79899 Other long term (current) drug therapy: Secondary | ICD-10-CM | POA: Diagnosis not present

## 2019-07-09 DIAGNOSIS — Z79810 Long term (current) use of selective estrogen receptor modulators (SERMs): Secondary | ICD-10-CM | POA: Insufficient documentation

## 2019-07-09 DIAGNOSIS — Z923 Personal history of irradiation: Secondary | ICD-10-CM | POA: Diagnosis not present

## 2019-07-09 DIAGNOSIS — I2699 Other pulmonary embolism without acute cor pulmonale: Secondary | ICD-10-CM | POA: Diagnosis not present

## 2019-07-09 DIAGNOSIS — D500 Iron deficiency anemia secondary to blood loss (chronic): Secondary | ICD-10-CM | POA: Diagnosis not present

## 2019-07-09 DIAGNOSIS — Z791 Long term (current) use of non-steroidal anti-inflammatories (NSAID): Secondary | ICD-10-CM | POA: Insufficient documentation

## 2019-07-09 DIAGNOSIS — T81718S Complication of other artery following a procedure, not elsewhere classified, sequela: Secondary | ICD-10-CM | POA: Diagnosis not present

## 2019-07-09 DIAGNOSIS — C50212 Malignant neoplasm of upper-inner quadrant of left female breast: Secondary | ICD-10-CM

## 2019-07-09 DIAGNOSIS — Z86711 Personal history of pulmonary embolism: Secondary | ICD-10-CM | POA: Diagnosis not present

## 2019-07-09 LAB — CBC WITH DIFFERENTIAL (CANCER CENTER ONLY)
Abs Immature Granulocytes: 0.07 10*3/uL (ref 0.00–0.07)
Basophils Absolute: 0 10*3/uL (ref 0.0–0.1)
Basophils Relative: 1 %
Eosinophils Absolute: 0.2 10*3/uL (ref 0.0–0.5)
Eosinophils Relative: 5 %
HCT: 37.4 % (ref 36.0–46.0)
Hemoglobin: 11.8 g/dL — ABNORMAL LOW (ref 12.0–15.0)
Immature Granulocytes: 2 %
Lymphocytes Relative: 20 %
Lymphs Abs: 0.8 10*3/uL (ref 0.7–4.0)
MCH: 28.6 pg (ref 26.0–34.0)
MCHC: 31.6 g/dL (ref 30.0–36.0)
MCV: 90.6 fL (ref 80.0–100.0)
Monocytes Absolute: 0.4 10*3/uL (ref 0.1–1.0)
Monocytes Relative: 10 %
Neutro Abs: 2.4 10*3/uL (ref 1.7–7.7)
Neutrophils Relative %: 62 %
Platelet Count: 259 10*3/uL (ref 150–400)
RBC: 4.13 MIL/uL (ref 3.87–5.11)
RDW: 12.2 % (ref 11.5–15.5)
WBC Count: 3.8 10*3/uL — ABNORMAL LOW (ref 4.0–10.5)
nRBC: 0 % (ref 0.0–0.2)

## 2019-07-09 LAB — CMP (CANCER CENTER ONLY)
ALT: 20 U/L (ref 0–44)
AST: 15 U/L (ref 15–41)
Albumin: 3.9 g/dL (ref 3.5–5.0)
Alkaline Phosphatase: 74 U/L (ref 38–126)
Anion gap: 7 (ref 5–15)
BUN: 14 mg/dL (ref 6–20)
CO2: 29 mmol/L (ref 22–32)
Calcium: 9.7 mg/dL (ref 8.9–10.3)
Chloride: 102 mmol/L (ref 98–111)
Creatinine: 0.72 mg/dL (ref 0.44–1.00)
GFR, Est AFR Am: >60 mL/min (ref >60)
GFR, Estimated: >60 mL/min (ref >60)
Glucose, Bld: 100 mg/dL — ABNORMAL HIGH (ref 70–99)
Potassium: 3.7 mmol/L (ref 3.5–5.1)
Sodium: 138 mmol/L (ref 135–145)
Total Bilirubin: 0.4 mg/dL (ref 0.3–1.2)
Total Protein: 8.4 g/dL — ABNORMAL HIGH (ref 6.5–8.1)

## 2019-07-09 LAB — ANTITHROMBIN III: AntiThromb III Func: 97 % (ref 75–120)

## 2019-07-09 MED ORDER — TOREMIFENE CITRATE 60 MG PO TABS: 60.0000 mg | ORAL_TABLET | Freq: Every day | ORAL | 3 refills | Status: DC

## 2019-07-10 ENCOUNTER — Encounter: Payer: Self-pay | Admitting: *Deleted

## 2019-07-10 LAB — BETA-2-GLYCOPROTEIN I ABS, IGG/M/A
Beta-2 Glyco I IgG: <9 GPI IgG units (ref 0–20)
Beta-2-Glycoprotein I IgA: 10 GPI IgA units (ref 0–25)
Beta-2-Glycoprotein I IgM: 13 GPI IgM units (ref 0–32)

## 2019-07-10 LAB — PROTEIN C ACTIVITY: Protein C Activity: 102 % (ref 73–180)

## 2019-07-10 LAB — LUPUS ANTICOAGULANT PANEL
DRVVT: 29 s (ref 0.0–47.0)
PTT Lupus Anticoagulant: 36.2 s (ref 0.0–51.9)

## 2019-07-10 LAB — CARDIOLIPIN ANTIBODIES, IGG, IGM, IGA
Anticardiolipin IgA: <9 APL U/mL (ref 0–11)
Anticardiolipin IgG: 22 GPL U/mL — ABNORMAL HIGH (ref 0–14)
Anticardiolipin IgM: 24 MPL U/mL — ABNORMAL HIGH (ref 0–12)

## 2019-07-10 LAB — LUTEINIZING HORMONE: LH: 7.6 m[IU]/mL

## 2019-07-10 LAB — HOMOCYSTEINE: Homocysteine: 8.9 umol/L (ref 0.0–14.5)

## 2019-07-10 LAB — PROTEIN S ACTIVITY: Protein S Activity: 63 % (ref 63–140)

## 2019-07-10 LAB — FOLLICLE STIMULATING HORMONE: FSH: 11 m[IU]/mL

## 2019-07-10 LAB — PROTEIN S, TOTAL: Protein S Ag, Total: 82 % (ref 60–150)

## 2019-07-10 LAB — PROTEIN C, TOTAL: Protein C, Total: 87 % (ref 60–150)

## 2019-07-10 NOTE — Progress Notes (Signed)
Hematology and Oncology Follow Up Visit  [name] [id number] 01/20/1966 54 y.o. 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 daily-start on 07/08/2018  Radiation therapy to hopefully start in 2 weeks.     Interim History:  [name] is back for follow-up.  She is doing okay.  She completed all of her radiation therapy.  I am unsure of exactly how much radiation that she received.  She did have some skin breakdown from the radiation.  This seems to be healing up.  She apparently got a "second opinion" that her insurance allowed her to get.  This was done via computer.  I think her records were reviewed by an oncologist I think in [place].  The pathology was reviewed in [place].  The pathology review felt that there was some microinvasion.  I told her I do not think this will change how she is treated.  I would not think that she would need a sentinel biopsy given that this was a 1 mm area of microinvasion.  She is still premenopausal.  I told her that Fareston, in my opinion, would be as effective and probably safer than tamoxifen given that she had a pulmonary embolism several years ago.  The second opinion felt that she needed to have a hypercoagulable work-up.  As such, we went ahead and did this.  She has had no problems with pain.  She has had no nausea or vomiting.  She has had no cough.  There is been no change in bowel or bladder habits.  Her appetite has been pretty good.  We did check her estrogen levels today.  Her estradiol is pending.  She had a low FSH and LH which corroborate the fact that she is still premenopausal.  Overall, her performance status is ECOG 1.    Medications:  Current Outpatient Medications:  .  ALPRAZolam (XANAX) 0.25 MG tablet, Take 0.25 mg by mouth 2  (two) times daily as needed for anxiety. , Disp: , Rfl:  .  amLODipine-valsartan (EXFORGE) 10-320 MG tablet, Take 1 tablet by mouth daily. , Disp: , Rfl:  .  Ascorbic Acid (VITAMIN C) 1000 MG tablet, Take 1,000 mg by mouth daily. , Disp: , Rfl:  .  Cholecalciferol (VITAMIN D3) 125 MCG (5000 UT) CAPS, Take 5,000 Units by mouth daily. , Disp: , Rfl:  .  dexlansoprazole (DEXILANT) 60 MG capsule, Take 60 mg by mouth every morning. , Disp: , Rfl:  .  ferrous sulfate 325 (65 FE) MG tablet, Take 325 mg by mouth daily with breakfast. , Disp: , Rfl:  .  hydrochlorothiazide (HYDRODIURIL) 25 MG tablet, Take 25 mg by mouth daily. , Disp: , Rfl:  .  ibuprofen (ADVIL) 600 MG tablet, Take 600 mg by mouth every 6 (six) hours as needed., Disp: , Rfl:  .  ketoconazole (NIZORAL) 2 % cream, Apply 1 application topically daily as needed for irritation. , Disp: , Rfl:  .  oxyCODONE (OXY IR/ROXICODONE) 5 MG immediate release tablet, Take 1 tablet (5 mg total) by mouth every 6 (six) hours as needed for moderate pain or severe pain., Disp: 25 tablet, Rfl: 0 .  Probiotic Product (PROBIOTIC-10) CAPS, Take 1 capsule by mouth daily. , Disp: , Rfl:  .  SPIRULINA PO, Take 1 capsule  by mouth daily. , Disp: , Rfl:  .  tazarotene (TAZORAC) 0.05 % cream, Apply 1 application topically at bedtime as needed (acne). , Disp: , Rfl:  .  Toremifene Citrate (FARESTON) 60 MG tablet, Take 1 tablet (60 mg total) by mouth daily., Disp: 90 tablet, Rfl: 3  Allergies:  Allergies  Allergen Reactions  . Minocycline Hcl Hives and Itching  . Sulfa Antibiotics Swelling    Swell of the lips     Past Medical History, Surgical history, Social history, and Family History were reviewed and updated.  Review of Systems: Review of Systems  Constitutional: Negative.   HENT:  Negative.   Eyes: Negative.   Respiratory: Negative.   Cardiovascular: Negative.   Gastrointestinal: Negative.   Endocrine: Negative.   Genitourinary: Negative.      Musculoskeletal: Negative.   Skin: Negative.   Neurological: Negative.   Hematological: Negative.   Psychiatric/Behavioral: Negative.     Physical Exam:  weight is 195 lb (88.5 kg). Her oral temperature is 97.1 F (36.2 C) (abnormal). Her blood pressure is 136/88 and her pulse is 108 (abnormal). Her oxygen saturation is 100%.   Wt Readings from Last 3 Encounters:  07/09/19 195 lb (88.5 kg)  06/04/19 196 lb 12.8 oz (89.3 kg)  05/03/19 194 lb (88 kg)    Physical Exam Vitals reviewed.  Constitutional:      Comments: On her breast exam, right breast was without masses, edema or erythema.  There is no right axillary adenopathy.  Left breast is quite hyperpigmented from radiation.  There is some healing areas of skin from the radiation.  There is some contraction from radiation and  surgery.  She has the lumpectomy scar on the areola at about the 11 o'clock position.  There is no erythema.  There is no fullness.  There is no distinct mass in the left breast.  There is no left axillary adenopathy.  HENT:     Head: Normocephalic and atraumatic.  Eyes:     Pupils: Pupils are equal, round, and reactive to light.  Cardiovascular:     Rate and Rhythm: Normal rate and regular rhythm.     Heart sounds: Normal heart sounds.  Pulmonary:     Effort: Pulmonary effort is normal.     Breath sounds: Normal breath sounds.  Abdominal:     General: Bowel sounds are normal.     Palpations: Abdomen is soft.  Musculoskeletal:        General: No tenderness or deformity. Normal range of motion.     Cervical back: Normal range of motion.  Lymphadenopathy:     Cervical: No cervical adenopathy.  Skin:    General: Skin is warm and dry.     Findings: No erythema or rash.  Neurological:     Mental Status: She is alert and oriented to person, place, and time.  Psychiatric:        Behavior: Behavior normal.        Thought Content: Thought content normal.        Judgment: Judgment normal.      Lab  Results  Component Value Date   WBC 3.8 (L) 07/09/2019   HGB 11.8 (L) 07/09/2019   HCT 37.4 07/09/2019   MCV 90.6 07/09/2019   PLT 259 07/09/2019     Chemistry      Component Value Date/Time   NA 138 07/09/2019 0800   NA 142 04/12/2017 0858   K 3.7 07/09/2019 0800   K 3.8 04/12/2017  0858   CL 102 07/09/2019 0800   CL 104 04/12/2017 0858   CO2 29 07/09/2019 0800   CO2 26 04/12/2017 0858   BUN 14 07/09/2019 0800   BUN 9 04/12/2017 0858   CREATININE 0.72 07/09/2019 0800   CREATININE 0.8 04/12/2017 0858      Component Value Date/Time   CALCIUM 9.7 07/09/2019 0800   CALCIUM 9.2 04/12/2017 0858   ALKPHOS 74 07/09/2019 0800   ALKPHOS 74 04/12/2017 0858   AST 15 07/09/2019 0800   ALT 20 07/09/2019 0800   ALT 26 04/12/2017 0858   BILITOT 0.4 07/09/2019 0800      Impression and Plan: [name] is a 54 year old African-American female.  She now has ductal carcinoma in situ of the left breast with microinvasion.  Again, I really do not think this is going to change her protocol.  I would not get Oncotype on her.  She completed her radiation therapy.  Now we can start her on systemic antiestrogen therapy.  Again, I do think that [name] is as effective as tamoxifen with the less incidence of thromboembolic disease.  I told her that [name] is used much more in [place] than it is in the [place].  I told her once she is postmenopausal, then we can switch her over to an aromatase inhibitor.  We will see what her hypercoagulable studies show.  She will start the Fareston this week.  We called the medicine in  today.  I will see her back in about a month.  I spent about 40-45 minutes with her today.  She had a lot of questions she wanted to ask.  I wanted to take the time with her so that we took care of her questions.  I does want her to feel confident that she is getting the appropriate therapy.  She felt better once we finished the appointment.  She has a strong faith which  definitely will help her.    [name], MD 2/23/20216:49 AM

## 2019-07-11 ENCOUNTER — Encounter: Payer: Self-pay | Admitting: *Deleted

## 2019-07-12 LAB — ESTRADIOL, ULTRA SENS: Estradiol, Sensitive: 48.4 pg/mL

## 2019-07-13 LAB — FACTOR 5 LEIDEN

## 2019-07-16 ENCOUNTER — Ambulatory Visit: Payer: BC Managed Care – PPO | Admitting: Radiation Oncology

## 2019-07-16 ENCOUNTER — Encounter: Payer: Self-pay | Admitting: *Deleted

## 2019-07-16 LAB — PROTHROMBIN GENE MUTATION

## 2019-07-16 NOTE — Addendum Note (Signed)
Addended by: Burney Gauze R on: 07/16/2019 04:28 PM   Modules accepted: Orders

## 2019-07-18 ENCOUNTER — Telehealth: Payer: Self-pay | Admitting: *Deleted

## 2019-07-18 NOTE — Telephone Encounter (Signed)
CALLED PATIENT TO ASK ABOUT COMING IN FOR FU @ 8:30 AM ON 07-19-19, SPOKE WITH PATIENT AND SHE AGREED TO COME @ 8:30 AM ON 07-19-19

## 2019-07-18 NOTE — Progress Notes (Signed)
Radiation Oncology         [phone] ________________________________  Name: [name] MRN: [id number]  Date: 07/19/2019  DOB: 1966-04-25  Follow-Up Visit Note  CC: Elwyn Reach, MD  Elwyn Reach, MD    ICD-10-CM   1. Malignant neoplasm of upper-inner quadrant of left breast in female, estrogen receptor positive [facility])  C50.212    Z17.0     Diagnosis:   Stage0 LeftBreast UIQ,DCIS, ER+/ PR+, Grade3  Interval Since Last Radiation:  1 month 05/22/2019 through 06/20/2019 Site Technique Total Dose (Gy) Dose per Fx (Gy) Completed Fx Beam Energies  Breast, Left: Breast_Lt 3D 40.05/40.05 2.67 15/15 6X  Breast, Left: Breast_Lt_Bst specialPort 12/12 2 6/6 12E, 15E    Narrative:  The patient returns today for routine follow-up. She last saw Dr. Marin Olp for follow up on 07/09/2019 and was started on Fareston.   On review of systems, she has mild fatigue but overall is doing well.  She denies any further problems with itching within the left breast.  She denies any cough or breathing problems.  ALLERGIES:  is allergic to minocycline hcl and sulfa antibiotics.  Meds: Current Outpatient Medications  Medication Sig Dispense Refill  . ALPRAZolam (XANAX) 0.25 MG tablet Take 0.25 mg by mouth 2 (two) times daily as needed for anxiety.     Marland Kitchen amLODipine-valsartan (EXFORGE) 10-320 MG tablet Take 1 tablet by mouth daily.     . Ascorbic Acid (VITAMIN C) 1000 MG tablet Take 1,000 mg by mouth daily.     . Cholecalciferol (VITAMIN D3) 125 MCG (5000 UT) CAPS Take 5,000 Units by mouth daily.     . Dermatological Products, Misc. [facility]) lotion EpiCeram topical emulsion, extended release  APPLY TO LEFT CHEST THROUGHOUT THE DAY    . dexlansoprazole (DEXILANT) 60 MG capsule Take 60 mg by mouth every morning.     . ferrous sulfate 325 (65 FE) MG tablet Take 325 mg by mouth daily with breakfast.     . hydrochlorothiazide (HYDRODIURIL) 25 MG tablet Take 25 mg by mouth daily.     Marland Kitchen  ibuprofen (ADVIL) 600 MG tablet Take 600 mg by mouth every 6 (six) hours as needed.    Marland Kitchen ketoconazole (NIZORAL) 2 % cream Apply 1 application topically daily as needed for irritation.     Marland Kitchen oxyCODONE (OXY IR/ROXICODONE) 5 MG immediate release tablet Take 1 tablet (5 mg total) by mouth every 6 (six) hours as needed for moderate pain or severe pain. 25 tablet 0  . Probiotic Product (PROBIOTIC-10) CAPS Take 1 capsule by mouth daily.     Marland Kitchen SPIRULINA PO Take 1 capsule by mouth daily.     . tazarotene (TAZORAC) 0.05 % cream Apply 1 application topically at bedtime as needed (acne).     . Toremifene Citrate (FARESTON) 60 MG tablet Take 1 tablet (60 mg total) by mouth daily. 90 tablet 3   No current facility-administered medications for this encounter.    Physical Findings: The patient is in no acute distress. Patient is alert and oriented.  weight is 196 lb 3.2 oz (89 kg). Her temperature is 97.8 F (36.6 C). Her blood pressure is 125/74 and her pulse is 69. Her respiration is 20 and oxygen saturation is 100%. .  No significant changes. Lungs are clear to auscultation bilaterally. Heart has regular rate and rhythm. No palpable cervical, supraclavicular, or axillary adenopathy. Abdomen soft, non-tender, normal bowel sounds. Right Breast: no palpable mass, nipple discharge or bleeding. Left Breast:  Skin is healed well.  She has some residual hyperpigmentation changes.  The breast has softened up quite a bit with less edema.  No nipple discharge or bleeding.  No dominant mass appreciated in the breast  Lab Findings: Lab Results  Component Value Date   WBC 3.8 (L) 07/09/2019   HGB 11.8 (L) 07/09/2019   HCT 37.4 07/09/2019   MCV 90.6 07/09/2019   PLT 259 07/09/2019    Radiographic Findings: No results found.  Impression:  Left Breast DCIS with microinvasion  The patient is recovering from the effects of radiation.  Overall she is doing well at this point.  Plan: As needed follow-up in  radiation oncology.  She will continue close follow-up medical oncology and general surgery.  She is not taking tamoxifen in light of her history of blood clots.  She is on Fareston instead.  ____________________________________ Gery Pray, MD    This document serves as a record of services personally performed by Gery Pray, MD. It was created on his behalf by Wilburn Mylar, a trained medical scribe. The creation of this record is based on the scribe's personal observations and the provider's statements to them. This document has been checked and approved by the attending provider.

## 2019-07-19 ENCOUNTER — Encounter: Payer: Self-pay | Admitting: Radiation Oncology

## 2019-07-19 ENCOUNTER — Ambulatory Visit
Admission: RE | Admit: 2019-07-19 | Discharge: 2019-07-19 | Disposition: A | Discharge status: Home / Self Care | Payer: BC Managed Care – PPO | Source: Ambulatory Visit | Attending: Radiation Oncology | Admitting: Radiation Oncology

## 2019-07-19 ENCOUNTER — Other Ambulatory Visit: Payer: Self-pay

## 2019-07-19 VITALS — BP 125/74 | HR 69 | Temp 97.8°F | Resp 20 | Wt 196.2 lb

## 2019-07-19 DIAGNOSIS — Z923 Personal history of irradiation: Secondary | ICD-10-CM | POA: Diagnosis not present

## 2019-07-19 DIAGNOSIS — R5383 Other fatigue: Secondary | ICD-10-CM | POA: Diagnosis not present

## 2019-07-19 DIAGNOSIS — C50212 Malignant neoplasm of upper-inner quadrant of left female breast: Secondary | ICD-10-CM

## 2019-07-19 DIAGNOSIS — Z170 Estrogen receptor positive status [ER+]: Secondary | ICD-10-CM | POA: Insufficient documentation

## 2019-07-19 DIAGNOSIS — Z79899 Other long term (current) drug therapy: Secondary | ICD-10-CM | POA: Insufficient documentation

## 2019-07-19 DIAGNOSIS — D0512 Intraductal carcinoma in situ of left breast: Secondary | ICD-10-CM | POA: Insufficient documentation

## 2019-07-19 NOTE — Progress Notes (Signed)
Ms. Demus presents today for f/u with Dr. Sondra Come. Pt reports that fatigue from XRT has resolved. Pt reports intermittent breast pain, shooting, does not take OTC pain relievers. Pt has seen dermatologist and is using EpiCeram on breast. Breast with slight hyperpigmentation.  BP 125/74 (BP Location: Right Arm, Patient Position: Sitting, Cuff Size: Normal)   Pulse 69   Temp 97.8 F (36.6 C)   Resp 20   Wt 196 lb 3.2 oz (89 kg)   SpO2 100%   BMI 32.65 kg/m   Wt Readings from Last 3 Encounters:  07/19/19 196 lb 3.2 oz (89 kg)  07/09/19 195 lb (88.5 kg)  06/04/19 196 lb 12.8 oz (89.3 kg)   Loma Sousa, RN BSN

## 2019-07-19 NOTE — Patient Instructions (Signed)
Coronavirus (COVID-19) Are you at risk?  Are you at risk for the Coronavirus (COVID-19)?  To be considered HIGH RISK for Coronavirus (COVID-19), you have to meet the following criteria:  . Traveled to [place], [place], [place], [place] or [place]; or in the [place] to [place], [place], [place], or [place]; and have fever, cough, and shortness of breath within the last 2 weeks of travel OR . Been in close contact with a person diagnosed with COVID-19 within the last 2 weeks and have fever, cough, and shortness of breath . IF YOU DO NOT MEET THESE CRITERIA, YOU ARE CONSIDERED LOW RISK FOR COVID-19.  What to do if you are HIGH RISK for COVID-19?  . If you are having a medical emergency, call 911. . Seek medical care right away. Before you go to a doctor's office, urgent care or emergency department, call ahead and tell them about your recent travel, contact with someone diagnosed with COVID-19, and your symptoms. You should receive instructions from your physician's office regarding next steps of care.  . When you arrive at healthcare provider, tell the healthcare staff immediately you have returned from visiting [place], [place], [place], [place] or [place]; or traveled in the [place] to [place], [place], [place], or [place]; in the last two weeks or you have been in close contact with a person diagnosed with COVID-19 in the last 2 weeks.   . Tell the health care staff about your symptoms: fever, cough and shortness of breath. . After you have been seen by a medical provider, you will be either: o Tested for (COVID-19) and discharged home on quarantine except to seek medical care if symptoms worsen, and asked to  - Stay home and avoid contact with others until you get your results (4-5 days)  - Avoid travel on public transportation if possible (such as bus, train, or airplane) or o Sent to the Emergency Department by EMS for evaluation, COVID-19 testing, and possible  admission depending on your condition and test results.  What to do if you are LOW RISK for COVID-19?  Reduce your risk of any infection by using the same precautions used for avoiding the common cold or flu:  . Wash your hands often with soap and warm water for at least 20 seconds.  If soap and water are not readily available, use an alcohol-based hand sanitizer with at least 60% alcohol.  . If coughing or sneezing, cover your mouth and nose by coughing or sneezing into the elbow areas of your shirt or coat, into a tissue or into your sleeve (not your hands). . Avoid shaking hands with others and consider head nods or verbal greetings only. . Avoid touching your eyes, nose, or mouth with unwashed hands.  . Avoid close contact with people who are sick. . Avoid places or events with large numbers of people in one location, like concerts or sporting events. . Carefully consider travel plans you have or are making. . If you are planning any travel outside or inside the [place], visit the CDC's Travelers' Health webpage for the latest health notices. . If you have some symptoms but not all symptoms, continue to monitor at home and seek medical attention if your symptoms worsen. . If you are having a medical emergency, call 911.   ADDITIONAL HEALTHCARE OPTIONS FOR PATIENTS  [facility] / e-Visit: [email or web address]         [facility]: [phone]  [facility]: [phone]                   [facility]: [phone]   

## 2019-08-03 ENCOUNTER — Ambulatory Visit (INDEPENDENT_AMBULATORY_CARE_PROVIDER_SITE_OTHER): Payer: BC Managed Care – PPO | Admitting: Obstetrics & Gynecology

## 2019-08-03 ENCOUNTER — Other Ambulatory Visit: Payer: Self-pay

## 2019-08-03 ENCOUNTER — Other Ambulatory Visit (HOSPITAL_COMMUNITY)
Admission: RE | Admit: 2019-08-03 | Discharge: 2019-08-03 | Disposition: A | Discharge status: Home / Self Care | Payer: BC Managed Care – PPO | Source: Ambulatory Visit | Attending: Obstetrics & Gynecology | Admitting: Obstetrics & Gynecology

## 2019-08-03 ENCOUNTER — Encounter: Payer: Self-pay | Admitting: Obstetrics & Gynecology

## 2019-08-03 VITALS — BP 126/72 | HR 72 | Temp 96.3°F | Resp 12 | Ht 65.0 in | Wt 199.0 lb

## 2019-08-03 DIAGNOSIS — Z01411 Encounter for gynecological examination (general) (routine) with abnormal findings: Secondary | ICD-10-CM

## 2019-08-03 DIAGNOSIS — Z124 Encounter for screening for malignant neoplasm of cervix: Secondary | ICD-10-CM | POA: Insufficient documentation

## 2019-08-03 NOTE — Progress Notes (Signed)
54 y.o. G22P0030 Married [name] American female here for annual exam.  Reports she is doing really well.  Has finished with radiation.  Skin is healing really well.    Did have a second opinion about her care.  She prefers to take Tamoxifen but has hx of PE.  Had blood work done with Dr. Marin Olp.  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  Did have genetic testing.  This was negative.  It was done 03/16/2019.  Cycles have started to skip a month.  Cycles are still "normal" with flow.    Patient's last menstrual period was 07/02/2019 (within days).          Sexually active: Yes.    The current method of family planning is none.    Exercising: Yes.    walking Smoker:  no  Health Maintenance: Pap:    03/31/18 Neg  01/23/16 neg. HR HPV:Neg              10/28/14 Neg  History of abnormal Pap:  yes MMG:  September and October 2020 - patient had left breast lumpectomy Colonoscopy:  February 2021 - normal with Dr. Collene Mares.  Follow up 10 years. BMD:   never TDaP:  2014 Pneumonia vaccine(s):  n/a Shingrix:   Declines shingrix vaccination Hep C testing: 10/28/14 Neg Screening Labs: Hematology   reports that she has never smoked. She has never used smokeless tobacco. She reports current alcohol use. She reports that she does not use drugs.  Past Medical History:  Diagnosis Date  . Abnormal Pap smear 7/13   ASCUS/positive HR HPV, negative colpo  . Anemia 2006  . Cancer ([place])    Breast- left  . 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 Family history of cancer   . Fibroids    with enlarged uterus  . GERD (gastroesophageal reflux disease)   . Hypertension   . Pulmonary embolism ([place]) 2008   secondary to OCP  . Pulmonary embolism ([place]) 2009ish  . 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 BREAST LUMPECTOMY WITH RADIOACTIVE SEED LOCALIZATION Left 03/20/2019   Procedure: LEFT BREAST LUMPECTOMY WITH RADIOACTIVE  SEED LOCALIZATION;  Surgeon: Coralie Keens, MD;  Location: [facility];  Service: General;  Laterality: Left;  . ESOPHAGOGASTRODUODENOSCOPY  2020  . IR GENERIC HISTORICAL  03/31/2016   IR RADIOLOGIST EVAL & MGMT 03/31/2016 Marybelle Killings, MD [facility] INTERV RAD  . IR GENERIC HISTORICAL  01/06/2016   IR RADIOLOGIST EVAL & MGMT 01/06/2016 Marybelle Killings, MD [facility] INTERV RAD  . left finger reattachment  age 37  . LEFT OOPHORECTOMY Left 1998      . MYOMECTOMY  2003  . MYOMECTOMY  2008   via laparoscopy  . RE-EXCISION OF BREAST CANCER,SUPERIOR MARGINS Left 04/06/2019   Procedure: RE-EXCISION OF LEFT BREAST CANCER MARGINS;  Surgeon: Coralie Keens, MD;  Location: [facility];  Service: General;  Laterality: Left;    Current Outpatient Medications  Medication Sig Dispense Refill  . amLODipine-valsartan (EXFORGE) 10-320 MG tablet Take 1 tablet by mouth daily.     . Dermatological Products, Misc. [facility]) lotion EpiCeram topical emulsion, extended release  APPLY TO LEFT CHEST THROUGHOUT THE DAY    . hydrochlorothiazide (HYDRODIURIL) 25 MG tablet Take 25 mg by mouth daily.     Marland Kitchen ibuprofen (ADVIL) 600 MG tablet Take 600 mg by mouth every 6 (six) hours as needed.    Marland Kitchen ketoconazole (NIZORAL) 2 %  cream Apply 1 application topically daily as needed for irritation.     . Toremifene Citrate (FARESTON) 60 MG tablet Take 1 tablet (60 mg total) by mouth daily. 90 tablet 3  . ALPRAZolam (XANAX) 0.25 MG tablet Take 0.25 mg by mouth 2 (two) times daily as needed for anxiety.     . Ascorbic Acid (VITAMIN C) 1000 MG tablet Take 1,000 mg by mouth daily.     . Cholecalciferol (VITAMIN D3) 125 MCG (5000 UT) CAPS Take 5,000 Units by mouth daily.     Marland Kitchen dexlansoprazole (DEXILANT) 60 MG capsule Take 60 mg by mouth every morning.     . ferrous sulfate 325 (65 FE) MG tablet Take 325 mg by mouth daily with breakfast.     . Probiotic Product (PROBIOTIC-10) CAPS Take 1 capsule by mouth daily.     Marland Kitchen SPIRULINA PO Take 1 capsule by  mouth daily.     . tazarotene (TAZORAC) 0.05 % cream Apply 1 application topically at bedtime as needed (acne).      No current facility-administered medications for this visit.    Family History  Problem Relation Age of Onset  . Diabetes Mother   . Hypertension Mother   . Heart disease Mother        cardiomegaly  . Lupus Mother   . Diabetes Father   . Hypertension Father   . Stroke Father   . Hypertension Maternal Grandmother   . Stroke Maternal Grandmother   . Hypertension Sister   . Seizures Sister   . COPD Sister   . Ovarian cancer Paternal Aunt     Review of Systems  All other systems reviewed and are negative.   Exam:   BP 126/72 (BP Location: Right Arm, Patient Position: Sitting, Cuff Size: Normal)   Pulse 72   Temp (!) 96.3 F (35.7 C) (Temporal)   Resp 12   Ht 5\' 5"  (1.651 m)   Wt 199 lb (90.3 kg)   LMP 07/02/2019 (Within Days)   BMI 33.12 kg/m   Height: 5\' 5"  (165.1 cm)  Ht Readings from Last 3 Encounters:  08/03/19 5\' 5"  (1.651 m)  06/04/19 5\' 5"  (1.651 m)  05/03/19 5\' 5"  (1.651 m)    General appearance: alert, cooperative and appears stated age Head: Normocephalic, without obvious abnormality, atraumatic Neck: no adenopathy, supple, symmetrical, trachea midline and thyroid normal to inspection and palpation Lungs: clear to auscultation bilaterally Breasts: normal appearance, no masses or tenderness Heart: regular rate and rhythm Abdomen: soft, non-tender; bowel sounds normal; no masses,  no organomegaly Extremities: extremities normal, atraumatic, no cyanosis or edema Skin: Skin color, texture, turgor normal. No rashes or lesions Lymph nodes: Cervical, supraclavicular, and axillary nodes normal. No abnormal inguinal nodes palpated Neurologic: Grossly normal   Pelvic: External genitalia:  no lesions              Urethra:  normal appearing urethra with no masses, tenderness or lesions              Bartholins and Skenes: normal                  Vagina: normal appearing vagina with normal color and discharge, no lesions              Cervix: no lesions              Pap taken: Yes.   Bimanual Exam:  Uterus:  12-14 in size, stable, mobile  Adnexa: normal adnexa and no mass, fullness, tenderness               Rectovaginal: Confirms               Anus:  normal sphincter tone, no lesions  Chaperone, Terence Lux, CMA, was present for exam.  A:  Well Woman with normal exam H/o uterine fibroids s/p [place] 5/17, myomectomy x 2 in 2003, 2008 Hypertension Left breast cancer H/o condyloma Iron deficiency anemia H/o PE ASCUS pap with +HR HPV 2013.  Neg pap with neg HR HPV 2015 and 2017.  P:   Mammogram guidelines reviewed.  Will continue with yearly. pap smear with HR HPV obtained today Colonoscopy release signed today Does not need blood work today Is scheduled for Covid vaccination on Sunday Declines shingrix vaccination today Return annually or prn

## 2019-08-06 ENCOUNTER — Encounter: Payer: Self-pay | Admitting: Certified Nurse Midwife

## 2019-08-06 DIAGNOSIS — K219 Gastro-esophageal reflux disease without esophagitis: Secondary | ICD-10-CM | POA: Insufficient documentation

## 2019-08-06 LAB — CYTOLOGY - PAP
Adequacy: ABSENT
Comment: NEGATIVE
Diagnosis: NEGATIVE
High risk HPV: NEGATIVE

## 2019-08-07 ENCOUNTER — Inpatient Hospital Stay: Payer: BC Managed Care – PPO | Attending: Genetic Counselor | Admitting: Hematology & Oncology

## 2019-08-07 ENCOUNTER — Inpatient Hospital Stay: Payer: BC Managed Care – PPO

## 2019-08-07 ENCOUNTER — Other Ambulatory Visit: Payer: Self-pay

## 2019-08-07 ENCOUNTER — Encounter: Payer: Self-pay | Admitting: Hematology & Oncology

## 2019-08-07 VITALS — BP 125/71 | HR 67 | Temp 97.1°F | Resp 16 | Ht 65.0 in | Wt 198.1 lb

## 2019-08-07 DIAGNOSIS — Z923 Personal history of irradiation: Secondary | ICD-10-CM | POA: Diagnosis not present

## 2019-08-07 DIAGNOSIS — C50212 Malignant neoplasm of upper-inner quadrant of left female breast: Secondary | ICD-10-CM

## 2019-08-07 DIAGNOSIS — Z79810 Long term (current) use of selective estrogen receptor modulators (SERMs): Secondary | ICD-10-CM | POA: Insufficient documentation

## 2019-08-07 DIAGNOSIS — Z791 Long term (current) use of non-steroidal anti-inflammatories (NSAID): Secondary | ICD-10-CM | POA: Insufficient documentation

## 2019-08-07 DIAGNOSIS — D500 Iron deficiency anemia secondary to blood loss (chronic): Secondary | ICD-10-CM | POA: Diagnosis not present

## 2019-08-07 DIAGNOSIS — Z170 Estrogen receptor positive status [ER+]: Secondary | ICD-10-CM | POA: Diagnosis not present

## 2019-08-07 DIAGNOSIS — N921 Excessive and frequent menstruation with irregular cycle: Secondary | ICD-10-CM | POA: Diagnosis not present

## 2019-08-07 DIAGNOSIS — D0512 Intraductal carcinoma in situ of left breast: Secondary | ICD-10-CM | POA: Diagnosis present

## 2019-08-07 DIAGNOSIS — Z79899 Other long term (current) drug therapy: Secondary | ICD-10-CM | POA: Diagnosis not present

## 2019-08-07 DIAGNOSIS — I2699 Other pulmonary embolism without acute cor pulmonale: Secondary | ICD-10-CM

## 2019-08-07 LAB — CBC WITH DIFFERENTIAL (CANCER CENTER ONLY)
Abs Immature Granulocytes: 0.08 10*3/uL — ABNORMAL HIGH (ref 0.00–0.07)
Basophils Absolute: 0 10*3/uL (ref 0.0–0.1)
Basophils Relative: 1 %
Eosinophils Absolute: 0.1 10*3/uL (ref 0.0–0.5)
Eosinophils Relative: 3 %
HCT: 36.2 % (ref 36.0–46.0)
Hemoglobin: 11.5 g/dL — ABNORMAL LOW (ref 12.0–15.0)
Immature Granulocytes: 2 %
Lymphocytes Relative: 21 %
Lymphs Abs: 0.8 10*3/uL (ref 0.7–4.0)
MCH: 28.8 pg (ref 26.0–34.0)
MCHC: 31.8 g/dL (ref 30.0–36.0)
MCV: 90.7 fL (ref 80.0–100.0)
Monocytes Absolute: 0.5 10*3/uL (ref 0.1–1.0)
Monocytes Relative: 12 %
Neutro Abs: 2.4 10*3/uL (ref 1.7–7.7)
Neutrophils Relative %: 61 %
Platelet Count: 260 10*3/uL (ref 150–400)
RBC: 3.99 MIL/uL (ref 3.87–5.11)
RDW: 12.7 % (ref 11.5–15.5)
WBC Count: 3.9 10*3/uL — ABNORMAL LOW (ref 4.0–10.5)
nRBC: 0 % (ref 0.0–0.2)

## 2019-08-07 LAB — CMP (CANCER CENTER ONLY)
ALT: 17 U/L (ref 0–44)
AST: 12 U/L — ABNORMAL LOW (ref 15–41)
Albumin: 3.6 g/dL (ref 3.5–5.0)
Alkaline Phosphatase: 52 U/L (ref 38–126)
Anion gap: 6 (ref 5–15)
BUN: 18 mg/dL (ref 6–20)
CO2: 29 mmol/L (ref 22–32)
Calcium: 9.2 mg/dL (ref 8.9–10.3)
Chloride: 104 mmol/L (ref 98–111)
Creatinine: 0.74 mg/dL (ref 0.44–1.00)
GFR, Est AFR Am: >60 mL/min (ref >60)
GFR, Estimated: >60 mL/min (ref >60)
Glucose, Bld: 102 mg/dL — ABNORMAL HIGH (ref 70–99)
Potassium: 3.6 mmol/L (ref 3.5–5.1)
Sodium: 139 mmol/L (ref 135–145)
Total Bilirubin: 0.4 mg/dL (ref 0.3–1.2)
Total Protein: 7.6 g/dL (ref 6.5–8.1)

## 2019-08-07 MED ORDER — FOLIC ACID 1 MG PO TABS: 1.0000 mg | ORAL_TABLET | Freq: Every day | ORAL | 3 refills | Status: DC

## 2019-08-07 NOTE — Progress Notes (Signed)
Hematology and Oncology Follow Up Visit  [name] [id number] 1966-02-09 54 y.o. 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 daily-start on 07/08/2018  Radiation therapy -- completed 5200 rad - on 06/20/2019     Interim History:  [name] is back for follow-up.  She is doing okay.  She completed all of her radiation therapy.  She looks great.  She has healed up nicely from the radiation therapy.  She did start the Fareston.  I told her that I is felt that the [name] would be a [place] bit safer for her with less incidence of thromboembolic disease.  We have not found any thrombophilic state that she has that would predispose her to blood clots.  We repeated her lupus anticoagulant panel today.  This was still negative for a lupus anticoagulant.  We checked her for homocysteine.  She has a normal homocystine level of 8.9.  She has had no problems with nausea or vomiting.  She has had no cough.  There is been no shortness of breath.  She has had no change in bowel or bladder habits.  Her appetite has been pretty good.  Overall, her performance status is ECOG 1.    Medications:  Current Outpatient Medications:  .  ALPRAZolam (XANAX) 0.25 MG tablet, Take 0.25 mg by mouth 2 (two) times daily as needed for anxiety. , Disp: , Rfl:  .  amLODipine-valsartan (EXFORGE) 10-320 MG tablet, Take 1 tablet by mouth daily. , Disp: , Rfl:  .  Ascorbic Acid (VITAMIN C) 1000 MG tablet, Take 1,000 mg by mouth daily. , Disp: , Rfl:  .  Cholecalciferol (VITAMIN D3) 125 MCG (5000 UT) CAPS, Take 5,000 Units by mouth daily. , Disp: , Rfl:  .  Dermatological Products, Misc. [facility]) lotion, EpiCeram topical emulsion, extended release  APPLY TO LEFT CHEST THROUGHOUT THE DAY, Disp: , Rfl:  .  dexlansoprazole  (DEXILANT) 60 MG capsule, Take 60 mg by mouth every morning. , Disp: , Rfl:  .  ferrous sulfate 325 (65 FE) MG tablet, Take 325 mg by mouth daily with breakfast. , Disp: , Rfl:  .  hydrochlorothiazide (HYDRODIURIL) 25 MG tablet, Take 25 mg by mouth daily. , Disp: , Rfl:  .  ibuprofen (ADVIL) 600 MG tablet, Take 600 mg by mouth every 6 (six) hours as needed., Disp: , Rfl:  .  ketoconazole (NIZORAL) 2 % cream, Apply 1 application topically daily as needed for irritation. , Disp: , Rfl:  .  Probiotic Product (PROBIOTIC-10) CAPS, Take 1 capsule by mouth daily. , Disp: , Rfl:  .  SPIRULINA PO, Take 1 capsule by mouth daily. , Disp: , Rfl:  .  tazarotene (TAZORAC) 0.05 % cream, Apply 1 application topically at bedtime as needed (acne). , Disp: , Rfl:  .  Toremifene Citrate (FARESTON) 60 MG tablet, Take 1 tablet (60 mg total) by mouth daily., Disp: 90 tablet, Rfl: 3  Allergies:  Allergies  Allergen Reactions  . Minocycline Hcl Hives and Itching  . Sulfa Antibiotics Swelling    Swell of the lips     Past Medical History, Surgical history, Social history, and Family History were reviewed and updated.  Review of Systems: Review of Systems  Constitutional: Negative.   HENT:  Negative.   Eyes: Negative.  Respiratory: Negative.   Cardiovascular: Negative.   Gastrointestinal: Negative.   Endocrine: Negative.   Genitourinary: Negative.    Musculoskeletal: Negative.   Skin: Negative.   Neurological: Negative.   Hematological: Negative.   Psychiatric/Behavioral: Negative.     Physical Exam:  height is 5\' 5"  (1.651 m) and weight is 198 lb 1.9 oz (89.9 kg). Her temporal temperature is 97.1 F (36.2 C) (abnormal). Her blood pressure is 125/71 and her pulse is 67. Her respiration is 16 and oxygen saturation is 100%.   Wt Readings from Last 3 Encounters:  08/07/19 198 lb 1.9 oz (89.9 kg)  08/03/19 199 lb (90.3 kg)  07/19/19 196 lb 3.2 oz (89 kg)    Physical Exam Vitals reviewed.    Constitutional:      Comments: On her breast exam, right breast was without masses, edema or erythema.  There is no right axillary adenopathy.  Left breast is quite hyperpigmented from radiation.  There is some healing areas of skin from the radiation.  There is some contraction from radiation and  surgery.  She has the lumpectomy scar on the areola at about the 11 o'clock position.  There is no erythema.  There is no fullness.  There is no distinct mass in the left breast.  There is no left axillary adenopathy.  HENT:     Head: Normocephalic and atraumatic.  Eyes:     Pupils: Pupils are equal, round, and reactive to light.  Cardiovascular:     Rate and Rhythm: Normal rate and regular rhythm.     Heart sounds: Normal heart sounds.  Pulmonary:     Effort: Pulmonary effort is normal.     Breath sounds: Normal breath sounds.  Abdominal:     General: Bowel sounds are normal.     Palpations: Abdomen is soft.  Musculoskeletal:        General: No tenderness or deformity. Normal range of motion.     Cervical back: Normal range of motion.  Lymphadenopathy:     Cervical: No cervical adenopathy.  Skin:    General: Skin is warm and dry.     Findings: No erythema or rash.  Neurological:     Mental Status: She is alert and oriented to person, place, and time.  Psychiatric:        Behavior: Behavior normal.        Thought Content: Thought content normal.        Judgment: Judgment normal.      Lab Results  Component Value Date   WBC 3.9 (L) 08/07/2019   HGB 11.5 (L) 08/07/2019   HCT 36.2 08/07/2019   MCV 90.7 08/07/2019   PLT 260 08/07/2019     Chemistry      Component Value Date/Time   NA 139 08/07/2019 0808   NA 142 04/12/2017 0858   K 3.6 08/07/2019 0808   K 3.8 04/12/2017 0858   CL 104 08/07/2019 0808   CL 104 04/12/2017 0858   CO2 29 08/07/2019 0808   CO2 26 04/12/2017 0858   BUN 18 08/07/2019 0808   BUN 9 04/12/2017 0858   CREATININE 0.74 08/07/2019 0808   CREATININE  0.8 04/12/2017 0858      Component Value Date/Time   CALCIUM 9.2 08/07/2019 0808   CALCIUM 9.2 04/12/2017 0858   ALKPHOS 52 08/07/2019 0808   ALKPHOS 74 04/12/2017 0858   AST 12 (L) 08/07/2019 0808   ALT 17 08/07/2019 0808   ALT 26 04/12/2017 0858  BILITOT 0.4 08/07/2019 [id number]      Impression and Plan: Ms. Dahler is a 54 year old African-American female.  She now has ductal carcinoma in situ of the left breast with microinvasion.  Again, I really do not think this is going to change her protocol.  I would not get Oncotype on her.  She completed her radiation therapy.  She is on Fareston.  Again, I think [name] is very appropriate for her given that she has had this history of thromboembolic disease and Fareston has less of a risk of thromboembolic development then tamoxifen.  I explained this to she and her husband.  I know that the "label" for [name] says for metastatic disease.  However, I know that in [place], it is used much more often than tamoxifen for adjuvant therapy for premenopausal women with estrogen positive breast cancer.  I told her that we could try to get some information from the company regarding Fareston.  I talked to her pharmacist about this.  I know she is trying to work on this.  We will plan to get Ms. Granito back in another 12 weeks.  Hopefully, if things look good at that point, we can move her appointments out further.  I spent about 40 minutes with Ms. Marlar and her husband today.  It is always nice to talk with him.  There are incredibly intelligent and was asked very good questions.      [name], MD 3/23/20219:03 AM

## 2019-08-08 LAB — LUPUS ANTICOAGULANT PANEL
DRVVT: 30.2 s (ref 0.0–47.0)
PTT Lupus Anticoagulant: 35.4 s (ref 0.0–51.9)

## 2019-08-08 LAB — CARDIOLIPIN ANTIBODIES, IGG, IGM, IGA
Anticardiolipin IgA: <9 APL U/mL (ref 0–11)
Anticardiolipin IgG: 11 GPL U/mL (ref 0–14)
Anticardiolipin IgM: 20 MPL U/mL — ABNORMAL HIGH (ref 0–12)

## 2019-08-10 ENCOUNTER — Encounter: Payer: Self-pay | Admitting: *Deleted

## 2019-10-22 ENCOUNTER — Other Ambulatory Visit: Payer: Self-pay

## 2019-10-22 ENCOUNTER — Emergency Department (HOSPITAL_COMMUNITY)
Admission: EM | Admit: 2019-10-22 | Discharge: 2019-10-22 | Disposition: A | Discharge status: Home / Self Care | Payer: BC Managed Care – PPO | Attending: Emergency Medicine | Admitting: Emergency Medicine

## 2019-10-22 ENCOUNTER — Emergency Department (HOSPITAL_COMMUNITY): Payer: BC Managed Care – PPO

## 2019-10-22 ENCOUNTER — Encounter (HOSPITAL_COMMUNITY): Payer: Self-pay

## 2019-10-22 DIAGNOSIS — R29898 Other symptoms and signs involving the musculoskeletal system: Secondary | ICD-10-CM

## 2019-10-22 DIAGNOSIS — Z79899 Other long term (current) drug therapy: Secondary | ICD-10-CM | POA: Insufficient documentation

## 2019-10-22 DIAGNOSIS — I10 Essential (primary) hypertension: Secondary | ICD-10-CM | POA: Diagnosis not present

## 2019-10-22 DIAGNOSIS — R29818 Other symptoms and signs involving the nervous system: Secondary | ICD-10-CM | POA: Insufficient documentation

## 2019-10-22 DIAGNOSIS — M5412 Radiculopathy, cervical region: Secondary | ICD-10-CM

## 2019-10-22 LAB — COMPREHENSIVE METABOLIC PANEL
ALT: 29 U/L (ref 0–44)
AST: 26 U/L (ref 15–41)
Albumin: 3.8 g/dL (ref 3.5–5.0)
Alkaline Phosphatase: 51 U/L (ref 38–126)
Anion gap: 12 (ref 5–15)
BUN: 13 mg/dL (ref 6–20)
CO2: 23 mmol/L (ref 22–32)
Calcium: 9 mg/dL (ref 8.9–10.3)
Chloride: 102 mmol/L (ref 98–111)
Creatinine, Ser: 0.65 mg/dL (ref 0.44–1.00)
GFR calc Af Amer: >60 mL/min (ref >60)
GFR calc non Af Amer: >60 mL/min (ref >60)
Glucose, Bld: 100 mg/dL — ABNORMAL HIGH (ref 70–99)
Potassium: 3.5 mmol/L (ref 3.5–5.1)
Sodium: 137 mmol/L (ref 135–145)
Total Bilirubin: 0.5 mg/dL (ref 0.3–1.2)
Total Protein: 8.6 g/dL — ABNORMAL HIGH (ref 6.5–8.1)

## 2019-10-22 LAB — CBC WITH DIFFERENTIAL/PLATELET
Abs Immature Granulocytes: 0.07 10*3/uL (ref 0.00–0.07)
Basophils Absolute: 0 10*3/uL (ref 0.0–0.1)
Basophils Relative: 1 %
Eosinophils Absolute: 0 10*3/uL (ref 0.0–0.5)
Eosinophils Relative: 1 %
HCT: 40.1 % (ref 36.0–46.0)
Hemoglobin: 12.4 g/dL (ref 12.0–15.0)
Immature Granulocytes: 1 %
Lymphocytes Relative: 19 %
Lymphs Abs: 0.9 10*3/uL (ref 0.7–4.0)
MCH: 28.8 pg (ref 26.0–34.0)
MCHC: 30.9 g/dL (ref 30.0–36.0)
MCV: 93.3 fL (ref 80.0–100.0)
Monocytes Absolute: 0.5 10*3/uL (ref 0.1–1.0)
Monocytes Relative: 9 %
Neutro Abs: 3.4 10*3/uL (ref 1.7–7.7)
Neutrophils Relative %: 69 %
Platelets: 257 10*3/uL (ref 150–400)
RBC: 4.3 MIL/uL (ref 3.87–5.11)
RDW: 12.7 % (ref 11.5–15.5)
WBC: 4.9 10*3/uL (ref 4.0–10.5)
nRBC: 0 % (ref 0.0–0.2)

## 2019-10-22 LAB — I-STAT CHEM 8, ED
BUN: 13 mg/dL (ref 6–20)
Calcium, Ion: 1.15 mmol/L (ref 1.15–1.40)
Chloride: 100 mmol/L (ref 98–111)
Creatinine, Ser: 0.7 mg/dL (ref 0.44–1.00)
Glucose, Bld: 103 mg/dL — ABNORMAL HIGH (ref 70–99)
HCT: 38 % (ref 36.0–46.0)
Hemoglobin: 12.9 g/dL (ref 12.0–15.0)
Potassium: 3.5 mmol/L (ref 3.5–5.1)
Sodium: 140 mmol/L (ref 135–145)
TCO2: 29 mmol/L (ref 22–32)

## 2019-10-22 LAB — TROPONIN I (HIGH SENSITIVITY)
Troponin I (High Sensitivity): <2 ng/L (ref <18)
Troponin I (High Sensitivity): 3 ng/L (ref <18)

## 2019-10-22 MED ORDER — LORAZEPAM 2 MG/ML IJ SOLN
1.0000 mg | Freq: Once | INTRAMUSCULAR | Status: AC
  Administered 2019-10-22: 1 mg via INTRAVENOUS
  Filled 2019-10-22: qty 1

## 2019-10-22 MED ORDER — PREDNISONE 20 MG PO TABS
ORAL_TABLET | ORAL | 0 refills | Status: DC
Start: 2019-10-22 — End: 2020-01-01

## 2019-10-22 MED ORDER — CYCLOBENZAPRINE HCL 5 MG PO TABS
5.0000 mg | ORAL_TABLET | Freq: Three times a day (TID) | ORAL | 0 refills | Status: DC | PRN
Start: 2019-10-22 — End: 2020-01-01

## 2019-10-22 NOTE — ED Provider Notes (Signed)
[facility] DEPT Provider Note   CSN: [id number] Arrival date & time: 10/22/19  1752     History Chief Complaint  Patient presents with  . Hypertension  . Extremity Weakness    [name] is a 54 y.o. female history of fibroids, previous PE off anticoagulation, here presenting with left arm weakness.  Patient states that for the last week or so, she has been having left arm weakness.  She states that she is unable to hold her arm up.  She has some muscle spasm in the left upper back.  Denies any chest pain or shortness of breath.  Denies any trouble speaking or leg weakness or arm numbness.  Patient denies any trauma or injury to the shoulder.  The history is provided by the patient.       Past Medical History:  Diagnosis Date  . Abnormal Pap smear 7/13   ASCUS/positive HR HPV, negative colpo  . Anemia 2006  . Cancer ([place])    Breast- left  . 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 Family history of cancer   . Fibroids    with enlarged uterus  . GERD (gastroesophageal reflux disease)   . Hypertension   . Pulmonary embolism ([place]) 2008   secondary to OCP  . 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 Genetic testing 03/16/2019  . Family history of cancer   . Malignant neoplasm of upper-inner quadrant of left breast in female, estrogen receptor positive ([place]) 02/21/2019  . Uterine leiomyoma 09/29/2015  . Fibroids 10/28/2014    Class: History of  . Female infertility 03/19/2014  . Numbness of face 01/04/2014  . Other malaise and fatigue 01/04/2014  . Disturbance of skin sensation 01/04/2014  . Dizziness and giddiness 08/28/2013  . Condyloma 05/31/2013  . Anemia, iron deficiency 12/25/2012  . HYPERTENSION 01/30/2009  . CYSTIC ACNE 01/30/2009  . HYPERSOMNIA 01/30/2009  . PALPITATIONS, HX OF 01/30/2009    Past Surgical History:  Procedure Laterality Date  .  BREAST LUMPECTOMY WITH RADIOACTIVE SEED LOCALIZATION Left 03/20/2019   Procedure: LEFT BREAST LUMPECTOMY WITH RADIOACTIVE SEED LOCALIZATION;  Surgeon: Coralie Keens, MD;  Location: [place];  Service: General;  Laterality: Left;  . ESOPHAGOGASTRODUODENOSCOPY  2020  . IR GENERIC HISTORICAL  03/31/2016   IR RADIOLOGIST EVAL & MGMT 03/31/2016 Marybelle Killings, MD [facility] INTERV RAD  . IR GENERIC HISTORICAL  01/06/2016   IR RADIOLOGIST EVAL & MGMT 01/06/2016 Marybelle Killings, MD [facility] INTERV RAD  . left finger reattachment  age 74  . LEFT OOPHORECTOMY Left 1998      . MYOMECTOMY  2003  . MYOMECTOMY  2008   via laparoscopy  . RE-EXCISION OF BREAST CANCER,SUPERIOR MARGINS Left 04/06/2019   Procedure: RE-EXCISION OF LEFT BREAST CANCER MARGINS;  Surgeon: Coralie Keens, MD;  Location: [place];  Service: General;  Laterality: Left;     OB History    Gravida  3   Para  0   Term      Preterm      AB  3   Living  0     SAB      TAB      Ectopic      Multiple      Live Births              Family History  Problem Relation Age of Onset  . Diabetes Mother   .  Hypertension Mother   . Heart disease Mother        cardiomegaly  . Lupus Mother   . Diabetes Father   . Hypertension Father   . Stroke Father   . Hypertension Maternal Grandmother   . Stroke Maternal Grandmother   . Hypertension Sister   . Seizures Sister   . COPD Sister   . Ovarian cancer Paternal Aunt     Social History   Tobacco Use  . Smoking status: Never Smoker  . Smokeless tobacco: Never Used  Substance Use Topics  . Alcohol use: Yes    Alcohol/week: 0.0 - 1.0 standard drinks    Comment: occasional wine  . Drug use: No    Home Medications Prior to Admission medications   Medication Sig Start Date End Date Taking? Authorizing Provider  ALPRAZolam (XANAX) 0.25 MG tablet Take 0.25 mg by mouth 2 (two) times daily as needed for anxiety.  01/01/19   [provider]  amLODipine-valsartan (EXFORGE)  10-320 MG tablet Take 1 tablet by mouth daily.  05/20/16   [provider]  Ascorbic Acid (VITAMIN C) 1000 MG tablet Take 1,000 mg by mouth daily.     [provider]  Cholecalciferol (VITAMIN D3) 125 MCG (5000 UT) CAPS Take 5,000 Units by mouth daily.  06/18/16   [provider]  Dermatological Products, Misc. [facility]) lotion EpiCeram topical emulsion, extended release  APPLY TO LEFT CHEST THROUGHOUT THE DAY    [provider]  dexlansoprazole (DEXILANT) 60 MG capsule Take 60 mg by mouth every morning.     [provider]  ferrous sulfate 325 (65 FE) MG tablet Take 325 mg by mouth daily with breakfast.  12/02/16   [provider]  folic acid (FOLVITE) 1 MG tablet Take 1 tablet (1 mg total) by mouth daily. 08/07/19   [name], MD  hydrochlorothiazide (HYDRODIURIL) 25 MG tablet Take 25 mg by mouth daily.  06/18/16   [provider]  ibuprofen (ADVIL) 600 MG tablet Take 600 mg by mouth every 6 (six) hours as needed. 04/20/19   [provider]  ketoconazole (NIZORAL) 2 % cream Apply 1 application topically daily as needed for irritation.     [provider]  Probiotic Product (PROBIOTIC-10) CAPS Take 1 capsule by mouth daily.     [provider]  SPIRULINA PO Take 1 capsule by mouth daily.     [provider]  tazarotene (TAZORAC) 0.05 % cream Apply 1 application topically at bedtime as needed (acne).     [provider]  Toremifene Citrate (FARESTON) 60 MG tablet Take 1 tablet (60 mg total) by mouth daily. 07/09/19   [name], MD    Allergies    Minocycline hcl and Sulfa antibiotics  Review of Systems   Review of Systems  Musculoskeletal: Positive for extremity weakness.  Neurological: Positive for weakness.  All other systems reviewed and are negative.   Physical Exam Updated Vital Signs BP (!) 144/77   Pulse 78   Temp 98.2 F (36.8 C) (Oral)   Resp 18   Ht 5\' 5"  (1.651  m)   Wt 86.2 kg   LMP 10/15/2019 (Approximate)   SpO2 100%   BMI 31.62 kg/m   Physical Exam Vitals and nursing note reviewed.  HENT:     Head: Normocephalic.     Nose: Nose normal.     Mouth/Throat:     Mouth: Mucous membranes are moist.  Eyes:  Extraocular Movements: Extraocular movements intact.     Pupils: Pupils are equal, round, and reactive to light.  Neck:     Comments: Mild left para cervical tenderness. Cardiovascular:     Rate and Rhythm: Normal rate.     Pulses: Normal pulses.  Pulmonary:     Effort: Pulmonary effort is normal.     Breath sounds: Normal breath sounds.  Abdominal:     General: Abdomen is flat.     Palpations: Abdomen is soft.  Musculoskeletal:     Comments: Unable to abduct the left deltoid.  Patient able to adduct.  Normal left triceps extension and biceps flexion.  Normal hand grasp and wrist extension and flexion on the left.  2+ radial pulses on the left.  Normal strength in the left lower extremity and entire right side.  Skin:    General: Skin is warm.     Capillary Refill: Capillary refill takes less than 2 seconds.  Neurological:     Mental Status: She is alert.     Comments: See MSK, no facial droop   Psychiatric:        Mood and Affect: Mood normal.        Behavior: Behavior normal.     ED Results / Procedures / Treatments   Labs (all labs ordered are listed, but only abnormal results are displayed) Labs Reviewed  COMPREHENSIVE METABOLIC PANEL - Abnormal; Notable for the following components:      Result Value   Glucose, Bld 100 (*)    Total Protein 8.6 (*)    All other components within normal limits  I-STAT CHEM 8, ED - Abnormal; Notable for the following components:   Glucose, Bld 103 (*)    All other components within normal limits  CBC WITH DIFFERENTIAL/PLATELET  TROPONIN I (HIGH SENSITIVITY)  TROPONIN I (HIGH SENSITIVITY)    EKG None  Radiology No results found.  Procedures Procedures (including critical  care time)  Medications Ordered in ED Medications  LORazepam (ATIVAN) injection 1 mg (1 mg Intravenous Given 10/22/19 1938)    ED Course  I have reviewed the triage vital signs and the nursing notes.  Pertinent labs & imaging results that were available during my care of the patient were reviewed by me and considered in my medical decision making (see chart for details).    MDM Rules/Calculators/A&P                      Baley Shands is a 54 y.o. female here presenting with left arm weakness.  I think likely cervical radiculopathy or brachial plexus syndrome or rotator cuff injury.  Patient has no trauma or injury to the arm.  Patient does have a family history of stroke and was concerned for stroke.  Will get MRI of the brain and cervical spine.  With both MRIs were unremarkable, consider referral to Ortho for further work-up for pericholecystic plexus syndrome versus rotator cuff injury.  9:30 PM Labs unremarkable. Trop neg x 2.  MRI showed no stroke.  She does have some cervical disc degeneration but no obvious cord syncopal abnormality.  She does have some cervical stenosis as well.  I wonder if she has brachial plexus versus rotator cuff injury.  We will give her some steroids and Flexeril.  Will refer her to Ortho for follow-up  Final Clinical Impression(s) / ED Diagnoses Final diagnoses:  None    Rx / DC Orders ED Discharge Orders    None  Drenda Freeze, MD 10/22/19 2131

## 2019-10-22 NOTE — ED Triage Notes (Signed)
Patient c/o hypertension. BP-161/93 in triage. Patient c/o left arm weakness. Patient states this started a week ago. patient is unable to hold her left arm up

## 2019-10-22 NOTE — Discharge Instructions (Addendum)
Take prednisone as prescribed   Take Flexeril if you have some muscle spasms.  You likely have a pinched nerve in your brachial plexus or a ligament problem in your shoulder.  Your MRI today did not show any stroke.  You do have some pinched nerves at your neck and the prednisone can help with that.  Follow with an orthopedic doctor.  Return to ER if you have worse weakness, numbness.

## 2019-10-31 ENCOUNTER — Ambulatory Visit (INDEPENDENT_AMBULATORY_CARE_PROVIDER_SITE_OTHER): Payer: BC Managed Care – PPO

## 2019-10-31 ENCOUNTER — Ambulatory Visit (INDEPENDENT_AMBULATORY_CARE_PROVIDER_SITE_OTHER): Payer: BC Managed Care – PPO | Admitting: Orthopedic Surgery

## 2019-10-31 ENCOUNTER — Encounter: Payer: Self-pay | Admitting: Orthopedic Surgery

## 2019-10-31 DIAGNOSIS — M25512 Pain in left shoulder: Secondary | ICD-10-CM

## 2019-10-31 NOTE — Progress Notes (Signed)
Office Visit Note   Patient: [name]           Date of Birth: Jul 30, 1965           MRN: [id number] Visit Date: 10/31/2019 Requested by: Elwyn Reach, [place],  [place] [id number] PCP: Elwyn Reach, MD  Subjective: Chief Complaint  Patient presents with  . Left Shoulder - Pain    HPI: Is a patient with left shoulder pain and weakness.  Been going on for several weeks but it is gotten worse recently.  She reports radiating pain to the biceps along with some numbness in the deltoid distribution.  She has an MRI scan and the report of which is reviewed.  Does show worsening of C4-5 left paracentral disc.  The pain does not wake her from sleep at night.  Denies any symptoms below the elbow.  Denies any history of injury to the left shoulder.  She has to assist raising her arm up over her head.  She tried muscle relaxer and Medrol Dosepak from the emergency department without much relief.  She does sit down pharmaceutical type work.              ROS: All systems reviewed are negative as they relate to the chief complaint within the history of present illness.  Patient denies  fevers or chills.   Assessment & Plan: Visit Diagnoses:  1. Left shoulder pain, unspecified chronicity     Plan: Impression is cervical radiculopathy with deltoid biceps and external rotation weakness with normal shoulder radiographs.  No history of injury.  Patient does have radiculopathy with weakness.  This does not look like a shoulder problem.  This is more of a neck problem.  Refer to Dr. Lorin Mercy for surgical evaluation.  Not sure if cervical spine injection indicated at this time with the amount of weakness present.  Follow-Up Instructions: No follow-ups on file.   Orders:  Orders Placed This Encounter  Procedures  . XR Shoulder Left   No orders of the defined types were placed in this encounter.     Procedures: No procedures performed   Clinical Data: No  additional findings.  Objective: Vital Signs: LMP 10/15/2019 (Approximate)   Physical Exam:   Constitutional: Patient appears well-developed HEENT:  Head: Normocephalic Eyes:EOM are normal Neck: Normal range of motion Cardiovascular: Normal rate Pulmonary/chest: Effort normal Neurologic: Patient is alert Skin: Skin is warm Psychiatric: Patient has normal mood and affect    Ortho Exam: Ortho exam demonstrates full active and passive range of motion of the right arm.  The left arm has forward flexion and abduction both below 90 degrees.  Passively the patient can easily lift her arm up over her shoulder head and into full flexion and abduction.  She has 5 out of 5 grip EPL FPL interosseous wrist flexion wrist extension triceps strength.  Biceps is 4+ out of 5.  Deltoid is 4+ out of 5 on the left.  External rotation is 4- out of 5 on the left 5+ out of 5 on the right.  There are paresthesias deltoid distribution on the left-hand side compared to the right.  Cervical spine range of motion is full.  Mild trapezial tenderness present on the left.  Specialty Comments:  No specialty comments available.  Imaging: XR Shoulder Left  Result Date: 10/31/2019 AP outlet axillary lateral radiographs left shoulder reviewed.  Visualized lung fields clear.  No fracture or dislocation.  No glenohumeral joint or  AC joint arthritis.  Acromiohumeral distance normal.  Normal left shoulder    PMFS History: Patient Active Problem List   Diagnosis Date Noted  . Genetic testing 03/16/2019  . Family history of cancer   . Malignant neoplasm of upper-inner quadrant of left breast in female, estrogen receptor positive ([place]) 02/21/2019  . Uterine leiomyoma 09/29/2015  . Fibroids 10/28/2014    Class: History of  . Female infertility 03/19/2014  . Numbness of face 01/04/2014  . Other malaise and fatigue 01/04/2014  . Disturbance of skin sensation 01/04/2014  . Dizziness and giddiness 08/28/2013  .  Condyloma 05/31/2013  . Anemia, iron deficiency 12/25/2012  . HYPERTENSION 01/30/2009  . CYSTIC ACNE 01/30/2009  . HYPERSOMNIA 01/30/2009  . PALPITATIONS, HX OF 01/30/2009   Past Medical History:  Diagnosis Date  . Abnormal Pap smear 7/13   ASCUS/positive HR HPV, negative colpo  . Anemia 2006  . Cancer ([place])    Breast- left  . 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 Family history of cancer   . Fibroids    with enlarged uterus  . GERD (gastroesophageal reflux disease)   . Hypertension   . Pulmonary embolism ([place]) 2008   secondary to OCP  . Varices of esophagus determined by endoscopy [facility])     Family History  Problem Relation Age of Onset  . Diabetes Mother   . Hypertension Mother   . Heart disease Mother        cardiomegaly  . Lupus Mother   . Diabetes Father   . Hypertension Father   . Stroke Father   . Hypertension Maternal Grandmother   . Stroke Maternal Grandmother   . Hypertension Sister   . Seizures Sister   . COPD Sister   . Ovarian cancer Paternal Aunt     Past Surgical History:  Procedure Laterality Date  . BREAST LUMPECTOMY WITH RADIOACTIVE SEED LOCALIZATION Left 03/20/2019   Procedure: LEFT BREAST LUMPECTOMY WITH RADIOACTIVE SEED LOCALIZATION;  Surgeon: Coralie Keens, MD;  Location: [place];  Service: General;  Laterality: Left;  . ESOPHAGOGASTRODUODENOSCOPY  2020  . IR GENERIC HISTORICAL  03/31/2016   IR RADIOLOGIST EVAL & MGMT 03/31/2016 Marybelle Killings, MD [facility] INTERV RAD  . IR GENERIC HISTORICAL  01/06/2016   IR RADIOLOGIST EVAL & MGMT 01/06/2016 Marybelle Killings, MD [facility] INTERV RAD  . left finger reattachment  age 10  . LEFT OOPHORECTOMY Left 1998      . MYOMECTOMY  2003  . MYOMECTOMY  2008   via laparoscopy  . RE-EXCISION OF BREAST CANCER,SUPERIOR MARGINS Left 04/06/2019   Procedure: RE-EXCISION OF LEFT BREAST CANCER MARGINS;  Surgeon: Coralie Keens, MD;  Location: [place];  Service: General;  Laterality: Left;   Social History   Occupational  History  . Occupation: Insurance account manager: [employer]  Tobacco Use  . Smoking status: Never Smoker  . Smokeless tobacco: Never Used  Vaping Use  . Vaping Use: Never used  Substance and Sexual Activity  . Alcohol use: Yes    Alcohol/week: 0.0 - 1.0 standard drinks    Comment: occasional wine  . Drug use: No  . Sexual activity: Yes    Partners: Male    Birth control/protection: None

## 2019-11-05 ENCOUNTER — Inpatient Hospital Stay: Payer: BC Managed Care – PPO | Attending: Genetic Counselor

## 2019-11-05 ENCOUNTER — Inpatient Hospital Stay (HOSPITAL_BASED_OUTPATIENT_CLINIC_OR_DEPARTMENT_OTHER): Payer: BC Managed Care – PPO | Admitting: Hematology & Oncology

## 2019-11-05 ENCOUNTER — Other Ambulatory Visit: Payer: Self-pay

## 2019-11-05 ENCOUNTER — Encounter: Payer: Self-pay | Admitting: Hematology & Oncology

## 2019-11-05 VITALS — BP 142/79 | HR 75 | Temp 96.9°F | Resp 18 | Wt 194.0 lb

## 2019-11-05 DIAGNOSIS — R5383 Other fatigue: Secondary | ICD-10-CM | POA: Insufficient documentation

## 2019-11-05 DIAGNOSIS — Z923 Personal history of irradiation: Secondary | ICD-10-CM | POA: Insufficient documentation

## 2019-11-05 DIAGNOSIS — D0512 Intraductal carcinoma in situ of left breast: Secondary | ICD-10-CM | POA: Insufficient documentation

## 2019-11-05 DIAGNOSIS — Z79810 Long term (current) use of selective estrogen receptor modulators (SERMs): Secondary | ICD-10-CM | POA: Insufficient documentation

## 2019-11-05 DIAGNOSIS — Z170 Estrogen receptor positive status [ER+]: Secondary | ICD-10-CM | POA: Diagnosis not present

## 2019-11-05 DIAGNOSIS — D500 Iron deficiency anemia secondary to blood loss (chronic): Secondary | ICD-10-CM | POA: Diagnosis not present

## 2019-11-05 DIAGNOSIS — R200 Anesthesia of skin: Secondary | ICD-10-CM

## 2019-11-05 DIAGNOSIS — Z7952 Long term (current) use of systemic steroids: Secondary | ICD-10-CM | POA: Diagnosis not present

## 2019-11-05 DIAGNOSIS — C50212 Malignant neoplasm of upper-inner quadrant of left female breast: Secondary | ICD-10-CM

## 2019-11-05 DIAGNOSIS — Z791 Long term (current) use of non-steroidal anti-inflammatories (NSAID): Secondary | ICD-10-CM | POA: Diagnosis not present

## 2019-11-05 DIAGNOSIS — N921 Excessive and frequent menstruation with irregular cycle: Secondary | ICD-10-CM | POA: Diagnosis not present

## 2019-11-05 DIAGNOSIS — Z79899 Other long term (current) drug therapy: Secondary | ICD-10-CM | POA: Insufficient documentation

## 2019-11-05 LAB — CBC WITH DIFFERENTIAL (CANCER CENTER ONLY)
Abs Immature Granulocytes: 0.14 10*3/uL — ABNORMAL HIGH (ref 0.00–0.07)
Basophils Absolute: 0 10*3/uL (ref 0.0–0.1)
Basophils Relative: 1 %
Eosinophils Absolute: 0.1 10*3/uL (ref 0.0–0.5)
Eosinophils Relative: 2 %
HCT: 38.5 % (ref 36.0–46.0)
Hemoglobin: 12.2 g/dL (ref 12.0–15.0)
Immature Granulocytes: 3 %
Lymphocytes Relative: 22 %
Lymphs Abs: 1.2 10*3/uL (ref 0.7–4.0)
MCH: 29.4 pg (ref 26.0–34.0)
MCHC: 31.7 g/dL (ref 30.0–36.0)
MCV: 92.8 fL (ref 80.0–100.0)
Monocytes Absolute: 0.6 10*3/uL (ref 0.1–1.0)
Monocytes Relative: 10 %
Neutro Abs: 3.4 10*3/uL (ref 1.7–7.7)
Neutrophils Relative %: 62 %
Platelet Count: 231 10*3/uL (ref 150–400)
RBC: 4.15 MIL/uL (ref 3.87–5.11)
RDW: 12.8 % (ref 11.5–15.5)
WBC Count: 5.5 10*3/uL (ref 4.0–10.5)
nRBC: 0 % (ref 0.0–0.2)

## 2019-11-05 LAB — CMP (CANCER CENTER ONLY)
ALT: 21 U/L (ref 0–44)
AST: 17 U/L (ref 15–41)
Albumin: 3.7 g/dL (ref 3.5–5.0)
Alkaline Phosphatase: 49 U/L (ref 38–126)
Anion gap: 7 (ref 5–15)
BUN: 11 mg/dL (ref 6–20)
CO2: 31 mmol/L (ref 22–32)
Calcium: 9.7 mg/dL (ref 8.9–10.3)
Chloride: 102 mmol/L (ref 98–111)
Creatinine: 0.8 mg/dL (ref 0.44–1.00)
GFR, Est AFR Am: >60 mL/min (ref >60)
GFR, Estimated: >60 mL/min (ref >60)
Glucose, Bld: 109 mg/dL — ABNORMAL HIGH (ref 70–99)
Potassium: 3.9 mmol/L (ref 3.5–5.1)
Sodium: 140 mmol/L (ref 135–145)
Total Bilirubin: 0.5 mg/dL (ref 0.3–1.2)
Total Protein: 8.1 g/dL (ref 6.5–8.1)

## 2019-11-05 NOTE — Progress Notes (Signed)
Hematology and Oncology Follow Up Visit  [name] [id number] 1965/06/19 54 y.o. 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 daily-start on 07/08/2018  Radiation therapy -- completed 5200 rad - on 06/20/2019     Interim History:  [name] is back for follow-up.  So far, everything is going okay with respect to the breast cancer.  The real problem that she has is her left shoulder.  She has some weakness in the left shoulder.  She did have an MRI done a week or so ago.  It does look like there might be a disc herniation at C4-5 to the left.  She will see the orthopedist tomorrow.  Hopefully, she will not need surgery.  She is trying to avoid surgery.  She says her monthly cycles every 3 months now.  We are going to check her hormonal status.  Once we know she is postmenopausal, we will switch her over to an aromatase inhibitor.  She does feel tired.  I would think that her iron levels should be okay.  She has had no change in bowel or bladder habits.  There has been no rashes.  She has had no leg swelling.  She has had no fever.  She is planning on going to [facility] to see a relative soon.  Then, in September, she and her husband are going down to [place].  Overall, I would say her performance status is ECOG 1.    Medications:  Current Outpatient Medications:  .  ALPRAZolam (XANAX) 0.25 MG tablet, Take 0.25 mg by mouth 2 (two) times daily as needed for anxiety. , Disp: , Rfl:  .  amLODipine-valsartan (EXFORGE) 10-320 MG tablet, Take 1 tablet by mouth daily. , Disp: , Rfl:  .  Ascorbic Acid (VITAMIN C) 1000 MG tablet, Take 1,000 mg by mouth daily. , Disp: , Rfl:  .  Cholecalciferol (VITAMIN D3) 125 MCG (5000 UT) CAPS, Take 5,000 Units by mouth daily. , Disp: , Rfl:  .  cyclobenzaprine  (FLEXERIL) 5 MG tablet, Take 1 tablet (5 mg total) by mouth 3 (three) times daily as needed for muscle spasms., Disp: 10 tablet, Rfl: 0 .  Dermatological Products, Misc. [facility]) lotion, EpiCeram topical emulsion, extended release  APPLY TO LEFT CHEST THROUGHOUT THE DAY, Disp: , Rfl:  .  dexlansoprazole (DEXILANT) 60 MG capsule, Take 60 mg by mouth every morning. , Disp: , Rfl:  .  ferrous sulfate 325 (65 FE) MG tablet, Take 325 mg by mouth daily with breakfast. , Disp: , Rfl:  .  folic acid (FOLVITE) 1 MG tablet, Take 1 tablet (1 mg total) by mouth daily., Disp: 90 tablet, Rfl: 3 .  hydrochlorothiazide (HYDRODIURIL) 25 MG tablet, Take 25 mg by mouth daily. , Disp: , Rfl:  .  ibuprofen (ADVIL) 600 MG tablet, Take 600 mg by mouth every 6 (six) hours as needed., Disp: , Rfl:  .  ketoconazole (NIZORAL) 2 % cream, Apply 1 application topically daily as needed for irritation. , Disp: , Rfl:  .  LINZESS 145 MCG CAPS capsule, Take 145 mcg by mouth as needed., Disp: , Rfl:  .  predniSONE (DELTASONE) 20 MG tablet, Take 60 mg daily x 2 days then 40 mg daily x 2 days then 20 mg daily x 2 days, Disp:  12 tablet, Rfl: 0 .  Probiotic Product (PROBIOTIC-10) CAPS, Take 1 capsule by mouth daily. , Disp: , Rfl:  .  SPIRULINA PO, Take 1 capsule by mouth daily. , Disp: , Rfl:  .  tazarotene (TAZORAC) 0.05 % cream, Apply 1 application topically at bedtime as needed (acne). , Disp: , Rfl:  .  Toremifene Citrate (FARESTON) 60 MG tablet, Take 1 tablet (60 mg total) by mouth daily., Disp: 90 tablet, Rfl: 3  Allergies:  Allergies  Allergen Reactions  . Minocycline Hcl Hives and Itching  . Sulfa Antibiotics Swelling    Swell of the lips     Past Medical History, Surgical history, Social history, and Family History were reviewed and updated.  Review of Systems: Review of Systems  Constitutional: Negative.   HENT:  Negative.   Eyes: Negative.   Respiratory: Negative.   Cardiovascular: Negative.     Gastrointestinal: Negative.   Endocrine: Negative.   Genitourinary: Negative.    Musculoskeletal: Negative.   Skin: Negative.   Neurological: Negative.   Hematological: Negative.   Psychiatric/Behavioral: Negative.     Physical Exam:  weight is 194 lb (88 kg). Her temporal temperature is 96.9 F (36.1 C) (abnormal). Her blood pressure is 142/79 (abnormal) and her pulse is 75. Her respiration is 18 and oxygen saturation is 99%.   Wt Readings from Last 3 Encounters:  11/05/19 194 lb (88 kg)  10/22/19 190 lb (86.2 kg)  08/07/19 198 lb 1.9 oz (89.9 kg)    Physical Exam Vitals reviewed.  Constitutional:      Comments: On her breast exam, right breast was without masses, edema or erythema.  There is no right axillary adenopathy.  Left breast is quite hyperpigmented from radiation.  There is some healing areas of skin from the radiation.  There is some contraction from radiation and  surgery.  She has the lumpectomy scar on the areola at about the 11 o'clock position.  There is no erythema.  There is no fullness.  There is no distinct mass in the left breast.  There is no left axillary adenopathy.  HENT:     Head: Normocephalic and atraumatic.  Eyes:     Pupils: Pupils are equal, round, and reactive to light.  Cardiovascular:     Rate and Rhythm: Normal rate and regular rhythm.     Heart sounds: Normal heart sounds.  Pulmonary:     Effort: Pulmonary effort is normal.     Breath sounds: Normal breath sounds.  Abdominal:     General: Bowel sounds are normal.     Palpations: Abdomen is soft.  Musculoskeletal:        General: No tenderness or deformity. Normal range of motion.     Cervical back: Normal range of motion.  Lymphadenopathy:     Cervical: No cervical adenopathy.  Skin:    General: Skin is warm and dry.     Findings: No erythema or rash.  Neurological:     Mental Status: She is alert and oriented to person, place, and time.  Psychiatric:        Behavior: Behavior  normal.        Thought Content: Thought content normal.        Judgment: Judgment normal.      Lab Results  Component Value Date   WBC 5.5 11/05/2019   HGB 12.2 11/05/2019   HCT 38.5 11/05/2019   MCV 92.8 11/05/2019   PLT 231 11/05/2019     Chemistry  Component Value Date/Time   NA 140 10/22/2019 1844   NA 142 04/12/2017 0858   K 3.5 10/22/2019 1844   K 3.8 04/12/2017 0858   CL 100 10/22/2019 1844   CL 104 04/12/2017 0858   CO2 23 10/22/2019 1832   CO2 26 04/12/2017 0858   BUN 13 10/22/2019 1844   BUN 9 04/12/2017 0858   CREATININE 0.70 10/22/2019 1844   CREATININE 0.74 08/07/2019 0808   CREATININE 0.8 04/12/2017 0858      Component Value Date/Time   CALCIUM 9.0 10/22/2019 1832   CALCIUM 9.2 04/12/2017 0858   ALKPHOS 51 10/22/2019 1832   ALKPHOS 74 04/12/2017 0858   AST 26 10/22/2019 1832   AST 12 (L) 08/07/2019 0808   ALT 29 10/22/2019 1832   ALT 17 08/07/2019 0808   ALT 26 04/12/2017 0858   BILITOT 0.5 10/22/2019 1832   BILITOT 0.4 08/07/2019 0808      Impression and Plan: [name] is a 54 year old African-American female.  She now has ductal carcinoma in situ of the left breast with microinvasion.  Again, I really do not think this is going to change her protocol.  I did not get Oncotype on her.  She completed her radiation therapy.  She is on Fareston.  Again, I think [name] is very appropriate for her given that she has had this history of thromboembolic disease and Fareston has less of a risk of thromboembolic development then tamoxifen.  Hopefully, she will not need surgery for the neck issue.  We will have her come back in 3 months.  We will check her for her menopausal status at that time.  I told her to make sure she drinks a lot of water when she goes on her trips.       [name], MD 6/21/20218:22 AM

## 2019-11-06 ENCOUNTER — Ambulatory Visit (INDEPENDENT_AMBULATORY_CARE_PROVIDER_SITE_OTHER): Payer: BC Managed Care – PPO | Admitting: Orthopaedic Surgery

## 2019-11-06 ENCOUNTER — Ambulatory Visit: Payer: Self-pay

## 2019-11-06 ENCOUNTER — Encounter: Payer: Self-pay | Admitting: Orthopaedic Surgery

## 2019-11-06 VITALS — BP 111/70 | HR 77 | Ht 65.0 in | Wt 194.0 lb

## 2019-11-06 DIAGNOSIS — M542 Cervicalgia: Secondary | ICD-10-CM

## 2019-11-06 DIAGNOSIS — M5020 Other cervical disc displacement, unspecified cervical region: Secondary | ICD-10-CM

## 2019-11-06 LAB — FOLLICLE STIMULATING HORMONE: FSH: 17 m[IU]/mL

## 2019-11-06 LAB — LUTEINIZING HORMONE: LH: 18.6 m[IU]/mL

## 2019-11-06 NOTE — Progress Notes (Signed)
Office Visit Note   Patient: [name]           Date of Birth: 12-07-1965           MRN: [id number] Visit Date: 11/06/2019              Requested by: Elwyn Reach, MD [address] [place],  [place] [address] PCP: Elwyn Reach, MD   Assessment & Plan: Visit Diagnoses:  1. Neck pain   2. Protrusion of cervical intervertebral disc     Plan: Today we went over her scan.  Patient has left paracentral protrusion with left hemicord mass-effect and radiculopathy left side only.  We discussed operative intervention with a two-level cervical fusion as treatment option.  [name] has gotten better in the last week maybe this is related to the [place].  We discussed the approach overnight stay use of the postop collar.  MRI scan report given that she can review.  I will check her back again in 3 weeks.  Patient has some spinal stenosis due to disc protrusion with mass-effect on the cord and I discussed with her I would favor surgical intervention versus epidural injection since she does not have much room in the canal.  She can review her options and call if she has further questions and I plan to recheck her in 3 weeks.  We reviewed symptoms of increased cord compression and if these occur she will contact us promptly.  MRI images and report were reviewed with patient in detail.  Follow-Up Instructions: Return in about 3 weeks (around 11/27/2019).   Orders:  Orders Placed This Encounter  Procedures  . XR Cervical Spine 2 or 3 views   No orders of the defined types were placed in this encounter.     Procedures: No procedures performed   Clinical Data: No additional findings.   Subjective: Chief Complaint  Patient presents with  . Neck - Pain  . Left Arm - Pain    HPI 54 year old female with 3 weeks history of left arm pain and left shoulder abduction weakness.  Originally she could abduct only to about 90 degrees.  In the last week she has been able  to get her arm overhead if she uses her opposite hand and then can hold it there with some improvement in strength.  She is taken Medrol Dosepak muscle relaxants been the emergency room and has had cervical spine MRI scan.  Patient does not recall any past history of problems with neck or arm pain numbness or weakness in the past.  We unpack showed that she had had a cervical MRI scan 2015 which showed disc degeneration C 4-5 and C5-6.  Scan on 10/22/2019 showed progression of left paracentral disc herniation at C4-5 with mass-effect on the cord but no definite cord abnormal signal.  There is moderate by foraminal narrowing at C5-6.  Patient denies any lower extremity weakness numbness no problems with stairs.  No bowel bladder symptoms.  No falling.  Previous breast cancer 2020, anemia hypertension in the past.  Review of Systems all other systems are negative as pertains HPI.   Objective: Vital Signs: BP 111/70   Pulse 77   Ht 5\' 5"  (1.651 m)   Wt 194 lb (88 kg)   LMP 10/15/2019 (Approximate)   BMI 32.28 kg/m   Physical Exam Constitutional:      Appearance: She is well-developed.  HENT:     Head: Normocephalic.  Right Ear: External ear normal.     Left Ear: External ear normal.  Eyes:     Pupils: Pupils are equal, round, and reactive to light.  Neck:     Thyroid: No thyromegaly.     Trachea: No tracheal deviation.  Cardiovascular:     Rate and Rhythm: Normal rate.  Pulmonary:     Effort: Pulmonary effort is normal.  Abdominal:     Palpations: Abdomen is soft.  Skin:    General: Skin is warm and dry.  Neurological:     Mental Status: She is alert and oriented to person, place, and time.  Psychiatric:        Behavior: Behavior normal.     Ortho Exam patient has 1+ of extremities biceps triceps brachial radialis.  Right shoulder shows normal left shoulder strength abduction internal/external rotation.  Patient has one half grade weakness of her left biceps deltoid is 1 grade  down 4/5.  External rotation internal rotation is to decrease left only normal on the right side.  Sensation hand is intact some decreased sensation laterally over the deltoid.  Specialty Comments:  No specialty comments available.  Imaging: CLINICAL DATA:  54 year old female with left arm weakness starting 1 week ago. Hypertensive in triage (193 systolic).  EXAM: MRI CERVICAL SPINE WITHOUT CONTRAST  TECHNIQUE: Multiplanar, multisequence MR imaging of the cervical spine was performed. No intravenous contrast was administered.  COMPARISON:  Brain MRI today reported separately. Outside [facility] cervical spine MRI 10/05/2013.  FINDINGS: Alignment: Relatively preserved cervical lordosis. No spondylolisthesis.  Vertebrae: No marrow edema or evidence of acute osseous abnormality. Visualized bone marrow signal is within normal limits.  Cord: No cervical spinal cord signal abnormality despite degenerative spinal stenosis detailed below.  Posterior Fossa, vertebral arteries, paraspinal tissues: Cervicomedullary junction is within normal limits. Brain findings are reported separately. Preserved major vascular flow voids in the neck, fairly codominant vertebral arteries. Negative visible neck soft tissues; scattered bilateral cervical lymph nodes appear stable since 2015 and physiologic. Negative visible right lung apex.  Disc levels:  C2-C3: Mild to moderate posterior element hypertrophy. No significant stenosis.  C3-C4: Minimal disc bulge and posterior element hypertrophy. No stenosis.  C4-C5: Chronic but increased and now more left paracentral disc protrusion with annular fissure (series 9, image 12). Underlying circumferential disc bulge and endplate spurring. Mild spinal stenosis and left hemi cord mass effect. Mild to moderate bilateral C5 foraminal stenosis is new since 2015.  C5-C6: New small central disc protrusion with probable annular fissure (series 9,  image 16) superimposed on chronic disc bulging and endplate spurring. Mild spinal stenosis. Mild if any cord mass effect. Borderline to mild right C6 foraminal stenosis.  C6-C7:  Mild disc bulge and endplate spurring.  No stenosis.  C7-T1: Mild endplate and facet spurring. Mild left C8 foraminal stenosis is new since 2015.  No visible upper thoracic spinal stenosis.  IMPRESSION: 1. No acute osseous abnormality in the cervical spine.  2. Progressed since 2015 cervical disc degeneration C4-C5 and C5-C6. At the former there is a left paracentral disc herniation with up to mild spinal cord mass effect but no cord signal abnormality. Mild spinal stenosis at both levels. Up to moderate bilateral C5 foraminal stenosis is new since a 2015 MRI.  3. Mild cervical neural foraminal stenosis elsewhere.   Electronically Signed   By: Genevie Ann M.D.   On: 10/22/2019 20:59   PMFS History: Patient Active Problem List   Diagnosis Date Noted  . Protrusion of  cervical intervertebral disc 11/07/2019  . Genetic testing 03/16/2019  . Family history of cancer   . Malignant neoplasm of upper-inner quadrant of left breast in female, estrogen receptor positive ([place]) 02/21/2019  . Uterine leiomyoma 09/29/2015  . Fibroids 10/28/2014    Class: History of  . Female infertility 03/19/2014  . Numbness of face 01/04/2014  . Other malaise and fatigue 01/04/2014  . Disturbance of skin sensation 01/04/2014  . Dizziness and giddiness 08/28/2013  . Condyloma 05/31/2013  . Anemia, iron deficiency 12/25/2012  . HYPERTENSION 01/30/2009  . CYSTIC ACNE 01/30/2009  . HYPERSOMNIA 01/30/2009  . PALPITATIONS, HX OF 01/30/2009   Past Medical History:  Diagnosis Date  . Abnormal Pap smear 7/13   ASCUS/positive HR HPV, negative colpo  . Anemia 2006  . Cancer ([place])    Breast- left  . 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 Family history of cancer   . Fibroids    with enlarged uterus  . GERD  (gastroesophageal reflux disease)   . Hypertension   . Pulmonary embolism ([place]) 2008   secondary to OCP  . Varices of esophagus determined by endoscopy [facility])     Family History  Problem Relation Age of Onset  . Diabetes Mother   . Hypertension Mother   . Heart disease Mother        cardiomegaly  . Lupus Mother   . Diabetes Father   . Hypertension Father   . Stroke Father   . Hypertension Maternal Grandmother   . Stroke Maternal Grandmother   . Hypertension Sister   . Seizures Sister   . COPD Sister   . Ovarian cancer Paternal Aunt     Past Surgical History:  Procedure Laterality Date  . BREAST LUMPECTOMY WITH RADIOACTIVE SEED LOCALIZATION Left 03/20/2019   Procedure: LEFT BREAST LUMPECTOMY WITH RADIOACTIVE SEED LOCALIZATION;  Surgeon: Coralie Keens, MD;  Location: [place];  Service: General;  Laterality: Left;  . ESOPHAGOGASTRODUODENOSCOPY  2020  . IR GENERIC HISTORICAL  03/31/2016   IR RADIOLOGIST EVAL & MGMT 03/31/2016 Marybelle Killings, MD [facility] INTERV RAD  . IR GENERIC HISTORICAL  01/06/2016   IR RADIOLOGIST EVAL & MGMT 01/06/2016 Marybelle Killings, MD [facility] INTERV RAD  . left finger reattachment  age 35  . LEFT OOPHORECTOMY Left 1998      . MYOMECTOMY  2003  . MYOMECTOMY  2008   via laparoscopy  . RE-EXCISION OF BREAST CANCER,SUPERIOR MARGINS Left 04/06/2019   Procedure: RE-EXCISION OF LEFT BREAST CANCER MARGINS;  Surgeon: Coralie Keens, MD;  Location: [place];  Service: General;  Laterality: Left;   Social History   Occupational History  . Occupation: Insurance account manager: [employer]  Tobacco Use  . Smoking status: Never Smoker  . Smokeless tobacco: Never Used  Vaping Use  . Vaping Use: Never used  Substance and Sexual Activity  . Alcohol use: Yes    Alcohol/week: 0.0 - 1.0 standard drinks    Comment: occasional wine  . Drug use: No  . Sexual activity: Yes    Partners: Male    Birth control/protection: None

## 2019-11-07 ENCOUNTER — Ambulatory Visit: Payer: BC Managed Care – PPO | Admitting: Hematology & Oncology

## 2019-11-07 ENCOUNTER — Other Ambulatory Visit: Payer: BC Managed Care – PPO

## 2019-11-07 DIAGNOSIS — M5020 Other cervical disc displacement, unspecified cervical region: Secondary | ICD-10-CM | POA: Insufficient documentation

## 2019-11-10 LAB — ESTRADIOL, ULTRA SENS: Estradiol, Sensitive: 194 pg/mL

## 2019-11-27 ENCOUNTER — Ambulatory Visit (INDEPENDENT_AMBULATORY_CARE_PROVIDER_SITE_OTHER): Payer: BC Managed Care – PPO | Admitting: Orthopaedic Surgery

## 2019-11-27 VITALS — BP 131/81 | HR 61 | Ht 65.0 in | Wt 194.0 lb

## 2019-11-27 DIAGNOSIS — M5020 Other cervical disc displacement, unspecified cervical region: Secondary | ICD-10-CM | POA: Diagnosis not present

## 2019-11-27 NOTE — Progress Notes (Signed)
Office Visit Note   Patient: [name]           Date of Birth: 1965-09-03           MRN: [id number] Visit Date: 11/27/2019              Requested by: Elwyn Reach, MD [address] [place],  [place] [address] PCP: Elwyn Reach, MD   Assessment & Plan: Visit Diagnoses:  1. Protrusion of cervical intervertebral disc         With left C5 radiculopathy  Plan: MRI scan is reviewed with her.  She is gotten some improvement in strength would like to hold off on consideration of surgery at this point since she is improving.  We will recheck her in 2 months.  If she develops significant increase in pain and weakness she will return sooner.  MRI scan report and images were reviewed with patient again today.  Follow-Up Instructions: Return in about 2 months (around 01/28/2020).   Orders:  No orders of the defined types were placed in this encounter.  No orders of the defined types were placed in this encounter.     Procedures: No procedures performed   Clinical Data: No additional findings.   Subjective: Chief Complaint  Patient presents with  . Neck - Follow-up    HPI 54 year old female returns for follow-up of C4-5 left paracentral disc protrusion with hemicord pressure and left C5 radiculopathy.  She states her pain is gotten better she is not sure if it was the prednisone that she had about a week and a half before first saw her or chiropractic treatments or rest but in any event she states her pain is decreased and she is noticed some improvement in her strength.  She is walking well no long track signs.  Review of Systems 14 point systems unchanged from 11/07/2019 office visit other than as mentioned in HPI.   Objective: Vital Signs: BP 131/81   Pulse 61   Ht 5\' 5"  (1.651 m)   Wt 194 lb (88 kg)   BMI 32.28 kg/m   Physical Exam Constitutional:      Appearance: She is well-developed.  HENT:     Head: Normocephalic.     Right Ear:  External ear normal.     Left Ear: External ear normal.  Eyes:     Pupils: Pupils are equal, round, and reactive to light.  Neck:     Thyroid: No thyromegaly.     Trachea: No tracheal deviation.  Cardiovascular:     Rate and Rhythm: Normal rate.  Pulmonary:     Effort: Pulmonary effort is normal.  Abdominal:     Palpations: Abdomen is soft.  Skin:    General: Skin is warm and dry.  Neurological:     Mental Status: She is alert and oriented to person, place, and time.  Psychiatric:        Behavior: Behavior normal.     Ortho Exam positive [place] positive brachial plexus tenderness on the left she still has one half grade weakness left deltoid strong on the right.  No biceps or triceps weakness right or left. Specialty Comments:  No specialty comments available.  Imaging: No results found.   PMFS History: Patient Active Problem List   Diagnosis Date Noted  . Protrusion of cervical intervertebral disc 11/07/2019  . Genetic testing 03/16/2019  . Family history of cancer   . Malignant neoplasm of upper-inner quadrant of  left breast in female, estrogen receptor positive ([place]) 02/21/2019  . Uterine leiomyoma 09/29/2015  . Fibroids 10/28/2014    Class: History of  . Female infertility 03/19/2014  . Numbness of face 01/04/2014  . Other malaise and fatigue 01/04/2014  . Disturbance of skin sensation 01/04/2014  . Dizziness and giddiness 08/28/2013  . Condyloma 05/31/2013  . Anemia, iron deficiency 12/25/2012  . HYPERTENSION 01/30/2009  . CYSTIC ACNE 01/30/2009  . HYPERSOMNIA 01/30/2009  . PALPITATIONS, HX OF 01/30/2009   Past Medical History:  Diagnosis Date  . Abnormal Pap smear 7/13   ASCUS/positive HR HPV, negative colpo  . Anemia 2006  . Cancer ([place])    Breast- left  . 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 Family history of cancer   . Fibroids    with enlarged uterus  . GERD (gastroesophageal reflux disease)   . Hypertension   . Pulmonary embolism ([place]) 2008    secondary to OCP  . Varices of esophagus determined by endoscopy [facility])     Family History  Problem Relation Age of Onset  . Diabetes Mother   . Hypertension Mother   . Heart disease Mother        cardiomegaly  . Lupus Mother   . Diabetes Father   . Hypertension Father   . Stroke Father   . Hypertension Maternal Grandmother   . Stroke Maternal Grandmother   . Hypertension Sister   . Seizures Sister   . COPD Sister   . Ovarian cancer Paternal Aunt     Past Surgical History:  Procedure Laterality Date  . BREAST LUMPECTOMY WITH RADIOACTIVE SEED LOCALIZATION Left 03/20/2019   Procedure: LEFT BREAST LUMPECTOMY WITH RADIOACTIVE SEED LOCALIZATION;  Surgeon: Coralie Keens, MD;  Location: [place];  Service: General;  Laterality: Left;  . ESOPHAGOGASTRODUODENOSCOPY  2020  . IR GENERIC HISTORICAL  03/31/2016   IR RADIOLOGIST EVAL & MGMT 03/31/2016 Marybelle Killings, MD [facility] INTERV RAD  . IR GENERIC HISTORICAL  01/06/2016   IR RADIOLOGIST EVAL & MGMT 01/06/2016 Marybelle Killings, MD [facility] INTERV RAD  . left finger reattachment  age 87  . LEFT OOPHORECTOMY Left 1998      . MYOMECTOMY  2003  . MYOMECTOMY  2008   via laparoscopy  . RE-EXCISION OF BREAST CANCER,SUPERIOR MARGINS Left 04/06/2019   Procedure: RE-EXCISION OF LEFT BREAST CANCER MARGINS;  Surgeon: Coralie Keens, MD;  Location: [employer];  Service: General;  Laterality: Left;   Social History   Occupational History  . Occupation: Insurance account manager: [employer]  Tobacco Use  . Smoking status: Never Smoker  . Smokeless tobacco: Never Used  Vaping Use  . Vaping Use: Never used  Substance and Sexual Activity  . Alcohol use: Yes    Alcohol/week: 0.0 - 1.0 standard drinks    Comment: occasional wine  . Drug use: No  . Sexual activity: Yes    Partners: Male    Birth control/protection: None

## 2019-12-28 ENCOUNTER — Telehealth: Payer: Self-pay

## 2019-12-28 NOTE — Telephone Encounter (Signed)
Spoke with patient. Patient reports vaginal discomfort that started 2 days ago, requesting OV with Dr. Sabra Heck. Denies any other GYN symptoms. OV scheduled for 01/07/20 at 9am with Dr. Sabra Heck.   Patient asking if there is anything she can try for yeast OTC? Reviewed option of OTC monistat 1, 3 or 7 day. Advised if new symptoms develop or symptoms worsen, return call to office. Patient verbalizes understanding and is agreeable.   Last AEX 08/03/19  Encounter closed.

## 2019-12-28 NOTE — Telephone Encounter (Signed)
Patient is calling in regards to having "vaginal discomfort".

## 2019-12-31 ENCOUNTER — Telehealth: Payer: Self-pay

## 2019-12-31 NOTE — Telephone Encounter (Signed)
Patient is calling in regards to wanting to follow up with nurse about recent symptoms.

## 2019-12-31 NOTE — Telephone Encounter (Signed)
Spoke with patient. Patient f/u on 8/13 telephone call. Reports she tried OTC Monistat 3 day, provided some relief of symptoms. Reports vaginal discomfort now intermittent, requesting earlier OV. Currently scheduled for 8/23.   OV scheduled for 8/17 at 1:45pm with Dr. Sabra Heck, advised this is a work in appt. Advised I will review with D.r Sabra Heck and return call if any additional recommendations. Patient agreeable.   Dr. Sabra Heck -ok to proceed as scheduled?

## 2019-12-31 NOTE — Progress Notes (Deleted)
GYNECOLOGY  VISIT  CC:   ***  HPI: 54 y.o. G18P0030 Married [name] here for vaginitis.  GYNECOLOGIC HISTORY: No LMP recorded. Contraception: *** Menopausal hormone therapy: ***  Patient Active Problem List   Diagnosis Date Noted  . Protrusion of cervical intervertebral disc 11/07/2019  . Genetic testing 03/16/2019  . Family history of cancer   . 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 Uterine leiomyoma 09/29/2015  . Fibroids 10/28/2014    Class: History of  . [place] infertility 03/19/2014  . Numbness of face 01/04/2014  . Other malaise and fatigue 01/04/2014  . Disturbance of skin sensation 01/04/2014  . Dizziness and giddiness 08/28/2013  . Condyloma 05/31/2013  . Anemia, iron deficiency 12/25/2012  . HYPERTENSION 01/30/2009  . CYSTIC ACNE 01/30/2009  . HYPERSOMNIA 01/30/2009  . PALPITATIONS, HX OF 01/30/2009    Past Medical History:  Diagnosis Date  . Abnormal Pap smear 7/13   ASCUS/positive HR HPV, negative colpo  . Anemia 2006  . Cancer ([place])    Breast- left  . Condylomata acuminata in [place]   . Family history of cancer   . Fibroids    with enlarged uterus  . GERD (gastroesophageal reflux disease)   . Hypertension   . Pulmonary embolism ([place]) 2008   secondary to OCP  . 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 BREAST LUMPECTOMY WITH RADIOACTIVE SEED LOCALIZATION Left 03/20/2019   Procedure: LEFT BREAST LUMPECTOMY WITH RADIOACTIVE SEED LOCALIZATION;  Surgeon: Coralie Keens, MD;  Location: [place];  Service: General;  Laterality: Left;  . ESOPHAGOGASTRODUODENOSCOPY  2020  . IR GENERIC HISTORICAL  03/31/2016   IR RADIOLOGIST EVAL & MGMT 03/31/2016 Marybelle Killings, MD [facility] INTERV RAD  . IR GENERIC HISTORICAL  01/06/2016   IR RADIOLOGIST EVAL & MGMT 01/06/2016 Marybelle Killings, MD [facility] INTERV RAD   . left finger reattachment  age 62  . LEFT OOPHORECTOMY Left 1998      . MYOMECTOMY  2003  . MYOMECTOMY  2008   via laparoscopy  . RE-EXCISION OF BREAST CANCER,SUPERIOR MARGINS Left 04/06/2019   Procedure: RE-EXCISION OF LEFT BREAST CANCER MARGINS;  Surgeon: Coralie Keens, MD;  Location: [place];  Service: General;  Laterality: Left;    MEDS:   Current Outpatient Medications on File Prior to Visit  Medication Sig Dispense Refill  . ALPRAZolam (XANAX) 0.25 MG tablet Take 0.25 mg by mouth 2 (two) times daily as needed for anxiety.     Marland Kitchen amLODipine-valsartan (EXFORGE) 10-320 MG tablet Take 1 tablet by mouth daily.     . Ascorbic Acid (VITAMIN C) 1000 MG tablet Take 1,000 mg by mouth daily.     . Cholecalciferol (VITAMIN D3) 125 MCG (5000 UT) CAPS Take 5,000 Units by mouth daily.     . cyclobenzaprine (FLEXERIL) 5 MG tablet Take 1 tablet (5 mg total) by mouth 3 (three) times daily as needed for muscle spasms. 10 tablet 0  . Dermatological Products, Misc. [facility]) lotion EpiCeram topical emulsion, extended release  APPLY TO LEFT CHEST THROUGHOUT THE DAY    . dexlansoprazole (DEXILANT) 60 MG capsule Take 60 mg by mouth every morning.     . ferrous sulfate 325 (65 FE) MG tablet Take 325 mg by mouth daily with breakfast.     . folic acid (FOLVITE) 1 MG tablet Take 1 tablet (1 mg total) by  mouth daily. 90 tablet 3  . hydrochlorothiazide (HYDRODIURIL) 25 MG tablet Take 25 mg by mouth daily.     Marland Kitchen ibuprofen (ADVIL) 600 MG tablet Take 600 mg by mouth every 6 (six) hours as needed.    Marland Kitchen ketoconazole (NIZORAL) 2 % cream Apply 1 application topically daily as needed for irritation.     Marland Kitchen LINZESS 145 MCG CAPS capsule Take 145 mcg by mouth as needed.    . predniSONE (DELTASONE) 20 MG tablet Take 60 mg daily x 2 days then 40 mg daily x 2 days then 20 mg daily x 2 days 12 tablet 0  . Probiotic Product (PROBIOTIC-10) CAPS Take 1 capsule by mouth daily.     Marland Kitchen SPIRULINA PO Take 1 capsule by mouth daily.      . tazarotene (TAZORAC) 0.05 % cream Apply 1 application topically at bedtime as needed (acne).     . Toremifene Citrate (FARESTON) 60 MG tablet Take 1 tablet (60 mg total) by mouth daily. 90 tablet 3   No current facility-administered medications on file prior to visit.    ALLERGIES: Minocycline hcl and Sulfa antibiotics  Family History  Problem Relation Age of Onset  . Diabetes Mother   . Hypertension Mother   . Heart disease Mother        cardiomegaly  . Lupus Mother   . Diabetes Father   . Hypertension Father   . Stroke Father   . Hypertension Maternal Grandmother   . Stroke Maternal Grandmother   . Hypertension Sister   . Seizures Sister   . COPD Sister   . Ovarian cancer Paternal Aunt     SH:  ***  Review of Systems  PHYSICAL EXAMINATION:    There were no vitals taken for this visit.    General appearance: alert, cooperative and appears stated age Neck: no adenopathy, supple, symmetrical, trachea midline and thyroid {CHL AMB PHY EX THYROID NORM DEFAULT:[id number]::"normal to inspection and palpation"} CV:  {Exam; heart brief:31539} Lungs:  {pe lungs ob:314451::"clear to auscultation, no wheezes, rales or rhonchi, symmetric air entry"} Breasts: {Exam; breast:13139::"normal appearance, no masses or tenderness"} Abdomen: soft, non-tender; bowel sounds normal; no masses,  no organomegaly Lymph:  no inguinal LAD noted  Pelvic: External genitalia:  no lesions              Urethra:  normal appearing urethra with no masses, tenderness or lesions              Bartholins and Skenes: normal                 Vagina: normal appearing vagina with normal color and discharge, no lesions              Cervix: {CHL AMB PHY EX CERVIX NORM DEFAULT:[phone]::"no lesions"}              Bimanual Exam:  Uterus:  {CHL AMB PHY EX UTERUS NORM [phone]::"normal size, contour, position, consistency, mobility, non-tender"}              Adnexa: {CHL AMB PHY EX ADNEXA NO MASS  [phone]::"no mass, fullness, tenderness"}              Rectovaginal: {yes no:314532}.  Confirms.              Anus:  normal sphincter tone, no lesions  Chaperone, ***Terence Lux, CMA, was present for exam.  Assessment: ***  Plan: ***   ~{NUMBERS; -10-45 JOINT ROM:10287} minutes spent with patient >50% of  time was in face to face discussion of above.

## 2019-12-31 NOTE — Progress Notes (Signed)
GYNECOLOGY  VISIT  CC:   Vaginal burning  HPI: 54 y.o. G72P0030 Married [place] female here for vaginal burning that comes & goes that started about a week ago.  She denies vaginal discharge or odor.  Tried metronidazole cream that she used for three days.  This seemed to help.  Denies vaginal bleeding.  She was in [place] for about a week and reports she was so hot/sweaty while she was there.  She felt like she was "damp" the whole time she was there.  She went to [employer] for a day and got really wet on a ride.  Felt like she stayed wet the entire day.  Noticed these symptoms after the day at [employer].    Last pap 07/2019 and HR HPV was negative at that time as well.  She started Keflex about a week ago.  This is from her dermatologist and she will be on this for 10 days total.    GYNECOLOGIC HISTORY: No LMP recorded. (Menstrual status: Irregular Periods). Contraception: none Menopausal hormone therapy: none  Patient Active Problem List   Diagnosis Date Noted  . Protrusion of cervical intervertebral disc 11/07/2019  . Genetic testing 03/16/2019  . Family history of cancer   . Malignant neoplasm of upper-inner quadrant of left breast in female, estrogen receptor positive ([place]) 02/21/2019  . Uterine leiomyoma 09/29/2015  . Fibroids 10/28/2014    Class: History of  . Female infertility 03/19/2014  . Numbness of face 01/04/2014  . Other malaise and fatigue 01/04/2014  . Disturbance of skin sensation 01/04/2014  . Dizziness and giddiness 08/28/2013  . Condyloma 05/31/2013  . Anemia, iron deficiency 12/25/2012  . HYPERTENSION 01/30/2009  . CYSTIC ACNE 01/30/2009  . HYPERSOMNIA 01/30/2009  . PALPITATIONS, HX OF 01/30/2009    Past Medical History:  Diagnosis Date  . Abnormal Pap smear 7/13   ASCUS/positive HR HPV, negative colpo  . Anemia 2006  . Cancer ([place])    Breast- left  . 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 Family history of cancer   . Fibroids     with enlarged uterus  . GERD (gastroesophageal reflux disease)   . Hypertension   . Pulmonary embolism ([place]) 2008   secondary to OCP  . 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 BREAST LUMPECTOMY WITH RADIOACTIVE SEED LOCALIZATION Left 03/20/2019   Procedure: LEFT BREAST LUMPECTOMY WITH RADIOACTIVE SEED LOCALIZATION;  Surgeon: Coralie Keens, MD;  Location: [place];  Service: General;  Laterality: Left;  . ESOPHAGOGASTRODUODENOSCOPY  2020  . IR GENERIC HISTORICAL  03/31/2016   IR RADIOLOGIST EVAL & MGMT 03/31/2016 Marybelle Killings, MD [facility] INTERV RAD  . IR GENERIC HISTORICAL  01/06/2016   IR RADIOLOGIST EVAL & MGMT 01/06/2016 Marybelle Killings, MD [facility] INTERV RAD  . left finger reattachment  age 10  . LEFT OOPHORECTOMY Left 1998      . MYOMECTOMY  2003  . MYOMECTOMY  2008   via laparoscopy  . RE-EXCISION OF BREAST CANCER,SUPERIOR MARGINS Left 04/06/2019   Procedure: RE-EXCISION OF LEFT BREAST CANCER MARGINS;  Surgeon: Coralie Keens, MD;  Location: [place];  Service: General;  Laterality: Left;    MEDS:   Current Outpatient Medications on File Prior to Visit  Medication Sig Dispense Refill  . ALPRAZolam (XANAX) 0.25 MG tablet Take 0.25 mg by mouth 2 (two) times daily as needed for anxiety.     Marland Kitchen amLODipine-valsartan (EXFORGE)  10-320 MG tablet Take 1 tablet by mouth daily.     . Ascorbic Acid (VITAMIN C) 1000 MG tablet Take 1,000 mg by mouth daily.     . cephALEXin (KEFLEX) 750 MG capsule Take 750 mg by mouth 2 (two) times daily.    . Cholecalciferol (VITAMIN D3) 125 MCG (5000 UT) CAPS Take 5,000 Units by mouth daily.     . Dermatological Products, Misc. [facility]) lotion EpiCeram topical emulsion, extended release  APPLY TO LEFT CHEST THROUGHOUT THE DAY    . folic acid (FOLVITE) 1 MG tablet Take 1 tablet (1 mg total) by mouth daily. 90 tablet 3  . hydrochlorothiazide (HYDRODIURIL) 25 MG tablet Take 25 mg by mouth  daily.     Marland Kitchen ibuprofen (ADVIL) 600 MG tablet Take 600 mg by mouth every 6 (six) hours as needed.    Marland Kitchen ketoconazole (NIZORAL) 2 % cream Apply 1 application topically daily as needed for irritation.     Marland Kitchen LINZESS 145 MCG CAPS capsule Take 145 mcg by mouth as needed.    . tazarotene (TAZORAC) 0.05 % cream Apply 1 application topically at bedtime as needed (acne).     . Toremifene Citrate (FARESTON) 60 MG tablet Take 1 tablet (60 mg total) by mouth daily. 90 tablet 3   No current facility-administered medications on file prior to visit.    ALLERGIES: Minocycline hcl and Sulfa antibiotics  Family History  Problem Relation Age of Onset  . Diabetes Mother   . Hypertension Mother   . Heart disease Mother        cardiomegaly  . Lupus Mother   . Diabetes Father   . Hypertension Father   . Stroke Father   . Hypertension Maternal Grandmother   . Stroke Maternal Grandmother   . Hypertension Sister   . Seizures Sister   . COPD Sister   . Ovarian cancer Paternal Aunt     SH:  Married, non smoker  Review of Systems  Constitutional: Negative.   HENT: Negative.   Eyes: Negative.   Respiratory: Negative.   Cardiovascular: Negative.   Gastrointestinal: Negative.   Endocrine: Negative.   Genitourinary:       Vaginal burning  Musculoskeletal: Negative.   Skin: Negative.   Allergic/Immunologic: Negative.   Neurological: Negative.   Hematological: Negative.   Psychiatric/Behavioral: Negative.     PHYSICAL EXAMINATION:    BP 118/80   Pulse 68   Resp 16   Wt 195 lb (88.5 kg)   BMI 32.45 kg/m     General appearance: alert, cooperative and appears stated age Abdomen: soft, non-tender; bowel sounds normal; no masses,  no organomegaly Lymph:  no inguinal LAD noted  Pelvic: External genitalia:  no lesions              Urethra:  normal appearing urethra with no masses, tenderness or lesions              Bartholins and Skenes: normal                 Vagina: normal appearing vagina  with normal color, whitish discharge noted, no lesions              Cervix: no lesions              Bimanual Exam:  Uterus:  Enlarged about 14 weeks size, mobile              Adnexa: no mass, fullness, tenderness  Chaperone, Royal Hawthorn, CMA, was  present for exam.  Assessment: Vaginal irritation H/o [place] with fibroids  Plan: Affirm pending Terazol 7 nightly x 7 nights.

## 2019-12-31 NOTE — Telephone Encounter (Signed)
Yes, that is fine.  Thank you.

## 2020-01-01 ENCOUNTER — Encounter: Payer: Self-pay | Admitting: Obstetrics & Gynecology

## 2020-01-01 ENCOUNTER — Other Ambulatory Visit: Payer: Self-pay

## 2020-01-01 ENCOUNTER — Ambulatory Visit (INDEPENDENT_AMBULATORY_CARE_PROVIDER_SITE_OTHER): Payer: BC Managed Care – PPO | Admitting: Obstetrics & Gynecology

## 2020-01-01 VITALS — BP 118/80 | HR 68 | Resp 16 | Wt 195.0 lb

## 2020-01-01 DIAGNOSIS — N898 Other specified noninflammatory disorders of vagina: Secondary | ICD-10-CM | POA: Diagnosis not present

## 2020-01-01 MED ORDER — TERCONAZOLE 0.4 % VA CREA: 1.0000 | TOPICAL_CREAM | Freq: Every day | VAGINAL | 0 refills | Status: DC

## 2020-01-01 NOTE — Telephone Encounter (Signed)
Encounter closed

## 2020-01-02 ENCOUNTER — Encounter: Payer: Self-pay | Admitting: Obstetrics & Gynecology

## 2020-01-02 LAB — VAGINITIS/VAGINOSIS, DNA PROBE
Candida Species: NEGATIVE
Gardnerella vaginalis: NEGATIVE
Trichomonas vaginosis: NEGATIVE

## 2020-01-03 ENCOUNTER — Telehealth: Payer: Self-pay | Admitting: Obstetrics & Gynecology

## 2020-01-03 NOTE — Telephone Encounter (Signed)
-----   Message from Megan Salon, MD sent at 01/02/2020  3:32 PM EDT ----- Please let [name] know the vaginitis testing was negative.  I think she should use the terazol 7 and give me an update when it is completed.

## 2020-01-03 NOTE — Telephone Encounter (Signed)
[place], Hannah Grammes  P [facility] I saw my Results were negative. I did double check the treatment I used and it was treatment for a yeast infection. Suggestions for next steps?

## 2020-01-03 NOTE — Telephone Encounter (Signed)
Left message for call back.

## 2020-01-03 NOTE — Telephone Encounter (Signed)
Patient returned call to Joy. °

## 2020-01-03 NOTE — Telephone Encounter (Signed)
Patient notified of results as written by provider. She stated she used the monistat previously before coming to her appointment. She will give [place] an update when she finishes the terazol 7.

## 2020-01-07 ENCOUNTER — Ambulatory Visit: Payer: Self-pay | Admitting: Obstetrics & Gynecology

## 2020-01-11 ENCOUNTER — Other Ambulatory Visit: Payer: Self-pay

## 2020-01-11 ENCOUNTER — Ambulatory Visit (INDEPENDENT_AMBULATORY_CARE_PROVIDER_SITE_OTHER): Payer: BC Managed Care – PPO | Admitting: Obstetrics & Gynecology

## 2020-01-11 ENCOUNTER — Encounter: Payer: Self-pay | Admitting: Obstetrics & Gynecology

## 2020-01-11 ENCOUNTER — Telehealth: Payer: Self-pay

## 2020-01-11 VITALS — BP 112/70 | HR 70 | Resp 16 | Wt 196.0 lb

## 2020-01-11 DIAGNOSIS — N898 Other specified noninflammatory disorders of vagina: Secondary | ICD-10-CM

## 2020-01-11 DIAGNOSIS — N912 Amenorrhea, unspecified: Secondary | ICD-10-CM | POA: Diagnosis not present

## 2020-01-11 MED ORDER — NONFORMULARY OR COMPOUNDED ITEM: 3 refills | Status: DC

## 2020-01-11 NOTE — Progress Notes (Signed)
GYNECOLOGY  VISIT  CC:   Vaginal irritation  HPI: 54 y.o. G78P0030 Married [name] here for vaginitis follow up.  Pt has been tested for vaginitis including yeast, BV, and trich.  Does not feel she needs additional STD testing and she does not have vaginal discharge.  Her primary concern is discomfort after intercourse that feels like irritated skin.  D/w pt possibly she is menopausal and need Vit E prescription for treatment.  Has recent [facility] that is low but pt is taking SERM.  Denies vaginal bleeding.  Cycles are becoming more irregular.  May need something else for contraception.  GYNECOLOGIC HISTORY: No LMP recorded. (Menstrual status: Irregular Periods). Contraception: none Menopausal hormone therapy: none  Patient Active Problem List   Diagnosis Date Noted  . Protrusion of cervical intervertebral disc 11/07/2019  . Genetic testing 03/16/2019  . Family history of cancer   . 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 Uterine leiomyoma 09/29/2015  . Fibroids 10/28/2014    Class: History of  . [place] infertility 03/19/2014  . Numbness of face 01/04/2014  . Other malaise and fatigue 01/04/2014  . Disturbance of skin sensation 01/04/2014  . Dizziness and giddiness 08/28/2013  . Condyloma 05/31/2013  . Anemia, iron deficiency 12/25/2012  . HYPERTENSION 01/30/2009  . CYSTIC ACNE 01/30/2009  . HYPERSOMNIA 01/30/2009  . PALPITATIONS, HX OF 01/30/2009    Past Medical History:  Diagnosis Date  . Abnormal Pap smear 7/13   ASCUS/positive HR HPV, negative colpo  . Anemia 2006  . Cancer ([place])    Breast- left  . Condylomata acuminata in [place]   . Family history of cancer   . Fibroids    with enlarged uterus  . GERD (gastroesophageal reflux disease)   . Hypertension   . Pulmonary embolism ([place]) 2008   secondary to OCP  . 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 BREAST LUMPECTOMY WITH RADIOACTIVE SEED LOCALIZATION Left 03/20/2019   Procedure: LEFT BREAST LUMPECTOMY WITH RADIOACTIVE SEED LOCALIZATION;  Surgeon: Coralie Keens, MD;  Location: [place];  Service: General;  Laterality: Left;  . ESOPHAGOGASTRODUODENOSCOPY  2020  . IR GENERIC HISTORICAL  03/31/2016   IR RADIOLOGIST EVAL & MGMT 03/31/2016 Marybelle Killings, MD [facility] INTERV RAD  . IR GENERIC HISTORICAL  01/06/2016   IR RADIOLOGIST EVAL & MGMT 01/06/2016 Marybelle Killings, MD [facility] INTERV RAD  . left finger reattachment  age 49  . LEFT OOPHORECTOMY Left 1998      . MYOMECTOMY  2003  . MYOMECTOMY  2008   via laparoscopy  . RE-EXCISION OF BREAST CANCER,SUPERIOR MARGINS Left 04/06/2019   Procedure: RE-EXCISION OF LEFT BREAST CANCER MARGINS;  Surgeon: Coralie Keens, MD;  Location: [place];  Service: General;  Laterality: Left;    MEDS:   Current Outpatient Medications on File Prior to Visit  Medication Sig Dispense Refill  . ALPRAZolam (XANAX) 0.25 MG tablet Take 0.25 mg by mouth 2 (two) times daily as needed for anxiety.     Marland Kitchen amLODipine-valsartan (EXFORGE) 10-320 MG tablet Take 1 tablet by mouth daily.     . Cholecalciferol (VITAMIN D3) 125 MCG (5000 UT) CAPS Take 5,000 Units by mouth daily.     . Dermatological Products, Misc. [facility]) lotion EpiCeram topical emulsion, extended release  APPLY TO LEFT CHEST THROUGHOUT THE DAY    . folic acid (FOLVITE) 1  MG tablet Take 1 tablet (1 mg total) by mouth daily. 90 tablet 3  . hydrochlorothiazide (HYDRODIURIL) 25 MG tablet Take 25 mg by mouth daily.     Marland Kitchen ibuprofen (ADVIL) 600 MG tablet Take 600 mg by mouth every 6 (six) hours as needed.    Marland Kitchen ketoconazole (NIZORAL) 2 % cream Apply 1 application topically daily as needed for irritation.     . Multiple Vitamins-Minerals (ZINC PO) Take by mouth.    . tazarotene (TAZORAC) 0.05 % cream Apply 1 application topically at bedtime as needed (acne).     .  Toremifene Citrate (FARESTON) 60 MG tablet Take 1 tablet (60 mg total) by mouth daily. 90 tablet 3  . cephALEXin (KEFLEX) 750 MG capsule Take 750 mg by mouth 2 (two) times daily. (Patient not taking: Reported on 01/11/2020)     No current facility-administered medications on file prior to visit.    ALLERGIES: Minocycline hcl and Sulfa antibiotics  Family History  Problem Relation Age of Onset  . Diabetes Mother   . Hypertension Mother   . Heart disease Mother        cardiomegaly  . Lupus Mother   . Diabetes Father   . Hypertension Father   . Stroke Father   . Hypertension Maternal Grandmother   . Stroke Maternal Grandmother   . Hypertension Sister   . Seizures Sister   . COPD Sister   . Ovarian cancer Paternal Aunt     SH:  Married, non smoker  Review of Systems  Constitutional: Negative.   HENT: Negative.   Eyes: Negative.   Respiratory: Negative.   Cardiovascular: Negative.   Gastrointestinal: Negative.   Endocrine: Negative.   Genitourinary:       Vaginal burning  Musculoskeletal: Negative.   Skin: Negative.   Allergic/Immunologic: Negative.   Neurological: Negative.   Hematological: Negative.   Psychiatric/Behavioral: Negative.     PHYSICAL EXAMINATION:    BP 112/70   Pulse 70   Resp 16   Wt 196 lb (88.9 kg)   BMI 32.62 kg/m     General appearance: alert, cooperative and appears stated age Pelvic exam not repeated today  Assessment: Vaginal irritation, possibly hormonal  Plan: Estradiol obtained today.   Will try Vit E vaginal suppositories 200u/ml, one PV three times weekly.  Would consider using even if estradiol level is in normal range.  Pt will be called with results.   22 minutes spent with pt

## 2020-01-11 NOTE — Telephone Encounter (Signed)
Spoke with pt. Pt states having same vaginal irritation, burning on her external area since being seen last on 01/01/20 for OV. Pt had neg vaginitis testing on 01/02/20. Pt was treated with Terazol cream which states helped and finished on Wed this week. Pt denies any fever, chills.   Pt advised to have OV for evaluation. Pt agreeable. Pt scheduled today with Dr Sabra Heck at 4 pm. Pt verbalized understanding to date and time of appt.  Encounter closed.

## 2020-01-11 NOTE — Telephone Encounter (Signed)
Patient is calling to speak with Joy in regards "to an issue I have and Caryl Asp will know what I'm talking about". Patient stated she would also like to update Joy on this matter.   Routing to [employer] and [employer].

## 2020-01-12 LAB — ESTRADIOL: Estradiol: 271 pg/mL

## 2020-02-04 ENCOUNTER — Inpatient Hospital Stay: Payer: BC Managed Care – PPO | Attending: Genetic Counselor

## 2020-02-04 ENCOUNTER — Inpatient Hospital Stay (HOSPITAL_BASED_OUTPATIENT_CLINIC_OR_DEPARTMENT_OTHER): Payer: BC Managed Care – PPO | Admitting: Hematology & Oncology

## 2020-02-04 ENCOUNTER — Telehealth: Payer: Self-pay | Admitting: Hematology & Oncology

## 2020-02-04 ENCOUNTER — Other Ambulatory Visit: Payer: Self-pay

## 2020-02-04 ENCOUNTER — Telehealth: Payer: Self-pay

## 2020-02-04 VITALS — BP 122/70 | HR 85 | Temp 99.1°F | Resp 17 | Wt 196.8 lb

## 2020-02-04 DIAGNOSIS — Z791 Long term (current) use of non-steroidal anti-inflammatories (NSAID): Secondary | ICD-10-CM | POA: Insufficient documentation

## 2020-02-04 DIAGNOSIS — Z79899 Other long term (current) drug therapy: Secondary | ICD-10-CM | POA: Diagnosis not present

## 2020-02-04 DIAGNOSIS — C50212 Malignant neoplasm of upper-inner quadrant of left female breast: Secondary | ICD-10-CM | POA: Diagnosis not present

## 2020-02-04 DIAGNOSIS — D500 Iron deficiency anemia secondary to blood loss (chronic): Secondary | ICD-10-CM | POA: Insufficient documentation

## 2020-02-04 DIAGNOSIS — N921 Excessive and frequent menstruation with irregular cycle: Secondary | ICD-10-CM | POA: Insufficient documentation

## 2020-02-04 DIAGNOSIS — Z170 Estrogen receptor positive status [ER+]: Secondary | ICD-10-CM | POA: Insufficient documentation

## 2020-02-04 DIAGNOSIS — M50221 Other cervical disc displacement at C4-C5 level: Secondary | ICD-10-CM | POA: Insufficient documentation

## 2020-02-04 DIAGNOSIS — Z923 Personal history of irradiation: Secondary | ICD-10-CM | POA: Diagnosis not present

## 2020-02-04 DIAGNOSIS — D0512 Intraductal carcinoma in situ of left breast: Secondary | ICD-10-CM | POA: Insufficient documentation

## 2020-02-04 DIAGNOSIS — N979 Female infertility, unspecified: Secondary | ICD-10-CM | POA: Diagnosis not present

## 2020-02-04 DIAGNOSIS — R200 Anesthesia of skin: Secondary | ICD-10-CM

## 2020-02-04 LAB — CBC WITH DIFFERENTIAL (CANCER CENTER ONLY)
Abs Immature Granulocytes: 0.08 10*3/uL — ABNORMAL HIGH (ref 0.00–0.07)
Basophils Absolute: 0 10*3/uL (ref 0.0–0.1)
Basophils Relative: 1 %
Eosinophils Absolute: 0.1 10*3/uL (ref 0.0–0.5)
Eosinophils Relative: 2 %
HCT: 38.4 % (ref 36.0–46.0)
Hemoglobin: 12.1 g/dL (ref 12.0–15.0)
Immature Granulocytes: 2 %
Lymphocytes Relative: 23 %
Lymphs Abs: 1.1 10*3/uL (ref 0.7–4.0)
MCH: 29.5 pg (ref 26.0–34.0)
MCHC: 31.5 g/dL (ref 30.0–36.0)
MCV: 93.7 fL (ref 80.0–100.0)
Monocytes Absolute: 0.4 10*3/uL (ref 0.1–1.0)
Monocytes Relative: 8 %
Neutro Abs: 3 10*3/uL (ref 1.7–7.7)
Neutrophils Relative %: 64 %
Platelet Count: 269 10*3/uL (ref 150–400)
RBC: 4.1 MIL/uL (ref 3.87–5.11)
RDW: 12.9 % (ref 11.5–15.5)
WBC Count: 4.6 10*3/uL (ref 4.0–10.5)
nRBC: 0 % (ref 0.0–0.2)

## 2020-02-04 LAB — CMP (CANCER CENTER ONLY)
ALT: 25 U/L (ref 0–44)
AST: 18 U/L (ref 15–41)
Albumin: 3.7 g/dL (ref 3.5–5.0)
Alkaline Phosphatase: 49 U/L (ref 38–126)
Anion gap: 7 (ref 5–15)
BUN: 13 mg/dL (ref 6–20)
CO2: 29 mmol/L (ref 22–32)
Calcium: 9.4 mg/dL (ref 8.9–10.3)
Chloride: 102 mmol/L (ref 98–111)
Creatinine: 0.75 mg/dL (ref 0.44–1.00)
GFR, Est AFR Am: >60 mL/min (ref >60)
GFR, Estimated: >60 mL/min (ref >60)
Glucose, Bld: 114 mg/dL — ABNORMAL HIGH (ref 70–99)
Potassium: 3.6 mmol/L (ref 3.5–5.1)
Sodium: 138 mmol/L (ref 135–145)
Total Bilirubin: 0.4 mg/dL (ref 0.3–1.2)
Total Protein: 7.9 g/dL (ref 6.5–8.1)

## 2020-02-04 LAB — IRON AND TIBC
Iron: 69 ug/dL (ref 41–142)
Saturation Ratios: 29 % (ref 21–57)
TIBC: 243 ug/dL (ref 236–444)
UIBC: 173 ug/dL (ref 120–384)

## 2020-02-04 LAB — FERRITIN: Ferritin: 39 ng/mL (ref 11–307)

## 2020-02-04 LAB — TSH: TSH: 0.917 u[IU]/mL (ref 0.308–3.960)

## 2020-02-04 NOTE — Telephone Encounter (Signed)
-----   Message from [name], MD sent at 02/04/2020  1:59 PM EDT ----- Call - the iron and thyroid levels are great!!  Laurey Arrow

## 2020-02-04 NOTE — Telephone Encounter (Signed)
Appointments scheduled and patient has My Chart Access per 9/20 los

## 2020-02-04 NOTE — Progress Notes (Signed)
Hematology and Oncology Follow Up Visit  [name] [id number] May 21, 1965 53 y.o. 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 daily-start on 07/08/2018  Radiation therapy -- completed 5200 rad - on 06/20/2019     Interim History:  [name] is back for follow-up.  She and her husband just got back from [place].  They are down there last week.  They had a wonderful time.  Their anniversary is actually coming up this Friday.  Down in [place], they did a lot of activities.  They had no problems.  They had to be tested for COVID and obviously were negative.  Otherwise, she is doing well.  Her left shoulder is not bothering her.  She went to the chiropractor and he really helped her out with the left shoulder.  She had an MRI of the neck which did seem to show a disc herniation at C4-5.  She has had no issues with the Farest[place].  She had no problems with hot flashes or sweats.  She has had no change in bowel or bladder habits.  There is no rashes.  There is no leg swelling.  She has had no cough or shortness of breath.  Overall, I would say her performance status is ECOG 1.    Medications:  Current Outpatient Medications:  .  Omega-3 Fatty Acids (FISH OIL PO), Take by mouth daily., Disp: , Rfl:  .  ALPRAZolam (XANAX) 0.25 MG tablet, Take 0.25 mg by mouth 2 (two) times daily as needed for anxiety. , Disp: , Rfl:  .  amLODipine-valsartan (EXFORGE) 10-320 MG tablet, Take 1 tablet by mouth daily. , Disp: , Rfl:  .  Cholecalciferol (VITAMIN D3) 125 MCG (5000 UT) CAPS, Take 5,000 Units by mouth daily. , Disp: , Rfl:  .  Dermatological Products, Misc. [facility]) lotion, EpiCeram topical emulsion, extended release  APPLY TO LEFT CHEST THROUGHOUT THE DAY, Disp: , Rfl:  .  folic acid (FOLVITE) 1 MG tablet, Take  1 tablet (1 mg total) by mouth daily., Disp: 90 tablet, Rfl: 3 .  hydrochlorothiazide (HYDRODIURIL) 25 MG tablet, Take 25 mg by mouth daily. , Disp: , Rfl:  .  ibuprofen (ADVIL) 600 MG tablet, Take 600 mg by mouth every 6 (six) hours as needed., Disp: , Rfl:  .  ketoconazole (NIZORAL) 2 % cream, Apply 1 application topically daily as needed for irritation. , Disp: , Rfl:  .  Multiple Vitamins-Minerals (ZINC PO), Take by mouth., Disp: , Rfl:  .  NONFORMULARY OR COMPOUNDED ITEM, Vitamin E vaginal suppositories 200u/ml.  One pv three times weekly., Disp: 36 each, Rfl: 3 .  tazarotene (TAZORAC) 0.05 % cream, Apply 1 application topically at bedtime as needed (acne). , Disp: , Rfl:  .  Toremifene Citrate (FARESTON) 60 MG tablet, Take 1 tablet (60 mg total) by mouth daily., Disp: 90 tablet, Rfl: 3  Allergies:  Allergies  Allergen Reactions  . Minocycline Hcl Hives and Itching  . Sulfa Antibiotics Swelling    Swell of the lips     Past Medical History, Surgical history, Social history, and Family History were reviewed and updated.  Review of Systems: Review of Systems  Constitutional: Negative.   HENT:  Negative.   Eyes: Negative.   Respiratory: Negative.   Cardiovascular: Negative.   Gastrointestinal:  Negative.   Endocrine: Negative.   Genitourinary: Negative.    Musculoskeletal: Negative.   Skin: Negative.   Neurological: Negative.   Hematological: Negative.   Psychiatric/Behavioral: Negative.     Physical Exam:  vitals were not taken for this visit.   Wt Readings from Last 3 Encounters:  01/11/20 196 lb (88.9 kg)  01/01/20 195 lb (88.5 kg)  11/27/19 194 lb (88 kg)    Physical Exam Vitals reviewed.  Constitutional:      Comments: On her breast exam, right breast was without masses, edema or erythema.  There is no right axillary adenopathy.  Left breast is quite hyperpigmented from radiation.  There is some healing areas of skin from the radiation.  There is some contraction  from radiation and  surgery.  She has the lumpectomy scar on the areola at about the 11 o'clock position.  There is no erythema.  There is no fullness.  There is no distinct mass in the left breast.  There is no left axillary adenopathy.  HENT:     Head: Normocephalic and atraumatic.  Eyes:     Pupils: Pupils are equal, round, and reactive to light.  Cardiovascular:     Rate and Rhythm: Normal rate and regular rhythm.     Heart sounds: Normal heart sounds.  Pulmonary:     Effort: Pulmonary effort is normal.     Breath sounds: Normal breath sounds.  Abdominal:     General: Bowel sounds are normal.     Palpations: Abdomen is soft.  Musculoskeletal:        General: No tenderness or deformity. Normal range of motion.     Cervical back: Normal range of motion.  Lymphadenopathy:     Cervical: No cervical adenopathy.  Skin:    General: Skin is warm and dry.     Findings: No erythema or rash.  Neurological:     Mental Status: She is alert and oriented to person, place, and time.  Psychiatric:        Behavior: Behavior normal.        Thought Content: Thought content normal.        Judgment: Judgment normal.      Lab Results  Component Value Date   WBC 4.6 02/04/2020   HGB 12.1 02/04/2020   HCT 38.4 02/04/2020   MCV 93.7 02/04/2020   PLT 269 02/04/2020     Chemistry      Component Value Date/Time   NA 140 11/05/2019 0803   NA 142 04/12/2017 0858   K 3.9 11/05/2019 0803   K 3.8 04/12/2017 0858   CL 102 11/05/2019 0803   CL 104 04/12/2017 0858   CO2 31 11/05/2019 0803   CO2 26 04/12/2017 0858   BUN 11 11/05/2019 0803   BUN 9 04/12/2017 0858   CREATININE 0.80 11/05/2019 0803   CREATININE 0.8 04/12/2017 0858      Component Value Date/Time   CALCIUM 9.7 11/05/2019 0803   CALCIUM 9.2 04/12/2017 0858   ALKPHOS 49 11/05/2019 0803   ALKPHOS 74 04/12/2017 0858   AST 17 11/05/2019 0803   ALT 21 11/05/2019 0803   ALT 26 04/12/2017 0858   BILITOT 0.5 11/05/2019 0803       Impression and Plan: [name] is a 54 year old African-American female.  She now has ductal carcinoma in situ of the left breast with microinvasion.    I forgot to mention that her monthly cycles are quite erratic.  Her gynecologist is monitoring this.  I also recommended that she did go parasailing down in [place].  She went with her 52 year old and.  I am incredibly impressed.  We will plan to get her back in another 3 months.  I hope that they have a wonderful anniversary on Friday.      [name], MD 9/20/20218:19 AM

## 2020-02-04 NOTE — Telephone Encounter (Signed)
Called and informed of lab results, patient verbalized understanding and denies any questions or concerns.

## 2020-02-05 ENCOUNTER — Ambulatory Visit: Payer: BC Managed Care – PPO | Admitting: Orthopaedic Surgery

## 2020-02-05 LAB — LUTEINIZING HORMONE: LH: 13.9 m[IU]/mL

## 2020-02-05 LAB — FOLLICLE STIMULATING HORMONE: FSH: 16.1 m[IU]/mL

## 2020-02-07 LAB — ESTRADIOL, ULTRA SENS: Estradiol, Sensitive: 26.7 pg/mL

## 2020-02-12 ENCOUNTER — Other Ambulatory Visit: Payer: Self-pay | Admitting: Hematology & Oncology

## 2020-02-12 DIAGNOSIS — C50212 Malignant neoplasm of upper-inner quadrant of left female breast: Secondary | ICD-10-CM

## 2020-02-18 ENCOUNTER — Telehealth: Payer: Self-pay

## 2020-02-18 NOTE — Telephone Encounter (Signed)
Patient would like to schedule a follow up appointment.

## 2020-02-18 NOTE — Telephone Encounter (Signed)
Spoke with patient. Patient reports ongoing intermittent vaginal irritation and burning sensation. Feels discharge in the vagina, but does not see any discharge. Denies vaginal itching, odor or bleeding. Vacationed in [place] 2-3 weeks ago, feels like symptoms increased after that trip. Symptoms not improved with use of Vaginal Vit E suppositories. OV recommended. Patient declined OV offered for 10/5, OV scheduled for 10/14 at 3:30pm with Dr. Sabra Heck.   Routing to provider for final review. Patient is agreeable to disposition. Will close encounter.

## 2020-02-25 NOTE — Progress Notes (Signed)
GYNECOLOGY  VISIT  CC:   Vaginal irritation  HPI: 54 y.o. G72P0030 Married [name] here for vaginal irritation.  This was initially noted in August.  She was seen 8/17 and 8/27.  Then she went to [place] in September.  She called in early October and started Vit E vaginal suppositories, twice weekly.  She has bene tested for vaginitis including yeast, BV and trich.  She does not feel she needs STD testing.  She feels wiping after urination sometimes triggers the irritation.  Typically, when she is home, she uses [employer] paper.  She's used different toilet papers while she was traveling to both [place] and [place].  Pt reports she is nervous about her uterus, fibroids, ovary and has concerns about future cancer risks.  Asks if can have a hysterectomy.  There really isn't any indication for this right now.    GYNECOLOGIC HISTORY: Patient's last menstrual period was 02/04/2020. Contraception: not in few weeks Menopausal hormone therapy: none  Patient Active Problem List   Diagnosis Date Noted  . Protrusion of cervical intervertebral disc 11/07/2019  . Genetic testing 03/16/2019  . Family history of cancer   . Malignant neoplasm of upper-inner quadrant of left breast in [place], estrogen receptor positive ([place]) 02/21/2019  . Uterine leiomyoma 09/29/2015  . Fibroids 10/28/2014    Class: History of  . [place] infertility 03/19/2014  . Numbness of face 01/04/2014  . Other malaise and fatigue 01/04/2014  . Disturbance of skin sensation 01/04/2014  . Dizziness and giddiness 08/28/2013  . Condyloma 05/31/2013  . Anemia, iron deficiency 12/25/2012  . HYPERTENSION 01/30/2009  . CYSTIC ACNE 01/30/2009  . HYPERSOMNIA 01/30/2009  . PALPITATIONS, HX OF 01/30/2009    Past Medical History:  Diagnosis Date  . Abnormal Pap smear 7/13   ASCUS/positive HR HPV, negative colpo  . Anemia 2006  . Cancer ([place])    Breast- left  . Condylomata acuminata in [place]   . Family  history of cancer   . Fibroids    with enlarged uterus  . GERD (gastroesophageal reflux disease)   . Hypertension   . Pulmonary embolism ([place]) 2008   secondary to OCP  . Varices of esophagus determined by endoscopy [facility])     Past Surgical History:  Procedure Laterality Date  . BREAST LUMPECTOMY WITH RADIOACTIVE SEED LOCALIZATION Left 03/20/2019   Procedure: LEFT BREAST LUMPECTOMY WITH RADIOACTIVE SEED LOCALIZATION;  Surgeon: Coralie Keens, MD;  Location: [facility];  Service: General;  Laterality: Left;  . ESOPHAGOGASTRODUODENOSCOPY  2020  . IR GENERIC HISTORICAL  03/31/2016   IR RADIOLOGIST EVAL & MGMT 03/31/2016 Marybelle Killings, MD [facility] INTERV RAD  . IR GENERIC HISTORICAL  01/06/2016   IR RADIOLOGIST EVAL & MGMT 01/06/2016 Marybelle Killings, MD [facility] INTERV RAD  . left finger reattachment  age 89  . LEFT OOPHORECTOMY Left 1998      . MYOMECTOMY  2003  . MYOMECTOMY  2008   via laparoscopy  . RE-EXCISION OF BREAST CANCER,SUPERIOR MARGINS Left 04/06/2019   Procedure: RE-EXCISION OF LEFT BREAST CANCER MARGINS;  Surgeon: Coralie Keens, MD;  Location: [facility];  Service: General;  Laterality: Left;    MEDS:   Current Outpatient Medications on File Prior to Visit  Medication Sig Dispense Refill  . amLODipine-valsartan (EXFORGE) 10-320 MG tablet Take 1 tablet by mouth daily.     . Cholecalciferol (VITAMIN D3) 125 MCG (5000 UT) CAPS Take 5,000 Units by mouth daily.     . Dermatological Products,  Misc. (EPICERAM) lotion EpiCeram topical emulsion, extended release  APPLY TO LEFT CHEST THROUGHOUT THE DAY    . folic acid (FOLVITE) 1 MG tablet Take 1 tablet (1 mg total) by mouth daily. 90 tablet 3  . hydrochlorothiazide (HYDRODIURIL) 25 MG tablet Take 25 mg by mouth daily.     Marland Kitchen ibuprofen (ADVIL) 600 MG tablet Take 600 mg by mouth every 6 (six) hours as needed.    Marland Kitchen ketoconazole (NIZORAL) 2 % cream Apply 1 application topically daily as needed for irritation.     . Multiple Vitamins-Minerals (ZINC  PO) Take by mouth.    . NONFORMULARY OR COMPOUNDED ITEM Vitamin E vaginal suppositories 200u/ml.  One pv three times weekly. 36 each 3  . Omega-3 Fatty Acids (FISH OIL PO) Take by mouth daily.    . Probiotic Product (PROBIOTIC PO) Take by mouth.    . tazarotene (TAZORAC) 0.05 % cream Apply 1 application topically at bedtime as needed (acne).     . Toremifene Citrate (FARESTON) 60 MG tablet Take 1 tablet (60 mg total) by mouth daily. 90 tablet 3   No current facility-administered medications on file prior to visit.    ALLERGIES: Minocycline hcl and Sulfa antibiotics  Family History  Problem Relation Age of Onset  . Diabetes Mother   . Hypertension Mother   . Heart disease Mother        cardiomegaly  . Lupus Mother   . Diabetes Father   . Hypertension Father   . Stroke Father   . Hypertension Maternal Grandmother   . Stroke Maternal Grandmother   . Hypertension Sister   . Seizures Sister   . COPD Sister   . Ovarian cancer Paternal Aunt     SH:  Married, non smoker  Review of Systems  Constitutional: Negative.   HENT: Negative.   Eyes: Negative.   Respiratory: Negative.   Cardiovascular: Negative.   Gastrointestinal: Negative.   Endocrine: Negative.   Genitourinary: Negative.   Musculoskeletal: Negative.   Skin: Negative.   Allergic/Immunologic: Negative.   Neurological: Negative.   Hematological: Negative.   Psychiatric/Behavioral: Negative.     PHYSICAL EXAMINATION:    BP 116/72   Pulse 70   Resp 16   Wt 194 lb (88 kg)   LMP 02/04/2020   BMI 32.28 kg/m     General appearance: alert, cooperative and appears stated age Lymph:  no inguinal LAD noted  Pelvic: External genitalia:  no lesions              Urethra:  normal appearing urethra with no masses, tenderness or lesions              Bartholins and Skenes: normal                 Vagina: normal appearing vagina with normal color and whitish discharge noted, no lesions              Cervix: no lesions               Bimanual Exam:  Uterus:  normal size, contour, position, consistency, mobility, non-tender              Adnexa: no mass, fullness, tenderness  Chaperone, Terence Lux, CMA, was present for exam.  Assessment: Vaginal irritation/vulvar irritation H/o uterine fibroids Concerns about having cancer  Plan: BV and yeast nuswab testing obtained today Triamcinolone 0.5% ointment BID for no more than 3 days.  Can use no more than twice monthly.  Continue Vit E vaginal suppositories, twice weekly. Return for PUS

## 2020-02-27 ENCOUNTER — Encounter: Payer: Self-pay | Admitting: *Deleted

## 2020-02-27 NOTE — Progress Notes (Signed)
Patient called to ask whether or not she should have a MRI of her breast vs the diagnostic mammogram which is ordered and scheduled for this Friday. She has a history of dense breast tissue and breast ca.   Spoke with Dr Marin Olp. He states that we should proceed with the diagnostic mammogram and depending on results, a MRI may be needed for further assessment.   Relayed this information to the patient.   Oncology Nurse Navigator Documentation  Oncology Nurse Navigator Flowsheets 02/27/2020  Abnormal Finding Date -  Confirmed Diagnosis Date -  Diagnosis Status -  Planned Course of Treatment -  Phase of Treatment -  Hormonal Actual Start Date: -  Radiation Pending-Reason: -  Radiation Actual Start Date: -  Expected Surgery Date -  Surgery Actual Start Date: -  Navigator Follow Up Date: -  Navigator Follow Up Reason: -  Navigation Complete Date: -  Post Navigation: Continue to Follow Patient? -  Reason Not Navigating Patient: -  Production assistant, radio Encounter Type Telephone  Telephone Incoming Call  [facility] Date -  [facility] Type -  Treatment Initiated Date -  Patient Visit Type MedOnc  Treatment Phase Active Tx  Barriers/Navigation Needs Education  Education Other  Interventions Education;Psycho-Social Support  Acuity Level 1-No Barriers  Referrals -  Coordination of Care -  Education Method Verbal  Support Groups/Services Friends and Family  Time Spent with Patient 30  Genetic Counseling Type -  Genetic Counseling Date -

## 2020-02-28 ENCOUNTER — Ambulatory Visit (INDEPENDENT_AMBULATORY_CARE_PROVIDER_SITE_OTHER): Payer: BC Managed Care – PPO | Admitting: Obstetrics & Gynecology

## 2020-02-28 ENCOUNTER — Other Ambulatory Visit: Payer: Self-pay

## 2020-02-28 ENCOUNTER — Encounter: Payer: Self-pay | Admitting: Obstetrics & Gynecology

## 2020-02-28 VITALS — BP 116/72 | HR 70 | Resp 16 | Wt 194.0 lb

## 2020-02-28 DIAGNOSIS — D251 Intramural leiomyoma of uterus: Secondary | ICD-10-CM | POA: Diagnosis not present

## 2020-02-28 DIAGNOSIS — N898 Other specified noninflammatory disorders of vagina: Secondary | ICD-10-CM | POA: Diagnosis not present

## 2020-02-28 MED ORDER — TRIAMCINOLONE ACETONIDE 0.5 % EX OINT: 1.0000 "application " | TOPICAL_OINTMENT | Freq: Two times a day (BID) | CUTANEOUS | 0 refills | Status: DC

## 2020-02-29 ENCOUNTER — Ambulatory Visit
Admission: RE | Admit: 2020-02-29 | Discharge: 2020-02-29 | Disposition: A | Discharge status: Home / Self Care | Payer: BC Managed Care – PPO | Source: Ambulatory Visit | Attending: Hematology & Oncology | Admitting: Hematology & Oncology

## 2020-02-29 DIAGNOSIS — Z170 Estrogen receptor positive status [ER+]: Secondary | ICD-10-CM

## 2020-03-01 LAB — NUSWAB BV AND CANDIDA, NAA
Candida albicans, NAA: NEGATIVE
Candida glabrata, NAA: NEGATIVE

## 2020-03-05 ENCOUNTER — Telehealth: Payer: Self-pay

## 2020-03-05 ENCOUNTER — Other Ambulatory Visit: Payer: Self-pay | Admitting: Hematology & Oncology

## 2020-03-05 DIAGNOSIS — C50212 Malignant neoplasm of upper-inner quadrant of left female breast: Secondary | ICD-10-CM

## 2020-03-05 DIAGNOSIS — Z170 Estrogen receptor positive status [ER+]: Secondary | ICD-10-CM

## 2020-03-05 NOTE — Telephone Encounter (Signed)
Spoke with patient regarding benefits for recommended ultrasound. Patient is aware that ultrasound is transvaginal. Patient acknowledges understanding of information presented. Patient is aware of cancellation policy. Patient scheduled appointment for 03/13/2020 at 0230PM with M. Edwinna Areola, MD. Encounter closed.

## 2020-03-13 ENCOUNTER — Other Ambulatory Visit: Payer: Self-pay

## 2020-03-13 ENCOUNTER — Ambulatory Visit (INDEPENDENT_AMBULATORY_CARE_PROVIDER_SITE_OTHER): Payer: BC Managed Care – PPO

## 2020-03-13 ENCOUNTER — Encounter: Payer: Self-pay | Admitting: Obstetrics & Gynecology

## 2020-03-13 ENCOUNTER — Ambulatory Visit (INDEPENDENT_AMBULATORY_CARE_PROVIDER_SITE_OTHER): Payer: BC Managed Care – PPO | Admitting: Obstetrics & Gynecology

## 2020-03-13 VITALS — BP 116/70 | HR 68 | Resp 16 | Wt 194.0 lb

## 2020-03-13 DIAGNOSIS — D251 Intramural leiomyoma of uterus: Secondary | ICD-10-CM | POA: Diagnosis not present

## 2020-03-13 DIAGNOSIS — N852 Hypertrophy of uterus: Secondary | ICD-10-CM | POA: Diagnosis not present

## 2020-03-13 NOTE — Progress Notes (Signed)
54 y.o. G34P0030 Married [name] American female here for pelvic ultrasound due to fibroid uterus.  Pt has hx of [place] in 2017 and previously underwent myomectomy.  Uterus was 16cm prior to [place].  Cycles are becoming more irregular.  LMP 02/04/2020.  Pt does also have some anxiety about present of uterine fibroids and making sure there are no changes/abnormalities.  No LMP recorded. (Menstrual status: Irregular Periods).  Contraception: none  Findings:  UTERUS: 11.9 x 10.5 x 9.2cm with multiple fibroids, at least 13 noted today with largest measuring 6.0cm.  Fibroids are intramural, subserosal and pedunculated EMS: 6.25mm ADNEXA: Left ovary: surgically absent       Right ovary:  4.2 x 3.3 x 3.0cm  CUL DE SAC: no free fluid  Discussion:  Ultrasonographer supervised.  Images reviewed and findings discussed.  Compared prior imaging results to this and fibroids are smaller or stable.  Ovary appears normal.  Do not feel she needs surgical intervention at this time as she is basically asymptomatic and approaching menopause.  She see some additional decrease in size of uterus and fibroids when that occurs  Assessment:  Uterine fibroids, intramural, subserosal and pedunculated H/o LSO  Plan:  Repeat pelvic exam in about 1 year with routine gyn annual exam. Consider repeat PUS with any new changes/concerns.

## 2020-05-05 ENCOUNTER — Inpatient Hospital Stay: Payer: BC Managed Care – PPO | Attending: Genetic Counselor

## 2020-05-05 ENCOUNTER — Telehealth: Payer: Self-pay | Admitting: Hematology & Oncology

## 2020-05-05 ENCOUNTER — Encounter: Payer: Self-pay | Admitting: Hematology & Oncology

## 2020-05-05 ENCOUNTER — Other Ambulatory Visit: Payer: Self-pay

## 2020-05-05 ENCOUNTER — Inpatient Hospital Stay (HOSPITAL_BASED_OUTPATIENT_CLINIC_OR_DEPARTMENT_OTHER): Payer: BC Managed Care – PPO | Admitting: Hematology & Oncology

## 2020-05-05 VITALS — BP 128/67 | HR 73 | Temp 99.8°F | Resp 18 | Wt 201.0 lb

## 2020-05-05 DIAGNOSIS — C50212 Malignant neoplasm of upper-inner quadrant of left female breast: Secondary | ICD-10-CM

## 2020-05-05 DIAGNOSIS — Z923 Personal history of irradiation: Secondary | ICD-10-CM | POA: Insufficient documentation

## 2020-05-05 DIAGNOSIS — Z791 Long term (current) use of non-steroidal anti-inflammatories (NSAID): Secondary | ICD-10-CM | POA: Insufficient documentation

## 2020-05-05 DIAGNOSIS — N921 Excessive and frequent menstruation with irregular cycle: Secondary | ICD-10-CM | POA: Diagnosis not present

## 2020-05-05 DIAGNOSIS — D500 Iron deficiency anemia secondary to blood loss (chronic): Secondary | ICD-10-CM | POA: Diagnosis not present

## 2020-05-05 DIAGNOSIS — Z170 Estrogen receptor positive status [ER+]: Secondary | ICD-10-CM

## 2020-05-05 DIAGNOSIS — Z79810 Long term (current) use of selective estrogen receptor modulators (SERMs): Secondary | ICD-10-CM | POA: Diagnosis not present

## 2020-05-05 DIAGNOSIS — Z79899 Other long term (current) drug therapy: Secondary | ICD-10-CM | POA: Diagnosis not present

## 2020-05-05 DIAGNOSIS — N979 Female infertility, unspecified: Secondary | ICD-10-CM

## 2020-05-05 DIAGNOSIS — D0512 Intraductal carcinoma in situ of left breast: Secondary | ICD-10-CM | POA: Diagnosis present

## 2020-05-05 LAB — CMP (CANCER CENTER ONLY)
ALT: 23 U/L (ref 0–44)
AST: 21 U/L (ref 15–41)
Albumin: 3.7 g/dL (ref 3.5–5.0)
Alkaline Phosphatase: 47 U/L (ref 38–126)
Anion gap: 7 (ref 5–15)
BUN: 15 mg/dL (ref 6–20)
CO2: 27 mmol/L (ref 22–32)
Calcium: 9.5 mg/dL (ref 8.9–10.3)
Chloride: 104 mmol/L (ref 98–111)
Creatinine: 0.75 mg/dL (ref 0.44–1.00)
GFR, Estimated: >60 mL/min (ref >60)
Glucose, Bld: 105 mg/dL — ABNORMAL HIGH (ref 70–99)
Potassium: 3.6 mmol/L (ref 3.5–5.1)
Sodium: 138 mmol/L (ref 135–145)
Total Bilirubin: 0.3 mg/dL (ref 0.3–1.2)
Total Protein: 8 g/dL (ref 6.5–8.1)

## 2020-05-05 LAB — CBC WITH DIFFERENTIAL (CANCER CENTER ONLY)
Abs Immature Granulocytes: 0.08 10*3/uL — ABNORMAL HIGH (ref 0.00–0.07)
Basophils Absolute: 0 10*3/uL (ref 0.0–0.1)
Basophils Relative: 1 %
Eosinophils Absolute: 0.2 10*3/uL (ref 0.0–0.5)
Eosinophils Relative: 4 %
HCT: 38.2 % (ref 36.0–46.0)
Hemoglobin: 12.1 g/dL (ref 12.0–15.0)
Immature Granulocytes: 1 %
Lymphocytes Relative: 27 %
Lymphs Abs: 1.5 10*3/uL (ref 0.7–4.0)
MCH: 29.2 pg (ref 26.0–34.0)
MCHC: 31.7 g/dL (ref 30.0–36.0)
MCV: 92.3 fL (ref 80.0–100.0)
Monocytes Absolute: 0.5 10*3/uL (ref 0.1–1.0)
Monocytes Relative: 8 %
Neutro Abs: 3.3 10*3/uL (ref 1.7–7.7)
Neutrophils Relative %: 59 %
Platelet Count: 249 10*3/uL (ref 150–400)
RBC: 4.14 MIL/uL (ref 3.87–5.11)
RDW: 13.1 % (ref 11.5–15.5)
WBC Count: 5.6 10*3/uL (ref 4.0–10.5)
nRBC: 0 % (ref 0.0–0.2)

## 2020-05-05 NOTE — Telephone Encounter (Signed)
Appointments scheduled patient has My Chart access for updates per 12/20 los

## 2020-05-05 NOTE — Progress Notes (Signed)
Hematology and Oncology Follow Up Visit  [name] [id number] 1965-11-01 54 y.o. 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 daily-start on 07/08/2018  Radiation therapy -- completed 5200 rad - on 06/20/2019     Interim History:  [name] is back for follow-up.  She is doing quite well.  She will has had no specific complaint since we last saw her.  She had her birthday a couple weeks ago.  She enjoyed this.  She has had some hot flashes.  I am sure that the [name] is contributing to this.  She has had no problems with fatigue or weakness.  She has had no nausea or vomiting.  Is been no change in bowel or bladder habits.  She is on vacation right now.  She is going have a quiet week that she is looking forward to.  She has had no problems with headache.  There is no visual changes.  She has had no rashes.  Overall, her performance status is ECOG 1.    Medications:  Current Outpatient Medications:  .  amLODipine-valsartan (EXFORGE) 10-320 MG tablet, Take 1 tablet by mouth daily. , Disp: , Rfl:  .  benazepril-hydrochlorthiazide (LOTENSIN HCT) 10-12.5 MG tablet, , Disp: , Rfl:  .  Cholecalciferol (VITAMIN D3) 1.25 MG (50000 UT) CAPS, , Disp: , Rfl:  .  Dermatological Products, Misc. [facility]) lotion, EpiCeram topical emulsion, extended release  APPLY TO LEFT CHEST THROUGHOUT THE DAY, Disp: , Rfl:  .  Ferrous Sulfate 134 MG TABS, , Disp: , Rfl:  .  folic acid (FOLVITE) 1 MG tablet, Take 1 tablet (1 mg total) by mouth daily., Disp: 90 tablet, Rfl: 3 .  hydrochlorothiazide (HYDRODIURIL) 25 MG tablet, Take 25 mg by mouth daily. , Disp: , Rfl:  .  ibuprofen (ADVIL) 600 MG tablet, Take 600 mg by mouth every 6 (six) hours as needed., Disp: , Rfl:  .  ketoconazole (NIZORAL) 2 % cream, Apply  1 application topically daily as needed for irritation. , Disp: , Rfl:  .  Multiple Vitamins-Minerals (ZINC PO), Take by mouth., Disp: , Rfl:  .  NONFORMULARY OR COMPOUNDED ITEM, Vitamin E vaginal suppositories 200u/ml.  One pv three times weekly., Disp: 36 each, Rfl: 3 .  Omega-3 Fatty Acids (FISH OIL PO), Take by mouth daily., Disp: , Rfl:  .  Probiotic Product (PROBIOTIC PO), Take by mouth., Disp: , Rfl:  .  tazarotene (TAZORAC) 0.05 % cream, Apply 1 application topically at bedtime as needed (acne). , Disp: , Rfl:  .  Toremifene Citrate (FARESTON) 60 MG tablet, Take 1 tablet (60 mg total) by mouth daily., Disp: 90 tablet, Rfl: 3 .  triamcinolone ointment (KENALOG) 0.5 %, Apply 1 application topically 2 (two) times daily. Can use for up to 3 days with irritation.  Do not use more than twice monthly. (Patient not taking: Reported on 03/13/2020), Disp: 30 g, Rfl: 0  Allergies:  Allergies  Allergen Reactions  . Minocycline Hcl Hives and Itching  . Sulfa Antibiotics Swelling    Swell of the lips     Past Medical History, Surgical history, Social history, and Family History were reviewed and updated.  Review of Systems: Review of Systems  Constitutional: Negative.   HENT:  Negative.   Eyes: Negative.   Respiratory:  Negative.   Cardiovascular: Negative.   Gastrointestinal: Negative.   Endocrine: Negative.   Genitourinary: Negative.    Musculoskeletal: Negative.   Skin: Negative.   Neurological: Negative.   Hematological: Negative.   Psychiatric/Behavioral: Negative.     Physical Exam:  weight is 201 lb (91.2 kg). Her oral temperature is 99.8 F (37.7 C). Her blood pressure is 128/67 and her pulse is 73. Her respiration is 18 and oxygen saturation is 99%.   Wt Readings from Last 3 Encounters:  05/05/20 201 lb (91.2 kg)  03/13/20 194 lb (88 kg)  02/28/20 194 lb (88 kg)    Physical Exam Vitals reviewed.  Constitutional:      Comments: On her breast exam, right breast was  without masses, edema or erythema.  There is no right axillary adenopathy.  Left breast is quite hyperpigmented from radiation.  There is some healing areas of skin from the radiation.  There is some contraction from radiation and  surgery.  She has the lumpectomy scar on the areola at about the 11 o'clock position.  There is no erythema.  There is no fullness.  There is no distinct mass in the left breast.  There is no left axillary adenopathy.  HENT:     Head: Normocephalic and atraumatic.  Eyes:     Pupils: Pupils are equal, round, and reactive to light.  Cardiovascular:     Rate and Rhythm: Normal rate and regular rhythm.     Heart sounds: Normal heart sounds.  Pulmonary:     Effort: Pulmonary effort is normal.     Breath sounds: Normal breath sounds.  Abdominal:     General: Bowel sounds are normal.     Palpations: Abdomen is soft.  Musculoskeletal:        General: No tenderness or deformity. Normal range of motion.     Cervical back: Normal range of motion.  Lymphadenopathy:     Cervical: No cervical adenopathy.  Skin:    General: Skin is warm and dry.     Findings: No erythema or rash.  Neurological:     Mental Status: She is alert and oriented to person, place, and time.  Psychiatric:        Behavior: Behavior normal.        Thought Content: Thought content normal.        Judgment: Judgment normal.      Lab Results  Component Value Date   WBC 5.6 05/05/2020   HGB 12.1 05/05/2020   HCT 38.2 05/05/2020   MCV 92.3 05/05/2020   PLT 249 05/05/2020     Chemistry      Component Value Date/Time   NA 138 05/05/2020 0756   NA 142 04/12/2017 0858   K 3.6 05/05/2020 0756   K 3.8 04/12/2017 0858   CL 104 05/05/2020 0756   CL 104 04/12/2017 0858   CO2 27 05/05/2020 0756   CO2 26 04/12/2017 0858   BUN 15 05/05/2020 0756   BUN 9 04/12/2017 0858   CREATININE 0.75 05/05/2020 0756   CREATININE 0.8 04/12/2017 0858      Component Value Date/Time   CALCIUM 9.5 05/05/2020  0756   CALCIUM 9.2 04/12/2017 0858   ALKPHOS 47 05/05/2020 0756   ALKPHOS 74 04/12/2017 0858   AST 21 05/05/2020 0756   ALT 23 05/05/2020 0756   ALT 26 04/12/2017 0858   BILITOT 0.3 05/05/2020 0756      Impression and Plan: [name] is a 54 year old African-American female.  She now has ductal carcinoma in situ of the left breast with microinvasion.    Of note, she said that she does not have her monthly cycles any longer.  As such, I am sure that she is into the change of life.  Once we confirm that she is into the change of life, we can certainly change her out of the Fareston onto one of the new aromatase inhibitors.  We will plan to get her back in another 3 months.       [name], MD 12/20/20218:51 AM

## 2020-05-06 LAB — FOLLICLE STIMULATING HORMONE: FSH: 13 m[IU]/mL

## 2020-05-06 LAB — LUTEINIZING HORMONE: LH: 13.3 m[IU]/mL

## 2020-05-14 LAB — ESTRADIOL, ULTRA SENS: Estradiol, Sensitive: 111 pg/mL

## 2020-06-11 ENCOUNTER — Other Ambulatory Visit: Payer: Self-pay | Admitting: Hematology & Oncology

## 2020-07-04 ENCOUNTER — Other Ambulatory Visit: Payer: Self-pay | Admitting: *Deleted

## 2020-07-04 MED ORDER — TOREMIFENE CITRATE 60 MG PO TABS: 60.0000 mg | ORAL_TABLET | Freq: Every day | ORAL | 3 refills | Status: DC

## 2020-08-04 ENCOUNTER — Inpatient Hospital Stay: Payer: BC Managed Care – PPO | Attending: Genetic Counselor

## 2020-08-04 ENCOUNTER — Inpatient Hospital Stay (HOSPITAL_BASED_OUTPATIENT_CLINIC_OR_DEPARTMENT_OTHER): Payer: BC Managed Care – PPO | Admitting: Hematology & Oncology

## 2020-08-04 ENCOUNTER — Telehealth: Payer: Self-pay | Admitting: *Deleted

## 2020-08-04 ENCOUNTER — Encounter: Payer: Self-pay | Admitting: Hematology & Oncology

## 2020-08-04 ENCOUNTER — Other Ambulatory Visit: Payer: Self-pay

## 2020-08-04 VITALS — BP 110/70 | HR 74 | Temp 98.9°F | Resp 20 | Wt 204.1 lb

## 2020-08-04 DIAGNOSIS — N921 Excessive and frequent menstruation with irregular cycle: Secondary | ICD-10-CM | POA: Diagnosis not present

## 2020-08-04 DIAGNOSIS — C50212 Malignant neoplasm of upper-inner quadrant of left female breast: Secondary | ICD-10-CM

## 2020-08-04 DIAGNOSIS — Z79899 Other long term (current) drug therapy: Secondary | ICD-10-CM | POA: Diagnosis not present

## 2020-08-04 DIAGNOSIS — Z170 Estrogen receptor positive status [ER+]: Secondary | ICD-10-CM

## 2020-08-04 DIAGNOSIS — D500 Iron deficiency anemia secondary to blood loss (chronic): Secondary | ICD-10-CM | POA: Diagnosis not present

## 2020-08-04 DIAGNOSIS — Z923 Personal history of irradiation: Secondary | ICD-10-CM | POA: Diagnosis not present

## 2020-08-04 DIAGNOSIS — D0512 Intraductal carcinoma in situ of left breast: Secondary | ICD-10-CM | POA: Diagnosis present

## 2020-08-04 LAB — CBC WITH DIFFERENTIAL (CANCER CENTER ONLY)
Abs Immature Granulocytes: 0.09 10*3/uL — ABNORMAL HIGH (ref 0.00–0.07)
Basophils Absolute: 0 10*3/uL (ref 0.0–0.1)
Basophils Relative: 1 %
Eosinophils Absolute: 0.1 10*3/uL (ref 0.0–0.5)
Eosinophils Relative: 2 %
HCT: 37.5 % (ref 36.0–46.0)
Hemoglobin: 12 g/dL (ref 12.0–15.0)
Immature Granulocytes: 2 %
Lymphocytes Relative: 29 %
Lymphs Abs: 1.4 10*3/uL (ref 0.7–4.0)
MCH: 29.1 pg (ref 26.0–34.0)
MCHC: 32 g/dL (ref 30.0–36.0)
MCV: 90.8 fL (ref 80.0–100.0)
Monocytes Absolute: 0.4 10*3/uL (ref 0.1–1.0)
Monocytes Relative: 9 %
Neutro Abs: 2.9 10*3/uL (ref 1.7–7.7)
Neutrophils Relative %: 57 %
Platelet Count: 219 10*3/uL (ref 150–400)
RBC: 4.13 MIL/uL (ref 3.87–5.11)
RDW: 13.1 % (ref 11.5–15.5)
WBC Count: 4.9 10*3/uL (ref 4.0–10.5)
nRBC: 0 % (ref 0.0–0.2)

## 2020-08-04 LAB — FERRITIN: Ferritin: 46 ng/mL (ref 11–307)

## 2020-08-04 LAB — CMP (CANCER CENTER ONLY)
ALT: 23 U/L (ref 0–44)
AST: 20 U/L (ref 15–41)
Albumin: 3.8 g/dL (ref 3.5–5.0)
Alkaline Phosphatase: 55 U/L (ref 38–126)
Anion gap: 7 (ref 5–15)
BUN: 14 mg/dL (ref 6–20)
CO2: 29 mmol/L (ref 22–32)
Calcium: 9.3 mg/dL (ref 8.9–10.3)
Chloride: 103 mmol/L (ref 98–111)
Creatinine: 0.76 mg/dL (ref 0.44–1.00)
GFR, Estimated: >60 mL/min (ref >60)
Glucose, Bld: 114 mg/dL — ABNORMAL HIGH (ref 70–99)
Potassium: 3.7 mmol/L (ref 3.5–5.1)
Sodium: 139 mmol/L (ref 135–145)
Total Bilirubin: 0.4 mg/dL (ref 0.3–1.2)
Total Protein: 7.9 g/dL (ref 6.5–8.1)

## 2020-08-04 LAB — IRON AND TIBC
Iron: 84 ug/dL (ref 41–142)
Saturation Ratios: 34 % (ref 21–57)
TIBC: 249 ug/dL (ref 236–444)
UIBC: 165 ug/dL (ref 120–384)

## 2020-08-04 NOTE — Telephone Encounter (Signed)
Gave patient upcoming appointments - view mychart

## 2020-08-04 NOTE — Progress Notes (Signed)
Hematology and Oncology Follow Up Visit  [name] [id number] 09/25/1965 55 y.o. 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 daily-start on 07/08/2018  Radiation therapy -- completed 5200 rad - on 06/20/2019     Interim History:  Ms. Devall is back for follow-up.  She is doing quite well.  She is still taking the Fareston.  She still is premenopausal.  When we last checked her estradiol level back in December, it was 111.  Her LH and FSH are both normal.  She has had no problems with the Fareston.  She does have some hot flashes.  She has no bony pains or aches..  There is no nausea or vomiting.  She has had no change in bowel or bladder habits.  She has had no leg swelling.  She has had no headache.  She and her husband just got back from [place].  That a wonderful time down in [place].  Their head up to [place] for his birthday in a couple weeks.  She has had no fever.  She has had no problems with the coronavirus.  Overall, her performance status is ECOG 1.      Medications:  Current Outpatient Medications:  .  amLODipine-valsartan (EXFORGE) 10-320 MG tablet, Take 1 tablet by mouth daily. , Disp: , Rfl:  .  Cholecalciferol (VITAMIN D3) 1.25 MG (50000 UT) CAPS, daily., Disp: , Rfl:  .  Dermatological Products, Misc. [facility]) lotion, EpiCeram topical emulsion, extended release  APPLY TO LEFT CHEST THROUGHOUT THE DAY, Disp: , Rfl:  .  folic acid (FOLVITE) 1 MG tablet, Take 1 tablet (1 mg total) by mouth daily., Disp: 90 tablet, Rfl: 3 .  hydrochlorothiazide (HYDRODIURIL) 25 MG tablet, Take 25 mg by mouth daily. , Disp: , Rfl:  .  ibuprofen (ADVIL) 600 MG tablet, Take 600 mg by mouth every 6 (six) hours as needed., Disp: , Rfl:  .  ketoconazole (NIZORAL) 2 % cream, Apply 1  application topically daily as needed for irritation. , Disp: , Rfl:  .  Omega-3 Fatty Acids (FISH OIL PO), Take by mouth daily., Disp: , Rfl:  .  tazarotene (TAZORAC) 0.05 % cream, Apply 1 application topically at bedtime as needed (acne). , Disp: , Rfl:  .  Toremifene Citrate (FARESTON) 60 MG tablet, Take 1 tablet (60 mg total) by mouth daily., Disp: 90 tablet, Rfl: 3 .  triamcinolone ointment (KENALOG) 0.5 %, Apply 1 application topically 2 (two) times daily. Can use for up to 3 days with irritation.  Do not use more than twice monthly. (Patient taking differently: Apply 1 application topically 2 (two) times daily. Can use for up to 3 days with irritation.  Do not use more than twice monthly.), Disp: 30 g, Rfl: 0 .  vitamin C (ASCORBIC ACID) 500 MG tablet, Take 500 mg by mouth daily., Disp: , Rfl:  .  Zinc Sulfate (ZINC 15 PO), Take by mouth daily., Disp: , Rfl:   Allergies:  Allergies  Allergen Reactions  . Minocycline Hcl Hives and Itching  . Sulfa Antibiotics Swelling    Swell of the lips     Past Medical History, Surgical history, Social history, and Family History were reviewed and updated.  Review of Systems: Review of Systems  Constitutional: Negative.   HENT:  Negative.   Eyes: Negative.   Respiratory: Negative.   Cardiovascular: Negative.   Gastrointestinal: Negative.   Endocrine: Negative.   Genitourinary: Negative.    Musculoskeletal: Negative.   Skin: Negative.   Neurological: Negative.   Hematological: Negative.   Psychiatric/Behavioral: Negative.     Physical Exam:  weight is 204 lb 1.9 oz (92.6 kg). Her oral temperature is 98.9 F (37.2 C). Her blood pressure is 110/70 and her pulse is 74. Her respiration is 20 and oxygen saturation is 100%.   Wt Readings from Last 3 Encounters:  08/04/20 204 lb 1.9 oz (92.6 kg)  05/05/20 201 lb (91.2 kg)  03/13/20 194 lb (88 kg)    Physical Exam Vitals reviewed.  Constitutional:      Comments: On her breast exam,  right breast was without masses, edema or erythema.  There is no right axillary adenopathy.  Left breast is quite hyperpigmented from radiation.  There is some healing areas of skin from the radiation.  There is some contraction from radiation and  surgery.  She has the lumpectomy scar on the areola at about the 11 o'clock position.  There is no erythema.  There is no fullness.  There is no distinct mass in the left breast.  There is no left axillary adenopathy.  HENT:     Head: Normocephalic and atraumatic.  Eyes:     Pupils: Pupils are equal, round, and reactive to light.  Cardiovascular:     Rate and Rhythm: Normal rate and regular rhythm.     Heart sounds: Normal heart sounds.  Pulmonary:     Effort: Pulmonary effort is normal.     Breath sounds: Normal breath sounds.  Abdominal:     General: Bowel sounds are normal.     Palpations: Abdomen is soft.  Musculoskeletal:        General: No tenderness or deformity. Normal range of motion.     Cervical back: Normal range of motion.  Lymphadenopathy:     Cervical: No cervical adenopathy.  Skin:    General: Skin is warm and dry.     Findings: No erythema or rash.  Neurological:     Mental Status: She is alert and oriented to person, place, and time.  Psychiatric:        Behavior: Behavior normal.        Thought Content: Thought content normal.        Judgment: Judgment normal.      Lab Results  Component Value Date   WBC 4.9 08/04/2020   HGB 12.0 08/04/2020   HCT 37.5 08/04/2020   MCV 90.8 08/04/2020   PLT 219 08/04/2020     Chemistry      Component Value Date/Time   NA 138 05/05/2020 0756   NA 142 04/12/2017 0858   K 3.6 05/05/2020 0756   K 3.8 04/12/2017 0858   CL 104 05/05/2020 0756   CL 104 04/12/2017 0858   CO2 27 05/05/2020 0756   CO2 26 04/12/2017 0858   BUN 15 05/05/2020 0756   BUN 9 04/12/2017 0858   CREATININE 0.75 05/05/2020 0756   CREATININE 0.8 04/12/2017 0858      Component Value Date/Time   CALCIUM  9.5 05/05/2020 0756   CALCIUM 9.2 04/12/2017 0858   ALKPHOS 47 05/05/2020 0756   ALKPHOS 74 04/12/2017 0858   AST 21 05/05/2020 0756   ALT 23 05/05/2020 0756   ALT 26 04/12/2017 0858   BILITOT 0.3 05/05/2020 0756  Impression and Plan: Ms. Messerschmidt is a 55 year old African-American female.  She now has ductal carcinoma in situ of the left breast with microinvasion.    We still cannot change over to an aromatase inhibitor because she is still premenopausal.  We will plan to get her back in another 3 months.  I think she says she is due for MRI coming up.       [name], MD 3/21/20228:28 AM

## 2020-08-05 LAB — FOLLICLE STIMULATING HORMONE: FSH: 17 m[IU]/mL

## 2020-08-05 LAB — LUTEINIZING HORMONE: LH: 10.4 m[IU]/mL

## 2020-08-08 ENCOUNTER — Encounter (HOSPITAL_BASED_OUTPATIENT_CLINIC_OR_DEPARTMENT_OTHER): Payer: Self-pay

## 2020-08-12 LAB — ESTRADIOL, ULTRA SENS: Estradiol, Sensitive: 12.7 pg/mL

## 2020-08-18 ENCOUNTER — Ambulatory Visit: Payer: BC Managed Care – PPO

## 2020-08-31 ENCOUNTER — Other Ambulatory Visit: Payer: Self-pay | Admitting: Hematology & Oncology

## 2020-09-01 ENCOUNTER — Other Ambulatory Visit: Payer: Self-pay

## 2020-09-01 ENCOUNTER — Ambulatory Visit
Admission: RE | Admit: 2020-09-01 | Discharge: 2020-09-01 | Disposition: A | Discharge status: Home / Self Care | Payer: BC Managed Care – PPO | Source: Ambulatory Visit | Attending: Hematology & Oncology | Admitting: Hematology & Oncology

## 2020-09-01 DIAGNOSIS — C50212 Malignant neoplasm of upper-inner quadrant of left female breast: Secondary | ICD-10-CM

## 2020-09-01 MED ORDER — GADOBUTROL 1 MMOL/ML IV SOLN
9.0000 mL | Freq: Once | INTRAVENOUS | Status: AC | PRN
  Administered 2020-09-01: 9 mL via INTRAVENOUS

## 2020-09-02 ENCOUNTER — Telehealth: Payer: Self-pay | Admitting: *Deleted

## 2020-09-02 NOTE — Telephone Encounter (Signed)
Notified pt of results. No concerns at this time. 

## 2020-09-02 NOTE — Telephone Encounter (Signed)
-----   Message from [name], MD sent at 09/02/2020 10:47 AM EDT ----- 9 please call and tell her that the MRI looks fantastic.  No evidence of any malignancy in the breast.  Thanks.  Laurey Arrow

## 2020-09-03 ENCOUNTER — Ambulatory Visit (HOSPITAL_BASED_OUTPATIENT_CLINIC_OR_DEPARTMENT_OTHER): Payer: BC Managed Care – PPO | Admitting: Obstetrics & Gynecology

## 2020-09-12 ENCOUNTER — Ambulatory Visit (HOSPITAL_BASED_OUTPATIENT_CLINIC_OR_DEPARTMENT_OTHER): Payer: BC Managed Care – PPO | Admitting: Obstetrics & Gynecology

## 2020-10-17 ENCOUNTER — Ambulatory Visit (HOSPITAL_BASED_OUTPATIENT_CLINIC_OR_DEPARTMENT_OTHER): Payer: BC Managed Care – PPO | Admitting: Obstetrics & Gynecology

## 2020-11-03 ENCOUNTER — Telehealth: Payer: Self-pay | Admitting: *Deleted

## 2020-11-03 ENCOUNTER — Inpatient Hospital Stay: Payer: BC Managed Care – PPO | Attending: Hematology & Oncology

## 2020-11-03 ENCOUNTER — Other Ambulatory Visit: Payer: Self-pay

## 2020-11-03 ENCOUNTER — Inpatient Hospital Stay (HOSPITAL_BASED_OUTPATIENT_CLINIC_OR_DEPARTMENT_OTHER): Payer: BC Managed Care – PPO | Admitting: Hematology & Oncology

## 2020-11-03 ENCOUNTER — Encounter: Payer: Self-pay | Admitting: Hematology & Oncology

## 2020-11-03 VITALS — BP 123/79 | HR 69 | Temp 98.9°F | Resp 18 | Wt 205.4 lb

## 2020-11-03 DIAGNOSIS — N921 Excessive and frequent menstruation with irregular cycle: Secondary | ICD-10-CM | POA: Insufficient documentation

## 2020-11-03 DIAGNOSIS — D0512 Intraductal carcinoma in situ of left breast: Secondary | ICD-10-CM | POA: Insufficient documentation

## 2020-11-03 DIAGNOSIS — D500 Iron deficiency anemia secondary to blood loss (chronic): Secondary | ICD-10-CM | POA: Insufficient documentation

## 2020-11-03 DIAGNOSIS — C50212 Malignant neoplasm of upper-inner quadrant of left female breast: Secondary | ICD-10-CM

## 2020-11-03 DIAGNOSIS — Z79899 Other long term (current) drug therapy: Secondary | ICD-10-CM | POA: Insufficient documentation

## 2020-11-03 DIAGNOSIS — Z79811 Long term (current) use of aromatase inhibitors: Secondary | ICD-10-CM | POA: Diagnosis not present

## 2020-11-03 DIAGNOSIS — Z923 Personal history of irradiation: Secondary | ICD-10-CM | POA: Insufficient documentation

## 2020-11-03 DIAGNOSIS — Z170 Estrogen receptor positive status [ER+]: Secondary | ICD-10-CM | POA: Diagnosis not present

## 2020-11-03 LAB — CBC WITH DIFFERENTIAL (CANCER CENTER ONLY)
Abs Immature Granulocytes: 0.06 10*3/uL (ref 0.00–0.07)
Basophils Absolute: 0 10*3/uL (ref 0.0–0.1)
Basophils Relative: 0 %
Eosinophils Absolute: 0.1 10*3/uL (ref 0.0–0.5)
Eosinophils Relative: 2 %
HCT: 35.9 % — ABNORMAL LOW (ref 36.0–46.0)
Hemoglobin: 11.5 g/dL — ABNORMAL LOW (ref 12.0–15.0)
Immature Granulocytes: 1 %
Lymphocytes Relative: 30 %
Lymphs Abs: 1.4 10*3/uL (ref 0.7–4.0)
MCH: 29.3 pg (ref 26.0–34.0)
MCHC: 32 g/dL (ref 30.0–36.0)
MCV: 91.6 fL (ref 80.0–100.0)
Monocytes Absolute: 0.4 10*3/uL (ref 0.1–1.0)
Monocytes Relative: 9 %
Neutro Abs: 2.7 10*3/uL (ref 1.7–7.7)
Neutrophils Relative %: 58 %
Platelet Count: 224 10*3/uL (ref 150–400)
RBC: 3.92 MIL/uL (ref 3.87–5.11)
RDW: 13.2 % (ref 11.5–15.5)
WBC Count: 4.8 10*3/uL (ref 4.0–10.5)
nRBC: 0 % (ref 0.0–0.2)

## 2020-11-03 LAB — CMP (CANCER CENTER ONLY)
ALT: 21 U/L (ref 0–44)
AST: 18 U/L (ref 15–41)
Albumin: 3.6 g/dL (ref 3.5–5.0)
Alkaline Phosphatase: 45 U/L (ref 38–126)
Anion gap: 7 (ref 5–15)
BUN: 16 mg/dL (ref 6–20)
CO2: 28 mmol/L (ref 22–32)
Calcium: 9.3 mg/dL (ref 8.9–10.3)
Chloride: 104 mmol/L (ref 98–111)
Creatinine: 0.73 mg/dL (ref 0.44–1.00)
GFR, Estimated: >60 mL/min (ref >60)
Glucose, Bld: 121 mg/dL — ABNORMAL HIGH (ref 70–99)
Potassium: 3.5 mmol/L (ref 3.5–5.1)
Sodium: 139 mmol/L (ref 135–145)
Total Bilirubin: 0.4 mg/dL (ref 0.3–1.2)
Total Protein: 7.5 g/dL (ref 6.5–8.1)

## 2020-11-03 NOTE — Telephone Encounter (Signed)
Per 11/03/20 los gave patient upcoming appointment - view mychart 

## 2020-11-03 NOTE — Progress Notes (Signed)
Hematology and Oncology Follow Up Visit  [name] [id number] 27-May-1965 55 y.o. 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 daily-start on 07/08/2018 Radiation therapy -- completed 5200 rad - on 06/20/2019     Interim History:  [name] is back for follow-up.  She looks great.  She feels good.  She comes in with her husband.  As always, it is a lot of fun to talk to both of them.  Though will be going up to [facility] for July 4 weekend.  It sounds like he will be heading down to [place] for the holidays in November and December.  She is doing well with the Fareston.  So far, she has had no issues with Fareston.  She has had no headache.  There is been no rashes.  She has had no change in bowel or bladder habits.  She has had no bony pain.  There is been no bleeding.  Her last hormonal studies still showed an elevated estradiol.  As such, we really are not able to switch her over.  She had MRI of the breast on 09/01/2020.  Everything looked fine with respect to the breast.  There is no evidence of any malignancy.  Overall, I would say her performance status is ECOG 1.   Medications:  Current Outpatient Medications:    amLODipine-valsartan (EXFORGE) 10-320 MG tablet, Take 1 tablet by mouth daily. , Disp: , Rfl:    Cholecalciferol (VITAMIN D3) 1.25 MG (50000 UT) CAPS, daily., Disp: , Rfl:    Dermatological Products, Misc. (EPICERAM) lotion, EpiCeram topical emulsion, extended release  APPLY TO LEFT CHEST THROUGHOUT THE DAY, Disp: , Rfl:    folic acid (FOLVITE) 1 MG tablet, TAKE 1 TABLET BY MOUTH EVERY DAY, Disp: 90 tablet, Rfl: 3   hydrochlorothiazide (HYDRODIURIL) 25 MG tablet, Take 25 mg by mouth daily. , Disp: , Rfl:    ibuprofen (ADVIL) 600 MG tablet, Take 600 mg by mouth every 6 (six)  hours as needed., Disp: , Rfl:    ketoconazole (NIZORAL) 2 % cream, Apply 1 application topically daily as needed for irritation. , Disp: , Rfl:    Omega-3 Fatty Acids (FISH OIL PO), Take by mouth daily., Disp: , Rfl:    tazarotene (TAZORAC) 0.05 % cream, Apply 1 application topically at bedtime as needed (acne). , Disp: , Rfl:    Toremifene Citrate (FARESTON) 60 MG tablet, Take 1 tablet (60 mg total) by mouth daily., Disp: 90 tablet, Rfl: 3   triamcinolone ointment (KENALOG) 0.5 %, Apply 1 application topically 2 (two) times daily. Can use for up to 3 days with irritation.  Do not use more than twice monthly. (Patient taking differently: Apply 1 application topically 2 (two) times daily. Can use for up to 3 days with irritation.  Do not use more than twice monthly.), Disp: 30 g, Rfl: 0   vitamin C (ASCORBIC ACID) 500 MG tablet, Take 500 mg by mouth daily., Disp: , Rfl:    Zinc Sulfate (ZINC 15 PO), Take by mouth daily., Disp: , Rfl:   Allergies:  Allergies  Allergen Reactions   Minocycline Hcl Hives and Itching   Sulfa Antibiotics Swelling    Swell of the lips     Past Medical History, Surgical history, Social history, and Family History were reviewed and updated.  Review of Systems: Review of Systems  Constitutional: Negative.   HENT:  Negative.    Eyes: Negative.   Respiratory: Negative.    Cardiovascular: Negative.   Gastrointestinal: Negative.   Endocrine: Negative.   Genitourinary: Negative.    Musculoskeletal: Negative.   Skin: Negative.   Neurological: Negative.   Hematological: Negative.   Psychiatric/Behavioral: Negative.     Physical Exam:  weight is 205 lb 6.4 oz (93.2 kg). Her oral temperature is 98.9 F (37.2 C). Her blood pressure is 123/79 and her pulse is 69. Her respiration is 18 and oxygen saturation is 100%.   Wt Readings from Last 3 Encounters:  11/03/20 205 lb 6.4 oz (93.2 kg)  08/04/20 204 lb 1.9 oz (92.6 kg)  05/05/20 201 lb (91.2 kg)    Physical  Exam Vitals reviewed.  Constitutional:      Comments: On her breast exam, right breast was without masses, edema or erythema.  There is no right axillary adenopathy.  Left breast is quite hyperpigmented from radiation.  There is some healing areas of skin from the radiation.  There is some contraction from radiation and  surgery.  She has the lumpectomy scar on the areola at about the 11 o'clock position.  There is no erythema.  There is no fullness.  There is no distinct mass in the left breast.  There is no left axillary adenopathy.  HENT:     Head: Normocephalic and atraumatic.  Eyes:     Pupils: Pupils are equal, round, and reactive to light.  Cardiovascular:     Rate and Rhythm: Normal rate and regular rhythm.     Heart sounds: Normal heart sounds.  Pulmonary:     Effort: Pulmonary effort is normal.     Breath sounds: Normal breath sounds.  Abdominal:     General: Bowel sounds are normal.     Palpations: Abdomen is soft.  Musculoskeletal:        General: No tenderness or deformity. Normal range of motion.     Cervical back: Normal range of motion.  Lymphadenopathy:     Cervical: No cervical adenopathy.  Skin:    General: Skin is warm and dry.     Findings: No erythema or rash.  Neurological:     Mental Status: She is alert and oriented to person, place, and time.  Psychiatric:        Behavior: Behavior normal.        Thought Content: Thought content normal.        Judgment: Judgment normal.     Lab Results  Component Value Date   WBC 4.8 11/03/2020   HGB 11.5 (L) 11/03/2020   HCT 35.9 (L) 11/03/2020   MCV 91.6 11/03/2020   PLT 224 11/03/2020     Chemistry      Component Value Date/Time   NA 139 11/03/2020 0803   NA 142 04/12/2017 0858   K 3.5 11/03/2020 0803   K 3.8 04/12/2017 0858   CL 104 11/03/2020 0803   CL 104 04/12/2017 0858   CO2 28 11/03/2020 0803   CO2 26 04/12/2017 0858   BUN 16 11/03/2020 0803   BUN 9 04/12/2017 0858   CREATININE 0.73 11/03/2020  0803   CREATININE 0.8 04/12/2017 0858      Component Value Date/Time   CALCIUM 9.3 11/03/2020 0803   CALCIUM 9.2 04/12/2017 0858   ALKPHOS 45 11/03/2020 0803   ALKPHOS 74 04/12/2017 0858   AST 18 11/03/2020 0803   ALT  21 11/03/2020 0803   ALT 26 04/12/2017 0858   BILITOT 0.4 11/03/2020 0803      Impression and Plan: [name] is a 55 year old African-American female.  She now has ductal carcinoma in situ of the left breast with microinvasion.    We still cannot change over to an aromatase inhibitor because she is still premenopausal.  We will plan to get her back in another 3 months.       [name], MD 6/20/20228:48 AM

## 2020-11-04 LAB — FOLLICLE STIMULATING HORMONE: FSH: 16.1 m[IU]/mL

## 2020-11-04 LAB — LUTEINIZING HORMONE: LH: 10.2 m[IU]/mL

## 2020-11-04 IMAGING — MG DIGITAL DIAGNOSTIC BILAT W/ TOMO W/ CAD
6 of 9 series · 6 of 25 positions shown · non-contrast
Comparison: Previous exam(s).

CLINICAL DATA: 53-year-old who underwent malignant lumpectomy of
the UPPER INNER QUADRANT of the LEFT breast in March 2019, with
adjuvant radiation therapy. This is the patient's initial post
lumpectomy annual mammogram.

EXAM:
DIGITAL DIAGNOSTIC BILATERAL MAMMOGRAM WITH TOMO AND CAD

[L CC]
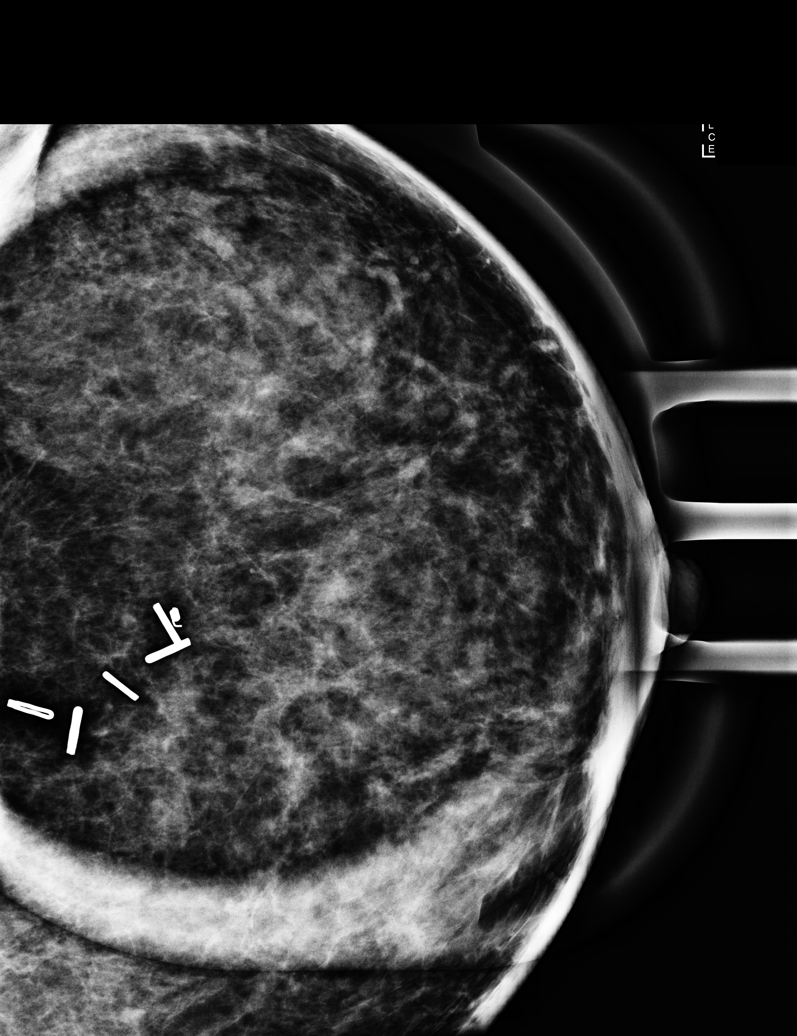

[L MLO synth-2D]
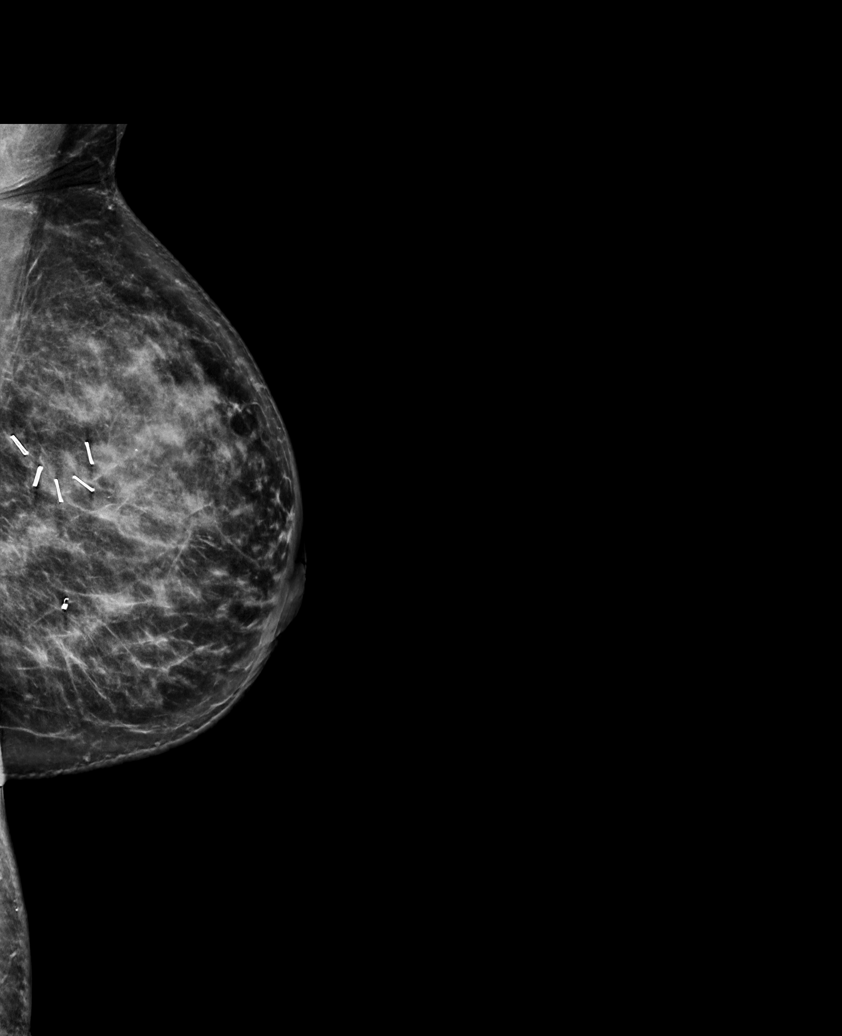

[R CC synth-2D]
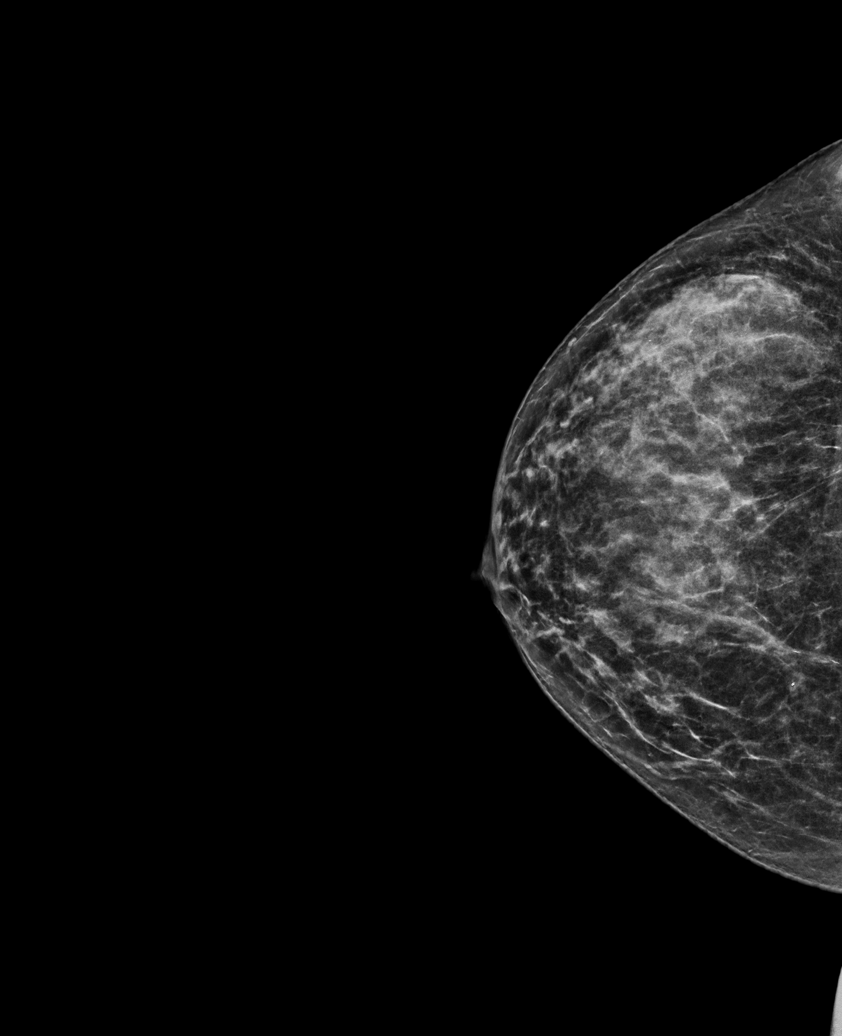

[R MLO synth-2D]
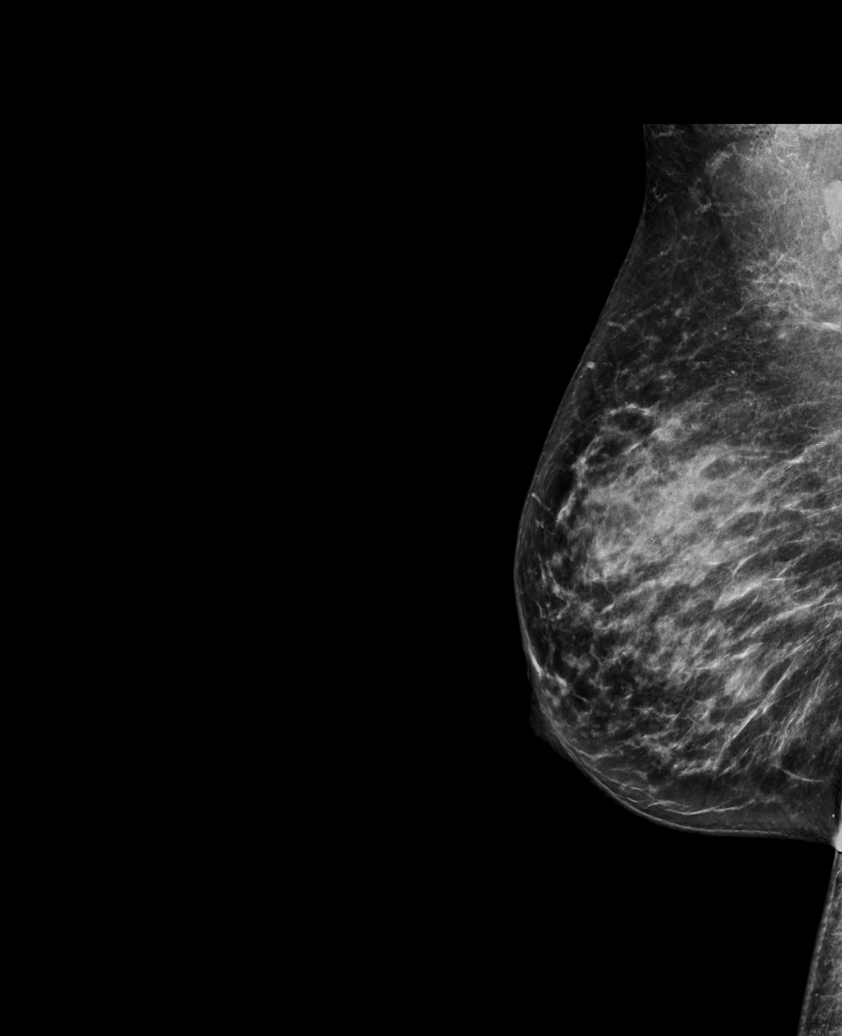

[L CC synth-2D]
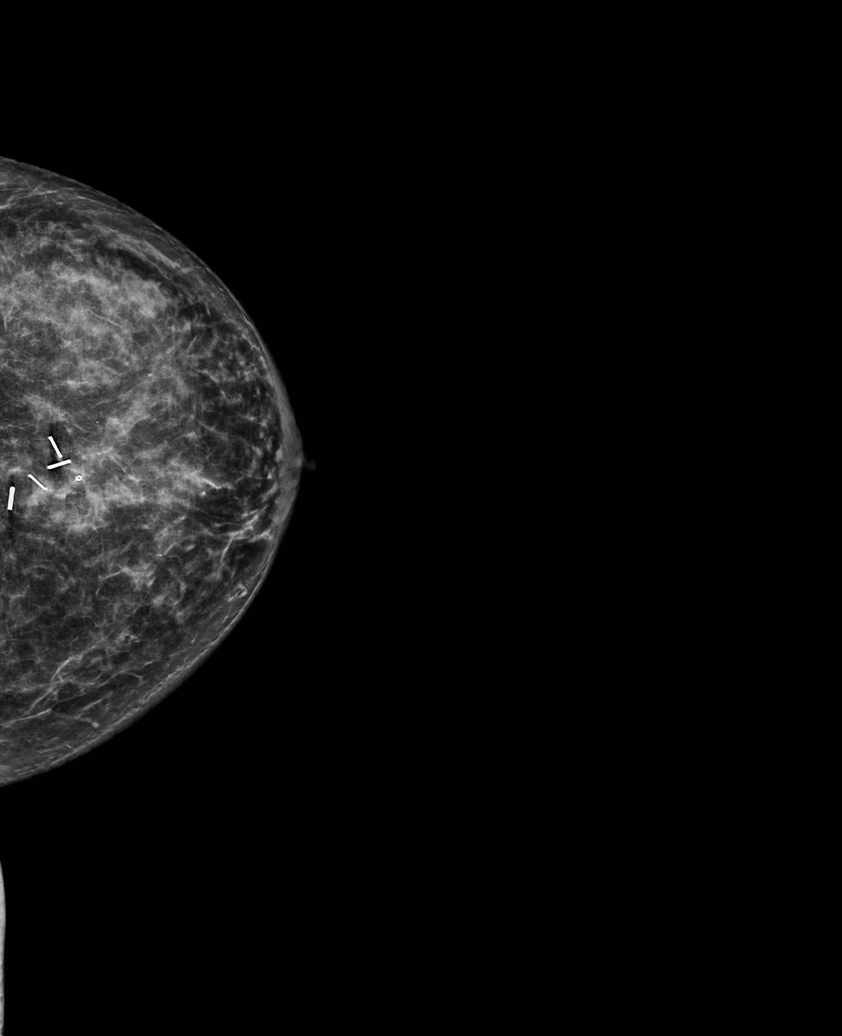

[R CC tomo · tomo slice 31/62.0]
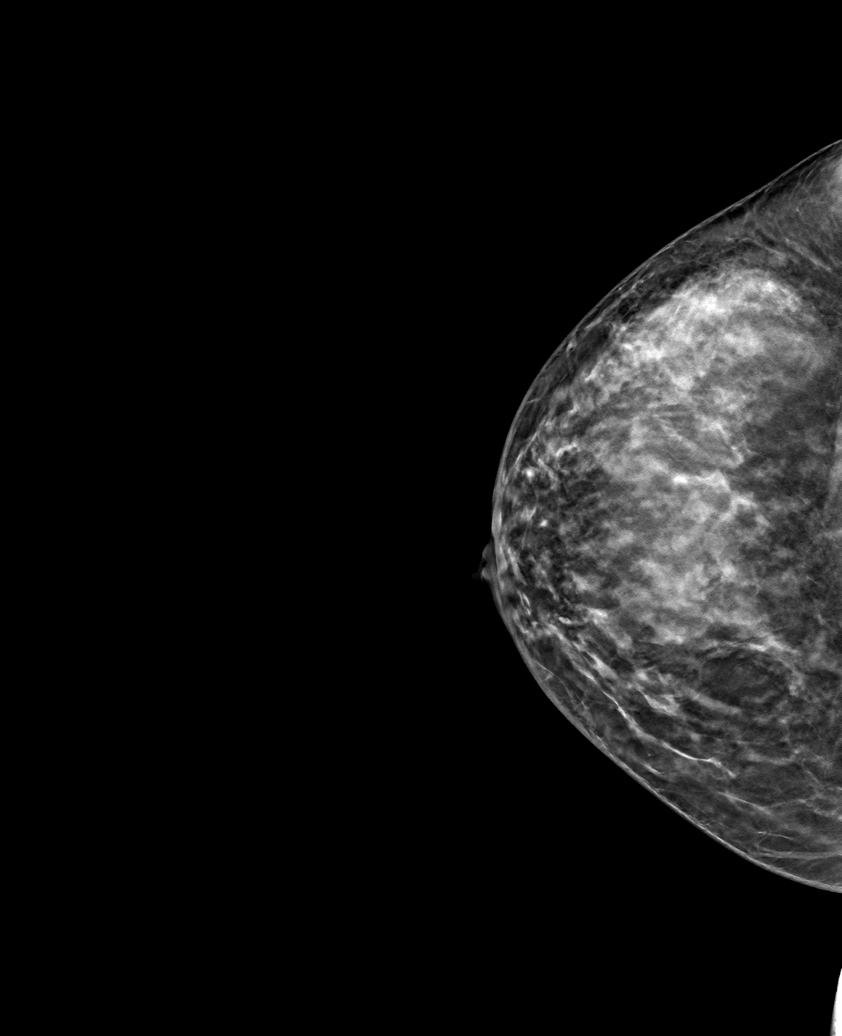

[6 of 25 positions shown; findings below may reference images not displayed]

ACR Breast Density Category d: The breast tissue is extremely dense,
which lowers the sensitivity of mammography.
FINDINGS: Tomosynthesis and synthesized full field CC and MLO views of both
breasts were obtained. Standard spot magnification CC view of the
lumpectomy site in the LEFT breast was also obtained.

Minimal post surgical scar/architectural distortion at the
lumpectomy site in the UPPER INNER LEFT breast at POSTERIOR depth.
Residual mild post radiation skin thickening and trabecular
thickening involving the LEFT breast. No findings suspicious for
malignancy in the LEFT breast.

No findings suspicious for malignancy in the RIGHT breast.

Mammographic images were processed with CAD.
IMPRESSION: 1. No mammographic evidence of malignancy involving either breast.
2. Expected post lumpectomy and post radiation changes involving the
LEFT breast.

RECOMMENDATION:
1. Annual BILATERAL diagnostic mammography in 1 year.
2. The patient asked about the need for supplemental Breast MRI,
given her extreme breast density on mammography. I counseled her to
discuss this with Dr. Willis and Dr. Popovich. If Breast MRI is
performed, it should be performed 6 months after the mammogram.

I have discussed the findings and recommendations with the patient.
If applicable, a reminder letter will be sent to the patient
regarding the next appointment.

BI-RADS CATEGORY  2: Benign.

## 2020-11-09 LAB — ESTRADIOL, ULTRA SENS: Estradiol, Sensitive: 11.9 pg/mL

## 2021-01-01 ENCOUNTER — Encounter (HOSPITAL_BASED_OUTPATIENT_CLINIC_OR_DEPARTMENT_OTHER): Payer: Self-pay | Admitting: Obstetrics & Gynecology

## 2021-01-01 ENCOUNTER — Other Ambulatory Visit: Payer: Self-pay

## 2021-01-01 ENCOUNTER — Ambulatory Visit (INDEPENDENT_AMBULATORY_CARE_PROVIDER_SITE_OTHER): Payer: BC Managed Care – PPO | Admitting: Obstetrics & Gynecology

## 2021-01-01 ENCOUNTER — Other Ambulatory Visit (HOSPITAL_COMMUNITY)
Admission: RE | Admit: 2021-01-01 | Discharge: 2021-01-01 | Disposition: A | Discharge status: Home / Self Care | Payer: BC Managed Care – PPO | Source: Ambulatory Visit | Attending: Obstetrics & Gynecology | Admitting: Obstetrics & Gynecology

## 2021-01-01 VITALS — BP 148/81 | HR 70 | Ht 65.0 in | Wt 203.2 lb

## 2021-01-01 DIAGNOSIS — N898 Other specified noninflammatory disorders of vagina: Secondary | ICD-10-CM | POA: Diagnosis not present

## 2021-01-01 DIAGNOSIS — Z170 Estrogen receptor positive status [ER+]: Secondary | ICD-10-CM

## 2021-01-01 DIAGNOSIS — Z124 Encounter for screening for malignant neoplasm of cervix: Secondary | ICD-10-CM

## 2021-01-01 DIAGNOSIS — C50212 Malignant neoplasm of upper-inner quadrant of left female breast: Secondary | ICD-10-CM

## 2021-01-01 DIAGNOSIS — Z01419 Encounter for gynecological examination (general) (routine) without abnormal findings: Secondary | ICD-10-CM | POA: Diagnosis not present

## 2021-01-01 DIAGNOSIS — D250 Submucous leiomyoma of uterus: Secondary | ICD-10-CM

## 2021-01-01 DIAGNOSIS — D251 Intramural leiomyoma of uterus: Secondary | ICD-10-CM

## 2021-01-01 DIAGNOSIS — Z8742 Personal history of other diseases of the female genital tract: Secondary | ICD-10-CM

## 2021-01-01 DIAGNOSIS — Z8619 Personal history of other infectious and parasitic diseases: Secondary | ICD-10-CM

## 2021-01-01 NOTE — Progress Notes (Signed)
55 y.o. G51P0030 Married [place] American female here for annual exam.  Cycles are irregular.  Last one was May.  Has been mostly having cycles every other month.  Flow has been normal when has cycle.  Overall, flow is less and much shorter.    Dr. Marin Olp is following her hormonal levels.  Once menopausal, therapy recommendations will likely change.  Had genetic testing 2020.  This was negative.    Continues to have vaginal moisture.  Had several tests done last year.  Reviewed with pt this is common with SERM.    Patient's last menstrual period was 10/06/2020 (approximate).          Sexually active: Yes.    The current method of family planning is none.    Exercising: Yes.     walking  at least 4 miles every Saturday. Smoker:  no  Health Maintenance: Pap:  08/03/2019 Negative.  Desires yearly pap smear.   History of abnormal Pap:  yes MMG:  02/2020 and breast MRI 08/2020.  Seroma noted with MRI. Colonoscopy:  07/02/2019, follow up 10 years BMD:   not indicated TDaP:  10/2012 Shingrix:   discussed today Hep C testing: 2016 Screening Labs: reviewed with pt.  Last lipid result was 2018 in Epic.   reports that she has never smoked. She has never used smokeless tobacco. She reports current alcohol use. She reports that she does not use drugs.  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 [facility])     Past Surgical History:  Procedure Laterality Date   BREAST LUMPECTOMY Left 03/20/2019   BREAST  LUMPECTOMY WITH RADIOACTIVE SEED LOCALIZATION Left 03/20/2019   Procedure: LEFT BREAST LUMPECTOMY WITH RADIOACTIVE SEED LOCALIZATION;  Surgeon: Coralie Keens, MD;  Location: [place];  Service: General;  Laterality: Left;   ESOPHAGOGASTRODUODENOSCOPY  2020   IR GENERIC HISTORICAL  03/31/2016   IR RADIOLOGIST EVAL & MGMT 03/31/2016 Marybelle Killings, MD [facility] INTERV RAD   IR GENERIC HISTORICAL  01/06/2016   IR RADIOLOGIST EVAL & MGMT 01/06/2016 Marybelle Killings, MD [facility] INTERV RAD   left finger reattachment  age 11   [place]  2003   MYOMECTOMY  2008   via laparoscopy   RE-EXCISION OF BREAST CANCER,SUPERIOR MARGINS Left 04/06/2019   Procedure: RE-EXCISION OF LEFT BREAST CANCER MARGINS;  Surgeon: Coralie Keens, MD;  Location: [place];  Service: General;  Laterality: Left;    Current Outpatient Medications  Medication Sig Dispense Refill   amLODipine-valsartan (EXFORGE) 10-320 MG tablet Take 1 tablet by mouth daily.      Cholecalciferol (VITAMIN D3) 1.25 MG (50000 UT) CAPS daily.     Dermatological Products, Misc. [facility]) lotion EpiCeram topical emulsion, extended release  APPLY TO LEFT CHEST THROUGHOUT THE DAY     folic acid (FOLVITE) 1 MG tablet TAKE 1 TABLET BY MOUTH EVERY DAY  90 tablet 3   hydrochlorothiazide (HYDRODIURIL) 25 MG tablet Take 25 mg by mouth daily.      ibuprofen (ADVIL) 600 MG tablet Take 600 mg by mouth every 6 (six) hours as needed.     Omega-3 Fatty Acids (FISH OIL PO) Take by mouth daily.     tazarotene (TAZORAC) 0.05 % cream Apply 1 application topically at bedtime as needed (acne).      Toremifene Citrate (FARESTON) 60 MG tablet Take 1 tablet (60 mg total) by mouth daily. 90 tablet 3   triamcinolone ointment (KENALOG) 0.5 % Apply 1 application topically 2 (two) times daily. Can use for up to 3 days with irritation.  Do not use more than twice monthly. (Patient taking differently: Apply 1 application topically 2 (two) times daily. Can  use for up to 3 days with irritation.  Do not use more than twice monthly.) 30 g 0   Turmeric (QC TUMERIC COMPLEX PO) Take by mouth.     Zinc Sulfate (ZINC 15 PO) Take by mouth daily.     ketoconazole (NIZORAL) 2 % cream Apply 1 application topically daily as needed for irritation.  (Patient not taking: Reported on 01/01/2021)     vitamin C (ASCORBIC ACID) 500 MG tablet Take 500 mg by mouth daily. (Patient not taking: Reported on 01/01/2021)     No current facility-administered medications for this visit.    Family History  Problem Relation Age of Onset   Diabetes Mother    Hypertension Mother    Heart disease Mother        cardiomegaly   Lupus Mother    Diabetes Father    Hypertension Father    Stroke Father    Hypertension Maternal Grandmother    Stroke Maternal Grandmother    Hypertension Sister    Seizures Sister    COPD Sister    Ovarian cancer Paternal Aunt     Review of Systems  All other systems reviewed and are negative.  Exam:   BP (!) 148/81 (BP Location: Right Arm, Patient Position: Sitting, Cuff Size: Large)   Pulse 70   Ht '5\' 5"'$  (1.651 m)   Wt 203 lb 3.2 oz (92.2 kg)   LMP 10/06/2020 (Approximate)   BMI 33.81 kg/m   Height: '5\' 5"'$  (165.1 cm)  General appearance: alert, cooperative and appears stated age Head: Normocephalic, without obvious abnormality, atraumatic Neck: no adenopathy, supple, symmetrical, trachea midline and thyroid normal to inspection and palpation Lungs: clear to auscultation bilaterally Breasts: normal appearance, no masses or tenderness, well healed scars Heart: regular rate and rhythm Abdomen: soft, non-tender; bowel sounds normal; no masses,  no organomegaly Extremities: extremities normal, atraumatic, no cyanosis or edema Skin: Skin color, texture, turgor normal. No rashes or lesions Lymph nodes: Cervical, supraclavicular, and axillary nodes normal. No abnormal inguinal nodes palpated Neurologic: Grossly normal   Pelvic: External  genitalia:  no lesions              Urethra:  normal appearing urethra with no masses, tenderness or lesions              Bartholins and Skenes: normal                 Vagina: normal appearing vagina with normal color and no discharge, no lesions              Cervix: no lesions              Pap taken: Yes.  Bimanual Exam:  Uterus:  enlarged, 12-14 weeks size              Adnexa: normal adnexa and no mass, fullness, tenderness               Rectovaginal: Confirms               Anus:  normal sphincter tone, no lesions  Chaperone, Octaviano Batty, CMA, was present for exam.  Assessment/Plan: 1. Well woman exam with routine gynecological exam - Pap obtained today.  Pt desires yearly pap for now. - MMG 02/2020 and MRI 08/2020.  Seroma noted with bresat MRI - colonoscopy 07/01/2019, follow up 10 years - vaccines reviewed - lab work reviewed.  Pt aware I don't have an updated lipid test.  2. Vaginal discharge - Cervicovaginal ancillary only( [place])  3. History of abnormal cervical Pap smear - 2013 with ASCUS pap with +HR HPV - HPV testing has been negative since that time  4. Malignant neoplasm of upper-inner quadrant of left breast in female, estrogen receptor positive ([place]) - followed by Dr. Marin Olp  5. Intramural and submucous leiomyoma of uterus - PUS 02/2020  6. History of condyloma acuminatum

## 2021-01-02 ENCOUNTER — Other Ambulatory Visit (HOSPITAL_BASED_OUTPATIENT_CLINIC_OR_DEPARTMENT_OTHER): Payer: Self-pay | Admitting: Obstetrics & Gynecology

## 2021-01-02 LAB — CERVICOVAGINAL ANCILLARY ONLY
Bacterial Vaginitis (gardnerella): NEGATIVE
Candida Glabrata: NEGATIVE
Candida Vaginitis: POSITIVE — AB
Comment: NEGATIVE
Comment: NEGATIVE
Comment: NEGATIVE

## 2021-01-02 LAB — CYTOLOGY - PAP
Adequacy: ABSENT
Diagnosis: NEGATIVE

## 2021-01-02 MED ORDER — FLUCONAZOLE 150 MG PO TABS: ORAL_TABLET | ORAL | 0 refills | Status: DC

## 2021-02-02 ENCOUNTER — Other Ambulatory Visit: Payer: Self-pay

## 2021-02-02 ENCOUNTER — Encounter: Payer: Self-pay | Admitting: Hematology & Oncology

## 2021-02-02 ENCOUNTER — Inpatient Hospital Stay: Payer: BC Managed Care – PPO | Attending: Hematology & Oncology

## 2021-02-02 ENCOUNTER — Inpatient Hospital Stay (HOSPITAL_BASED_OUTPATIENT_CLINIC_OR_DEPARTMENT_OTHER): Payer: BC Managed Care – PPO | Admitting: Hematology & Oncology

## 2021-02-02 VITALS — BP 132/72 | HR 69 | Temp 99.0°F | Resp 20 | Wt 204.0 lb

## 2021-02-02 DIAGNOSIS — D0512 Intraductal carcinoma in situ of left breast: Secondary | ICD-10-CM | POA: Insufficient documentation

## 2021-02-02 DIAGNOSIS — C50212 Malignant neoplasm of upper-inner quadrant of left female breast: Secondary | ICD-10-CM | POA: Diagnosis not present

## 2021-02-02 DIAGNOSIS — N921 Excessive and frequent menstruation with irregular cycle: Secondary | ICD-10-CM | POA: Insufficient documentation

## 2021-02-02 DIAGNOSIS — D500 Iron deficiency anemia secondary to blood loss (chronic): Secondary | ICD-10-CM | POA: Diagnosis not present

## 2021-02-02 DIAGNOSIS — Z170 Estrogen receptor positive status [ER+]: Secondary | ICD-10-CM | POA: Diagnosis not present

## 2021-02-02 LAB — CBC WITH DIFFERENTIAL (CANCER CENTER ONLY)
Abs Immature Granulocytes: 0.08 10*3/uL — ABNORMAL HIGH (ref 0.00–0.07)
Basophils Absolute: 0 10*3/uL (ref 0.0–0.1)
Basophils Relative: 1 %
Eosinophils Absolute: 0.1 10*3/uL (ref 0.0–0.5)
Eosinophils Relative: 2 %
HCT: 35.3 % — ABNORMAL LOW (ref 36.0–46.0)
Hemoglobin: 11.4 g/dL — ABNORMAL LOW (ref 12.0–15.0)
Immature Granulocytes: 2 %
Lymphocytes Relative: 32 %
Lymphs Abs: 1.4 10*3/uL (ref 0.7–4.0)
MCH: 29.3 pg (ref 26.0–34.0)
MCHC: 32.3 g/dL (ref 30.0–36.0)
MCV: 90.7 fL (ref 80.0–100.0)
Monocytes Absolute: 0.4 10*3/uL (ref 0.1–1.0)
Monocytes Relative: 9 %
Neutro Abs: 2.4 10*3/uL (ref 1.7–7.7)
Neutrophils Relative %: 54 %
Platelet Count: 230 10*3/uL (ref 150–400)
RBC: 3.89 MIL/uL (ref 3.87–5.11)
RDW: 13.1 % (ref 11.5–15.5)
WBC Count: 4.4 10*3/uL (ref 4.0–10.5)
nRBC: 0 % (ref 0.0–0.2)

## 2021-02-02 LAB — CMP (CANCER CENTER ONLY)
ALT: 34 U/L (ref 0–44)
AST: 41 U/L (ref 15–41)
Albumin: 3.7 g/dL (ref 3.5–5.0)
Alkaline Phosphatase: 47 U/L (ref 38–126)
Anion gap: 7 (ref 5–15)
BUN: 13 mg/dL (ref 6–20)
CO2: 29 mmol/L (ref 22–32)
Calcium: 9.4 mg/dL (ref 8.9–10.3)
Chloride: 103 mmol/L (ref 98–111)
Creatinine: 0.75 mg/dL (ref 0.44–1.00)
GFR, Estimated: >60 mL/min (ref >60)
Glucose, Bld: 101 mg/dL — ABNORMAL HIGH (ref 70–99)
Potassium: 3.4 mmol/L — ABNORMAL LOW (ref 3.5–5.1)
Sodium: 139 mmol/L (ref 135–145)
Total Bilirubin: 0.4 mg/dL (ref 0.3–1.2)
Total Protein: 7.7 g/dL (ref 6.5–8.1)

## 2021-02-02 LAB — IRON AND TIBC
Iron: 65 ug/dL (ref 41–142)
Saturation Ratios: 26 % (ref 21–57)
TIBC: 254 ug/dL (ref 236–444)
UIBC: 189 ug/dL (ref 120–384)

## 2021-02-02 LAB — LACTATE DEHYDROGENASE: LDH: 208 U/L — ABNORMAL HIGH (ref 98–192)

## 2021-02-02 LAB — FERRITIN: Ferritin: 45 ng/mL (ref 11–307)

## 2021-02-02 NOTE — Progress Notes (Signed)
Hematology and Oncology Follow Up Visit  [name] [id number] 08/01/1965 55 y.o. 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 daily-start on 07/08/2018 Radiation therapy -- completed 5200 rad - on 06/20/2019     Interim History:  [name] is back for follow-up.  We saw her 3 months ago.  She had a nice summer.  She and her husband were up in [place] for July 4 weekend.  They had a good time up there.  They will be going up to [place] this weekend to see the [employer] football game.    She says she has not had a cycle since May.  We will have to see what her estrogen studies are.  She does have some hot flashes.  Her last estradiol level back in June was 12.  She is doing well on the Fareston.  She has had no problems with cough or shortness of breath.  There is been no issues with COVID.  She is exercising.  She is working out.  Her appetite has been doing quite well.  She has had no problems with bowels or bladder.  Overall, I would say her performance status is ECOG 0.     Medications:  Current Outpatient Medications:    fluconazole (DIFLUCAN) 150 MG tablet, Take 1 tab x 1 and repeat in 72 hours., Disp: 2 tablet, Rfl: 0   amLODipine-valsartan (EXFORGE) 10-320 MG tablet, Take 1 tablet by mouth daily. , Disp: , Rfl:    Cholecalciferol (VITAMIN D3) 1.25 MG (50000 UT) CAPS, daily., Disp: , Rfl:    Dermatological Products, Misc. (EPICERAM) lotion, EpiCeram topical emulsion, extended release  APPLY TO LEFT CHEST THROUGHOUT THE DAY, Disp: , Rfl:    folic acid (FOLVITE) 1 MG tablet, TAKE 1 TABLET BY MOUTH EVERY DAY, Disp: 90 tablet, Rfl: 3   hydrochlorothiazide (HYDRODIURIL) 25 MG tablet, Take 25 mg by mouth daily. , Disp: , Rfl:    ibuprofen (ADVIL) 600 MG tablet, Take 600 mg by  mouth every 6 (six) hours as needed., Disp: , Rfl:    ketoconazole (NIZORAL) 2 % cream, Apply 1 application topically daily as needed for irritation.  (Patient not taking: Reported on 01/01/2021), Disp: , Rfl:    Omega-3 Fatty Acids (FISH OIL PO), Take by mouth daily., Disp: , Rfl:    tazarotene (TAZORAC) 0.05 % cream, Apply 1 application topically at bedtime as needed (acne). , Disp: , Rfl:    Toremifene Citrate (FARESTON) 60 MG tablet, Take 1 tablet (60 mg total) by mouth daily., Disp: 90 tablet, Rfl: 3   triamcinolone ointment (KENALOG) 0.5 %, Apply 1 application topically 2 (two) times daily. Can use for up to 3 days with irritation.  Do not use more than twice monthly. (Patient taking differently: Apply 1 application topically 2 (two) times daily. Can use for up to 3 days with irritation.  Do not use more than twice monthly.), Disp: 30 g, Rfl: 0   Turmeric (QC TUMERIC COMPLEX PO), Take by mouth., Disp: , Rfl:    vitamin C (ASCORBIC ACID) 500 MG tablet, Take 500 mg by mouth daily. (Patient not taking: Reported on 01/01/2021), Disp: , Rfl:    Zinc Sulfate (ZINC 15 PO), Take by mouth daily., Disp: , Rfl:   Allergies:  Allergies  Allergen Reactions  Minocycline Hcl Hives and Itching   Sulfa Antibiotics Swelling    Swell of the lips     Past Medical History, Surgical history, Social history, and Family History were reviewed and updated.  Review of Systems: Review of Systems  Constitutional: Negative.   HENT:  Negative.    Eyes: Negative.   Respiratory: Negative.    Cardiovascular: Negative.   Gastrointestinal: Negative.   Endocrine: Negative.   Genitourinary: Negative.    Musculoskeletal: Negative.   Skin: Negative.   Neurological: Negative.   Hematological: Negative.   Psychiatric/Behavioral: Negative.     Physical Exam:  vitals were not taken for this visit.   Wt Readings from Last 3 Encounters:  01/01/21 203 lb 3.2 oz (92.2 kg)  11/03/20 205 lb 6.4 oz (93.2 kg)  08/04/20  204 lb 1.9 oz (92.6 kg)    Physical Exam Vitals reviewed.  Constitutional:      Comments: On her breast exam, right breast was without masses, edema or erythema.  There is no right axillary adenopathy.  Left breast is quite hyperpigmented from radiation.  There is some healing areas of skin from the radiation.  There is some contraction from radiation and  surgery.  She has the lumpectomy scar on the areola at about the 11 o'clock position.  There is no erythema.  There is no fullness.  There is no distinct mass in the left breast.  There is no left axillary adenopathy.  HENT:     Head: Normocephalic and atraumatic.  Eyes:     Pupils: Pupils are equal, round, and reactive to light.  Cardiovascular:     Rate and Rhythm: Normal rate and regular rhythm.     Heart sounds: Normal heart sounds.  Pulmonary:     Effort: Pulmonary effort is normal.     Breath sounds: Normal breath sounds.  Abdominal:     General: Bowel sounds are normal.     Palpations: Abdomen is soft.  Musculoskeletal:        General: No tenderness or deformity. Normal range of motion.     Cervical back: Normal range of motion.  Lymphadenopathy:     Cervical: No cervical adenopathy.  Skin:    General: Skin is warm and dry.     Findings: No erythema or rash.  Neurological:     Mental Status: She is alert and oriented to person, place, and time.  Psychiatric:        Behavior: Behavior normal.        Thought Content: Thought content normal.        Judgment: Judgment normal.     Lab Results  Component Value Date   WBC 4.4 02/02/2021   HGB 11.4 (L) 02/02/2021   HCT 35.3 (L) 02/02/2021   MCV 90.7 02/02/2021   PLT 230 02/02/2021     Chemistry      Component Value Date/Time   NA 139 11/03/2020 0803   NA 142 04/12/2017 0858   K 3.5 11/03/2020 0803   K 3.8 04/12/2017 0858   CL 104 11/03/2020 0803   CL 104 04/12/2017 0858   CO2 28 11/03/2020 0803   CO2 26 04/12/2017 0858   BUN 16 11/03/2020 0803   BUN 9  04/12/2017 0858   CREATININE 0.73 11/03/2020 0803   CREATININE 0.8 04/12/2017 0858      Component Value Date/Time   CALCIUM 9.3 11/03/2020 0803   CALCIUM 9.2 04/12/2017 0858   ALKPHOS 45 11/03/2020 0803   ALKPHOS 74 04/12/2017  0858   AST 18 11/03/2020 0803   ALT 21 11/03/2020 0803   ALT 26 04/12/2017 0858   BILITOT 0.4 11/03/2020 0803      Impression and Plan: Ms. Mccrummen is a 55 year old African-American female.  She now has ductal carcinoma in situ of the left breast with microinvasion.    May be, she is now through the change of life.  Again she does not have a cycle for 4 months.  We will see what her estradiol levels are.  Hopefully, we will be able to make a change over to an aromatase inhibitor when we see her back.  I think we can now get her through the holiday season.  Her birthday is in [birth year].  I will plan to see her back in Januar       [name], MD 9/19/20228:07 AM

## 2021-02-03 LAB — FOLLICLE STIMULATING HORMONE: FSH: 19.6 m[IU]/mL

## 2021-02-03 LAB — LUTEINIZING HORMONE: LH: 18.7 m[IU]/mL

## 2021-02-09 LAB — ESTRADIOL, ULTRA SENS: Estradiol, Sensitive: 79 pg/mL

## 2021-02-26 ENCOUNTER — Other Ambulatory Visit: Payer: Self-pay | Admitting: Hematology & Oncology

## 2021-02-26 DIAGNOSIS — Z9889 Other specified postprocedural states: Secondary | ICD-10-CM

## 2021-03-06 ENCOUNTER — Other Ambulatory Visit: Payer: Self-pay

## 2021-03-06 ENCOUNTER — Ambulatory Visit
Admission: RE | Admit: 2021-03-06 | Discharge: 2021-03-06 | Disposition: A | Discharge status: Home / Self Care | Payer: BC Managed Care – PPO | Source: Ambulatory Visit | Attending: Hematology & Oncology | Admitting: Hematology & Oncology

## 2021-03-06 DIAGNOSIS — Z9889 Other specified postprocedural states: Secondary | ICD-10-CM

## 2021-03-30 ENCOUNTER — Other Ambulatory Visit (HOSPITAL_BASED_OUTPATIENT_CLINIC_OR_DEPARTMENT_OTHER): Payer: Self-pay

## 2021-03-30 ENCOUNTER — Ambulatory Visit: Payer: BC Managed Care – PPO | Attending: Internal Medicine

## 2021-03-30 DIAGNOSIS — Z23 Encounter for immunization: Secondary | ICD-10-CM

## 2021-03-30 MED ORDER — INFLUENZA VAC SPLIT QUAD 0.5 ML IM SUSY
PREFILLED_SYRINGE | INTRAMUSCULAR | 0 refills | Status: DC
  Filled 2021-03-30: qty 0.5, 1d supply, fill #0

## 2021-03-30 NOTE — Progress Notes (Signed)
   Covid-19 Vaccination Clinic  Name:  [name]    MRN: [id number] DOB: 1965/06/11  03/30/2021  [name] was observed post Covid-19 immunization for 15 minutes without incident. She was provided with Vaccine Information Sheet and instruction to access the V-Safe system.   [name] was instructed to call 911 with any severe reactions post vaccine: Difficulty breathing  Swelling of face and throat  A fast heartbeat  A bad rash all over body  Dizziness and weakness   Immunizations Administered     Name Date Dose VIS Date Route   Pfizer Covid-19 Vaccine Bivalent Booster 03/30/2021 10:35 AM 0.3 mL 01/14/2021 Intramuscular   Manufacturer: [place]   Lot: [id number]   [place]: [phone]

## 2021-04-17 ENCOUNTER — Other Ambulatory Visit (HOSPITAL_BASED_OUTPATIENT_CLINIC_OR_DEPARTMENT_OTHER): Payer: Self-pay

## 2021-04-17 MED ORDER — PFIZER COVID-19 VAC BIVALENT 30 MCG/0.3ML IM SUSP
INTRAMUSCULAR | 0 refills | Status: DC
  Filled 2021-04-17: qty 0.3, 1d supply, fill #0

## 2021-06-08 ENCOUNTER — Encounter: Payer: Self-pay | Admitting: Hematology & Oncology

## 2021-06-08 ENCOUNTER — Telehealth: Payer: Self-pay | Admitting: *Deleted

## 2021-06-08 ENCOUNTER — Inpatient Hospital Stay: Payer: BC Managed Care – PPO | Admitting: Hematology & Oncology

## 2021-06-08 ENCOUNTER — Other Ambulatory Visit: Payer: Self-pay

## 2021-06-08 ENCOUNTER — Inpatient Hospital Stay: Payer: BC Managed Care – PPO | Attending: Hematology & Oncology

## 2021-06-08 VITALS — BP 122/78 | HR 82 | Temp 99.2°F | Resp 18 | Wt 199.0 lb

## 2021-06-08 DIAGNOSIS — R0789 Other chest pain: Secondary | ICD-10-CM | POA: Insufficient documentation

## 2021-06-08 DIAGNOSIS — D500 Iron deficiency anemia secondary to blood loss (chronic): Secondary | ICD-10-CM | POA: Diagnosis not present

## 2021-06-08 DIAGNOSIS — D0512 Intraductal carcinoma in situ of left breast: Secondary | ICD-10-CM | POA: Insufficient documentation

## 2021-06-08 DIAGNOSIS — Z923 Personal history of irradiation: Secondary | ICD-10-CM | POA: Diagnosis not present

## 2021-06-08 DIAGNOSIS — Z170 Estrogen receptor positive status [ER+]: Secondary | ICD-10-CM

## 2021-06-08 DIAGNOSIS — C50212 Malignant neoplasm of upper-inner quadrant of left female breast: Secondary | ICD-10-CM

## 2021-06-08 DIAGNOSIS — Z79810 Long term (current) use of selective estrogen receptor modulators (SERMs): Secondary | ICD-10-CM | POA: Insufficient documentation

## 2021-06-08 LAB — CBC WITH DIFFERENTIAL (CANCER CENTER ONLY)
Abs Immature Granulocytes: 0.08 10*3/uL — ABNORMAL HIGH (ref 0.00–0.07)
Basophils Absolute: 0 10*3/uL (ref 0.0–0.1)
Basophils Relative: 0 %
Eosinophils Absolute: 0.1 10*3/uL (ref 0.0–0.5)
Eosinophils Relative: 1 %
HCT: 36.3 % (ref 36.0–46.0)
Hemoglobin: 11.5 g/dL — ABNORMAL LOW (ref 12.0–15.0)
Immature Granulocytes: 2 %
Lymphocytes Relative: 31 %
Lymphs Abs: 1.5 10*3/uL (ref 0.7–4.0)
MCH: 29.3 pg (ref 26.0–34.0)
MCHC: 31.7 g/dL (ref 30.0–36.0)
MCV: 92.6 fL (ref 80.0–100.0)
Monocytes Absolute: 0.4 10*3/uL (ref 0.1–1.0)
Monocytes Relative: 8 %
Neutro Abs: 2.8 10*3/uL (ref 1.7–7.7)
Neutrophils Relative %: 58 %
Platelet Count: 198 10*3/uL (ref 150–400)
RBC: 3.92 MIL/uL (ref 3.87–5.11)
RDW: 13.3 % (ref 11.5–15.5)
WBC Count: 4.9 10*3/uL (ref 4.0–10.5)
nRBC: 0 % (ref 0.0–0.2)

## 2021-06-08 LAB — CMP (CANCER CENTER ONLY)
ALT: 20 U/L (ref 0–44)
AST: 17 U/L (ref 15–41)
Albumin: 3.6 g/dL (ref 3.5–5.0)
Alkaline Phosphatase: 56 U/L (ref 38–126)
Anion gap: 7 (ref 5–15)
BUN: 14 mg/dL (ref 6–20)
CO2: 27 mmol/L (ref 22–32)
Calcium: 9.5 mg/dL (ref 8.9–10.3)
Chloride: 106 mmol/L (ref 98–111)
Creatinine: 0.76 mg/dL (ref 0.44–1.00)
GFR, Estimated: >60 mL/min (ref >60)
Glucose, Bld: 114 mg/dL — ABNORMAL HIGH (ref 70–99)
Potassium: 3.5 mmol/L (ref 3.5–5.1)
Sodium: 140 mmol/L (ref 135–145)
Total Bilirubin: 0.4 mg/dL (ref 0.3–1.2)
Total Protein: 7.4 g/dL (ref 6.5–8.1)

## 2021-06-08 LAB — LACTATE DEHYDROGENASE: LDH: 193 U/L — ABNORMAL HIGH (ref 98–192)

## 2021-06-08 MED ORDER — TOREMIFENE CITRATE 60 MG PO TABS: 60.0000 mg | ORAL_TABLET | Freq: Every day | ORAL | 3 refills | Status: DC

## 2021-06-08 NOTE — Telephone Encounter (Signed)
Per 06/08/21 los - gave upcoming appointments - confirmed

## 2021-06-08 NOTE — Progress Notes (Signed)
Hematology and Oncology Follow Up Visit  [name] [id number] 05-Dec-1965 56 y.o. 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 daily-start on 07/08/2018 Radiation therapy -- completed 5200 rad - on 06/20/2019     Interim History:  Ms. Amble is back for follow-up.  We last saw her back in September.  Since then, she been doing well.  She has had no problems with the Fareston.  We are still checking her menopausal status.  She is currently premenopausal.  Her last estradiol level was 79.  She did see orthopedic surgery about the left shoulder.  Sound like she may have Radel bit of AC joint issues.  She was put on some low-dose steroids which seemed to help a Reichenbach bit.  She is complaining of a Niederer bit of pain under the left breast.  This sounds like it might be radicular type of pain.  This might be from where she had surgery and radiation therapy.  She has had no problems with bowels or bladder.  She is on a vegetarian type diet right now.  She is lost a Basinski bit of weight which she is happy about.  She has had no bleeding.  There has been no cough.  She has had no issues with COVID.  She and her husband are not sure where they will travel to this year.  Overall, I would say performance status is ECOG 1.     Medications:  Current Outpatient Medications:    amLODipine-valsartan (EXFORGE) 10-320 MG tablet, Take 1 tablet by mouth daily. , Disp: , Rfl:    Cholecalciferol (VITAMIN D3) 125 MCG (5000 UT) CAPS, daily., Disp: , Rfl:    COVID-19 mRNA bivalent vaccine, Pfizer, (PFIZER COVID-19 VAC BIVALENT) injection, Inject into the muscle., Disp: 0.3 mL, Rfl: 0   Dermatological Products, Misc. (EPICERAM) lotion, EpiCeram topical emulsion, extended release  APPLY TO LEFT CHEST THROUGHOUT THE DAY, Disp:  , Rfl:    folic acid (FOLVITE) 1 MG tablet, TAKE 1 TABLET BY MOUTH EVERY DAY, Disp: 90 tablet, Rfl: 3   hydrochlorothiazide (HYDRODIURIL) 25 MG tablet, Take 25 mg by mouth daily. , Disp: , Rfl:    influenza vac split quadrivalent PF (FLUARIX) 0.5 ML injection, Inject into the muscle., Disp: 0.5 mL, Rfl: 0   ketoconazole (NIZORAL) 2 % cream, Apply 1 application topically daily as needed for irritation., Disp: , Rfl:    Omega-3 Fatty Acids (FISH OIL PO), Take by mouth daily. (Patient not taking: Reported on 02/02/2021), Disp: , Rfl:    tazarotene (TAZORAC) 0.05 % cream, Apply 1 application topically at bedtime as needed (acne). , Disp: , Rfl:    Toremifene Citrate (FARESTON) 60 MG tablet, Take 1 tablet (60 mg total) by mouth daily., Disp: 90 tablet, Rfl: 3   triamcinolone ointment (KENALOG) 0.5 %, Apply 1 application topically 2 (two) times daily. Can use for up to 3 days with irritation.  Do not use more than twice monthly. (Patient not taking: Reported on 02/02/2021), Disp: 30 g, Rfl: 0   Turmeric (QC TUMERIC COMPLEX PO), Take by mouth daily., Disp: , Rfl:    vitamin C (ASCORBIC ACID) 500 MG tablet, Take 500 mg by mouth daily. (Patient not taking: No sig reported), Disp: , Rfl:    Zinc Sulfate (ZINC 15 PO), Take by mouth daily.,  Disp: , Rfl:   Allergies:  Allergies  Allergen Reactions   Minocycline Hcl Hives and Itching   Sulfa Antibiotics Swelling    Swell of the lips     Past Medical History, Surgical history, Social history, and Family History were reviewed and updated.  Review of Systems: Review of Systems  Constitutional: Negative.   HENT:  Negative.    Eyes: Negative.   Respiratory: Negative.    Cardiovascular: Negative.   Gastrointestinal: Negative.   Endocrine: Negative.   Genitourinary: Negative.    Musculoskeletal: Negative.   Skin: Negative.   Neurological: Negative.   Hematological: Negative.   Psychiatric/Behavioral: Negative.     Physical Exam:  weight is 199 lb  (90.3 kg). Her oral temperature is 99.2 F (37.3 C). Her blood pressure is 122/78 and her pulse is 82. Her respiration is 18 and oxygen saturation is 100%.   Wt Readings from Last 3 Encounters:  06/08/21 199 lb (90.3 kg)  02/02/21 204 lb (92.5 kg)  01/01/21 203 lb 3.2 oz (92.2 kg)    Physical Exam Vitals reviewed.  Constitutional:      Comments: On her breast exam, right breast was without masses, edema or erythema.  There is no right axillary adenopathy.  Left breast is quite hyperpigmented from radiation.  There is some healing areas of skin from the radiation.  There is some contraction from radiation and  surgery.  She has the lumpectomy scar on the areola at about the 11 o'clock position.  There is no erythema.  There is no fullness.  There is no distinct mass in the left breast.  There is no left axillary adenopathy.  HENT:     Head: Normocephalic and atraumatic.  Eyes:     Pupils: Pupils are equal, round, and reactive to light.  Cardiovascular:     Rate and Rhythm: Normal rate and regular rhythm.     Heart sounds: Normal heart sounds.  Pulmonary:     Effort: Pulmonary effort is normal.     Breath sounds: Normal breath sounds.  Abdominal:     General: Bowel sounds are normal.     Palpations: Abdomen is soft.  Musculoskeletal:        General: No tenderness or deformity. Normal range of motion.     Cervical back: Normal range of motion.  Lymphadenopathy:     Cervical: No cervical adenopathy.  Skin:    General: Skin is warm and dry.     Findings: No erythema or rash.  Neurological:     Mental Status: She is alert and oriented to person, place, and time.  Psychiatric:        Behavior: Behavior normal.        Thought Content: Thought content normal.        Judgment: Judgment normal.     Lab Results  Component Value Date   WBC 4.9 06/08/2021   HGB 11.5 (L) 06/08/2021   HCT 36.3 06/08/2021   MCV 92.6 06/08/2021   PLT 198 06/08/2021     Chemistry      Component  Value Date/Time   NA 140 06/08/2021 0753   NA 142 04/12/2017 0858   K 3.5 06/08/2021 0753   K 3.8 04/12/2017 0858   CL 106 06/08/2021 0753   CL 104 04/12/2017 0858   CO2 27 06/08/2021 0753   CO2 26 04/12/2017 0858   BUN 14 06/08/2021 0753   BUN 9 04/12/2017 0858   CREATININE 0.76 06/08/2021 0753   CREATININE  0.8 04/12/2017 0858      Component Value Date/Time   CALCIUM 9.5 06/08/2021 0753   CALCIUM 9.2 04/12/2017 0858   ALKPHOS 56 06/08/2021 0753   ALKPHOS 74 04/12/2017 0858   AST 17 06/08/2021 0753   ALT 20 06/08/2021 0753   ALT 26 04/12/2017 0858   BILITOT 0.4 06/08/2021 0753      Impression and Plan: Ms. Shores is a 56 year old African-American female.  She now has ductal carcinoma in situ of the left breast with microinvasion.    Hopefully, she is going through the change of life.  We will check what her hormone levels are.  If we do find that she is postmenopausal, we will switch her over to either Arimidex or Femara.  She will have a MRI of the breast in April.  We will set this up.  We will plan to get her back in 4 more months.      [name], MD 1/23/20238:36 AM

## 2021-06-09 LAB — FOLLICLE STIMULATING HORMONE: FSH: 17.1 m[IU]/mL

## 2021-06-09 LAB — LUTEINIZING HORMONE: LH: 11.5 m[IU]/mL

## 2021-06-10 ENCOUNTER — Encounter: Payer: Self-pay | Admitting: *Deleted

## 2021-06-10 LAB — ESTRADIOL, ULTRA SENS: Estradiol, Sensitive: 16 pg/mL

## 2021-06-22 ENCOUNTER — Encounter (HOSPITAL_BASED_OUTPATIENT_CLINIC_OR_DEPARTMENT_OTHER): Payer: Self-pay | Admitting: Obstetrics & Gynecology

## 2021-06-22 ENCOUNTER — Ambulatory Visit (HOSPITAL_BASED_OUTPATIENT_CLINIC_OR_DEPARTMENT_OTHER): Payer: BC Managed Care – PPO | Admitting: Obstetrics & Gynecology

## 2021-06-22 ENCOUNTER — Other Ambulatory Visit (HOSPITAL_COMMUNITY)
Admission: RE | Admit: 2021-06-22 | Discharge: 2021-06-22 | Disposition: A | Discharge status: Home / Self Care | Payer: BC Managed Care – PPO | Source: Ambulatory Visit | Attending: Obstetrics & Gynecology | Admitting: Obstetrics & Gynecology

## 2021-06-22 ENCOUNTER — Telehealth (HOSPITAL_BASED_OUTPATIENT_CLINIC_OR_DEPARTMENT_OTHER): Payer: Self-pay

## 2021-06-22 ENCOUNTER — Other Ambulatory Visit: Payer: Self-pay

## 2021-06-22 VITALS — BP 139/79 | HR 68 | Ht 65.0 in | Wt 199.8 lb

## 2021-06-22 DIAGNOSIS — N898 Other specified noninflammatory disorders of vagina: Secondary | ICD-10-CM

## 2021-06-22 MED ORDER — FLUCONAZOLE 150 MG PO TABS: 150.0000 mg | ORAL_TABLET | Freq: Once | ORAL | 0 refills | Status: AC

## 2021-06-22 NOTE — Telephone Encounter (Signed)
Pharmacy

## 2021-06-22 NOTE — Progress Notes (Signed)
GYNECOLOGY  VISIT  CC:   vaginal discharge  HPI: 56 y.o. [id number] Married [name] or [place] American female here for vaginal discharge that has been present for a few weeks.  This has gradually worsened and is now watery and clear but there is itching.  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  These were the ones she has highest allergies to and thinks she is eliminating from diet.  Denies vaginal bleeding.    No vaginal bleeding since 10/06/2020.  FSH was 22 with Dr. Marin Olp last week.  Patient Active Problem List   Diagnosis Date Noted   Protrusion of cervical intervertebral disc 11/07/2019   Gastroesophageal reflux disease 08/06/2019   Genetic testing 03/16/2019   Family history of cancer    Malignant neoplasm of upper-inner quadrant of left breast in female, estrogen receptor positive ([place]) 02/21/2019   Uterine leiomyoma 09/29/2015   Fibroids 10/28/2014    Class: History of   Female infertility 03/19/2014   Numbness of face 01/04/2014   Other malaise and fatigue 01/04/2014   Disturbance of skin sensation 01/04/2014   Condyloma 05/31/2013   Anemia, iron deficiency 12/25/2012   Hypertensive disorder 01/30/2009   CYSTIC ACNE 01/30/2009   HYPERSOMNIA 01/30/2009   PALPITATIONS, HX OF 01/30/2009    Past Medical History:  Diagnosis Date   Abnormal Pap smear 7/13   ASCUS/positive HR HPV, negative colpo   Anemia 2006   Breast cancer ([place]) 2020   Left Breast Cancer   Cancer ([place])    Breast- left   Condylomata acuminata in female    Family history of cancer    Fibroids    with enlarged uterus   GERD (gastroesophageal reflux disease)    Hypertension    Personal history of radiation therapy 2020   Left Breast Cancer   Pulmonary embolism ([place]) 2008   secondary to OCP   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)     Past Surgical History:  Procedure  Laterality Date   BREAST LUMPECTOMY Left 03/20/2019   BREAST LUMPECTOMY WITH RADIOACTIVE SEED LOCALIZATION Left 03/20/2019   Procedure: LEFT BREAST LUMPECTOMY WITH RADIOACTIVE SEED LOCALIZATION;  Surgeon: Coralie Keens, MD;  Location: [facility];  Service: General;  Laterality: Left;   ESOPHAGOGASTRODUODENOSCOPY  2020   IR GENERIC HISTORICAL  03/31/2016   IR RADIOLOGIST EVAL & MGMT 03/31/2016 Marybelle Killings, MD [facility] INTERV RAD   IR GENERIC HISTORICAL  01/06/2016   IR RADIOLOGIST EVAL & MGMT 01/06/2016 Marybelle Killings, MD [facility] INTERV RAD   left finger reattachment  age 5   [place]  2003   MYOMECTOMY  2008   via laparoscopy   RE-EXCISION OF BREAST CANCER,SUPERIOR MARGINS Left 04/06/2019   Procedure: RE-EXCISION OF LEFT BREAST CANCER MARGINS;  Surgeon: Coralie Keens, MD;  Location[facility] OR;  Service: General;  Laterality: Left;    MEDS:   Current Outpatient Medications on File Prior to Visit  Medication Sig Dispense Refill   amLODipine-valsartan (EXFORGE) 10-320 MG tablet Take 1 tablet by mouth daily.      Cholecalciferol (VITAMIN D3) 125 MCG (5000 UT) CAPS daily.     COVID-19 mRNA bivalent vaccine, Pfizer, (PFIZER COVID-19 VAC BIVALENT) injection Inject into the muscle. 0.3 mL 0   Dermatological Products, Misc. (EPICERAM) lotion EpiCeram topical emulsion, extended release  APPLY TO LEFT CHEST THROUGHOUT THE DAY  folic acid (FOLVITE) 1 MG tablet TAKE 1 TABLET BY MOUTH EVERY DAY 90 tablet 3   hydrochlorothiazide (HYDRODIURIL) 25 MG tablet Take 25 mg by mouth daily.      influenza vac split quadrivalent PF (FLUARIX) 0.5 ML injection Inject into the muscle. 0.5 mL 0   ketoconazole (NIZORAL) 2 % cream Apply 1 application topically daily as needed for irritation.     Omega-3 Fatty Acids (FISH OIL PO) Take by mouth daily.     tazarotene (TAZORAC) 0.05 % cream Apply 1 application topically at bedtime as needed (acne).      Toremifene Citrate (FARESTON) 60 MG  tablet Take 1 tablet (60 mg total) by mouth daily. 90 tablet 3   Turmeric (QC TUMERIC COMPLEX PO) Take by mouth daily.     vitamin C (ASCORBIC ACID) 500 MG tablet Take 500 mg by mouth daily.     Zinc Sulfate (ZINC 15 PO) Take by mouth daily.     triamcinolone ointment (KENALOG) 0.5 % Apply 1 application topically 2 (two) times daily. Can use for up to 3 days with irritation.  Do not use more than twice monthly. (Patient not taking: Reported on 02/02/2021) 30 g 0   No current facility-administered medications on file prior to visit.    ALLERGIES: Minocycline hcl and Sulfa antibiotics  Family History  Problem Relation Age of Onset   Diabetes Mother    Hypertension Mother    Heart disease Mother        cardiomegaly   Lupus Mother    Diabetes Father    Hypertension Father    Stroke Father    Hypertension Maternal Grandmother    Stroke Maternal Grandmother    Hypertension Sister    Seizures Sister    COPD Sister    Ovarian cancer Paternal Aunt     SH:  married, non smoker  Review of Systems  All other systems reviewed and are negative.  PHYSICAL EXAMINATION:    BP 139/79 (BP Location: Right Arm, Patient Position: Sitting, Cuff Size: Large)    Pulse 68    Ht 5\' 5"  (1.651 m)    Wt 199 lb 12.8 oz (90.6 kg)    BMI 33.25 kg/m     General appearance: alert, cooperative and appears stated age Lymph:  no inguinal LAD noted  Pelvic: External genitalia:  no lesions              Urethra:  normal appearing urethra with no masses, tenderness or lesions              Bartholins and Skenes: normal                 Vagina: normal appearing vagina with normal color , watery discharge with white clumpy tissue adherent to vaginal wall noted              Cervix: no lesions  Chaperone, Octaviano Batty, CMA, was present for exam.  Assessment/Plan: 1. Vaginal discharge - Cervicovaginal ancillary only( [place]) - as this appears to be yeast will treat with Terazol 7, one applicator nightly x 7  nights.

## 2021-06-22 NOTE — Telephone Encounter (Signed)
Pharmacy called stating there is a QTC Prolongation drug interaction between the diflucan and Toremisene. Please advise.

## 2021-06-29 LAB — CERVICOVAGINAL ANCILLARY ONLY
Bacterial Vaginitis (gardnerella): NEGATIVE
Candida Glabrata: NEGATIVE
Candida Vaginitis: POSITIVE — AB
Chlamydia: NEGATIVE
Comment: NEGATIVE
Comment: NEGATIVE
Comment: NEGATIVE
Comment: NEGATIVE
Comment: NORMAL
Neisseria Gonorrhea: NEGATIVE

## 2021-07-23 ENCOUNTER — Other Ambulatory Visit: Payer: Self-pay | Admitting: Hematology & Oncology

## 2021-07-30 ENCOUNTER — Other Ambulatory Visit: Payer: Self-pay | Admitting: Physician Assistant

## 2021-09-02 ENCOUNTER — Other Ambulatory Visit: Payer: Self-pay | Admitting: Hematology & Oncology

## 2021-09-02 ENCOUNTER — Ambulatory Visit
Admission: RE | Admit: 2021-09-02 | Discharge: 2021-09-02 | Disposition: A | Discharge status: Home / Self Care | Payer: BC Managed Care – PPO | Source: Ambulatory Visit | Attending: Hematology & Oncology | Admitting: Hematology & Oncology

## 2021-09-02 ENCOUNTER — Inpatient Hospital Stay: Payer: BC Managed Care – PPO | Admitting: Hematology & Oncology

## 2021-09-02 DIAGNOSIS — Z170 Estrogen receptor positive status [ER+]: Secondary | ICD-10-CM

## 2021-09-02 DIAGNOSIS — M545 Low back pain, unspecified: Secondary | ICD-10-CM

## 2021-09-02 MED ORDER — GADOBUTROL 1 MMOL/ML IV SOLN
9.0000 mL | Freq: Once | INTRAVENOUS | Status: AC | PRN
  Administered 2021-09-02: 9 mL via INTRAVENOUS

## 2021-09-09 ENCOUNTER — Telehealth: Payer: Self-pay | Admitting: Hematology & Oncology

## 2021-09-28 ENCOUNTER — Encounter: Payer: Self-pay | Admitting: Hematology & Oncology

## 2021-09-28 DIAGNOSIS — C50912 Malignant neoplasm of unspecified site of left female breast: Secondary | ICD-10-CM

## 2021-09-28 MED ORDER — TOREMIFENE CITRATE 60 MG PO TABS: 60.0000 mg | ORAL_TABLET | Freq: Every day | ORAL | 1 refills | Status: DC

## 2021-10-05 ENCOUNTER — Other Ambulatory Visit: Payer: Self-pay

## 2021-10-05 ENCOUNTER — Inpatient Hospital Stay: Payer: BC Managed Care – PPO | Attending: Hematology & Oncology

## 2021-10-05 ENCOUNTER — Inpatient Hospital Stay (HOSPITAL_BASED_OUTPATIENT_CLINIC_OR_DEPARTMENT_OTHER): Payer: BC Managed Care – PPO | Admitting: Hematology & Oncology

## 2021-10-05 ENCOUNTER — Encounter: Payer: Self-pay | Admitting: Hematology & Oncology

## 2021-10-05 VITALS — BP 120/72 | HR 62 | Temp 99.0°F | Resp 18 | Ht 65.0 in | Wt 199.1 lb

## 2021-10-05 DIAGNOSIS — N921 Excessive and frequent menstruation with irregular cycle: Secondary | ICD-10-CM | POA: Insufficient documentation

## 2021-10-05 DIAGNOSIS — D0512 Intraductal carcinoma in situ of left breast: Secondary | ICD-10-CM | POA: Insufficient documentation

## 2021-10-05 DIAGNOSIS — Z923 Personal history of irradiation: Secondary | ICD-10-CM | POA: Diagnosis not present

## 2021-10-05 DIAGNOSIS — D500 Iron deficiency anemia secondary to blood loss (chronic): Secondary | ICD-10-CM | POA: Insufficient documentation

## 2021-10-05 DIAGNOSIS — C50212 Malignant neoplasm of upper-inner quadrant of left female breast: Secondary | ICD-10-CM

## 2021-10-05 DIAGNOSIS — Z170 Estrogen receptor positive status [ER+]: Secondary | ICD-10-CM

## 2021-10-05 LAB — CBC WITH DIFFERENTIAL (CANCER CENTER ONLY)
Abs Immature Granulocytes: 0.07 10*3/uL (ref 0.00–0.07)
Basophils Absolute: 0 10*3/uL (ref 0.0–0.1)
Basophils Relative: 1 %
Eosinophils Absolute: 0.1 10*3/uL (ref 0.0–0.5)
Eosinophils Relative: 2 %
HCT: 35.8 % — ABNORMAL LOW (ref 36.0–46.0)
Hemoglobin: 11.5 g/dL — ABNORMAL LOW (ref 12.0–15.0)
Immature Granulocytes: 1 %
Lymphocytes Relative: 29 %
Lymphs Abs: 1.5 10*3/uL (ref 0.7–4.0)
MCH: 29.6 pg (ref 26.0–34.0)
MCHC: 32.1 g/dL (ref 30.0–36.0)
MCV: 92 fL (ref 80.0–100.0)
Monocytes Absolute: 0.4 10*3/uL (ref 0.1–1.0)
Monocytes Relative: 8 %
Neutro Abs: 3 10*3/uL (ref 1.7–7.7)
Neutrophils Relative %: 59 %
Platelet Count: 217 10*3/uL (ref 150–400)
RBC: 3.89 MIL/uL (ref 3.87–5.11)
RDW: 13 % (ref 11.5–15.5)
WBC Count: 5.1 10*3/uL (ref 4.0–10.5)
nRBC: 0 % (ref 0.0–0.2)

## 2021-10-05 LAB — CMP (CANCER CENTER ONLY)
ALT: 17 U/L (ref 0–44)
AST: 17 U/L (ref 15–41)
Albumin: 3.7 g/dL (ref 3.5–5.0)
Alkaline Phosphatase: 57 U/L (ref 38–126)
Anion gap: 5 (ref 5–15)
BUN: 16 mg/dL (ref 6–20)
CO2: 29 mmol/L (ref 22–32)
Calcium: 9.3 mg/dL (ref 8.9–10.3)
Chloride: 106 mmol/L (ref 98–111)
Creatinine: 0.76 mg/dL (ref 0.44–1.00)
GFR, Estimated: >60 mL/min (ref >60)
Glucose, Bld: 102 mg/dL — ABNORMAL HIGH (ref 70–99)
Potassium: 3.6 mmol/L (ref 3.5–5.1)
Sodium: 140 mmol/L (ref 135–145)
Total Bilirubin: 0.3 mg/dL (ref 0.3–1.2)
Total Protein: 7.8 g/dL (ref 6.5–8.1)

## 2021-10-05 NOTE — Progress Notes (Signed)
Hematology and Oncology Follow Up Visit  [name] [id number] Mar 26, 1966 56 y.o. 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 daily-start on 07/08/2018 Radiation therapy -- completed 5200 rad - on 06/20/2019     Interim History:  [name] is back for follow-up.  She is doing quite well.  She has a new job.  She certainly is enjoying her job.  She did have a breast MRI done back in April.  The breast MRI did not show any evidence of recurrence.  She is still doing well on the Fareston.  Her last studies, she is still premenopausal.  Her estradiol was 16.  We will see what her levels are today.  She has had no change in bowel or bladder habits.  Patient has had a Buer bit of pain under the left breast.  This may be a Gutierres bit of nerve irritation.  I told her to try some over-the-counter cream that contains capsaicin.  She has had no leg swelling.  There is been no rashes.  Her left shoulder is doing much better.  She does not complain of any pain in the left shoulder.  Overall, I would say performance status is probably ECOG 1.    Medications:  Current Outpatient Medications:    amLODipine-valsartan (EXFORGE) 10-320 MG tablet, Take 1 tablet by mouth daily. , Disp: , Rfl:    Cholecalciferol (VITAMIN D3) 125 MCG (5000 UT) CAPS, daily., Disp: , Rfl:    Dermatological Products, Misc. (EPICERAM) lotion, EpiCeram topical emulsion, extended release  APPLY TO LEFT CHEST THROUGHOUT THE DAY, Disp: , Rfl:    folic acid (FOLVITE) 1 MG tablet, TAKE 1 TABLET BY MOUTH EVERY DAY, Disp: 90 tablet, Rfl: 3   hydrochlorothiazide (HYDRODIURIL) 25 MG tablet, Take 25 mg by mouth daily. , Disp: , Rfl:    ketoconazole (NIZORAL) 2 % cream, Apply 1 application topically daily as needed for irritation., Disp: ,  Rfl:    Omega-3 Fatty Acids (FISH OIL PO), Take by mouth daily., Disp: , Rfl:    tazarotene (TAZORAC) 0.05 % cream, Apply 1 application topically at bedtime as needed (acne). , Disp: , Rfl:    Toremifene Citrate (FARESTON) 60 MG tablet, Take 1 tablet (60 mg total) by mouth daily., Disp: 90 tablet, Rfl: 1   triamcinolone ointment (KENALOG) 0.5 %, Apply 1 application topically 2 (two) times daily. Can use for up to 3 days with irritation.  Do not use more than twice monthly., Disp: 30 g, Rfl: 0   Turmeric (QC TUMERIC COMPLEX PO), Take by mouth daily., Disp: , Rfl:    vitamin C (ASCORBIC ACID) 500 MG tablet, Take 500 mg by mouth daily., Disp: , Rfl:    Zinc Sulfate (ZINC 15 PO), Take by mouth daily., Disp: , Rfl:   Allergies:  Allergies  Allergen Reactions   Minocycline Hcl Hives and Itching   Sulfa Antibiotics Swelling    Swell of the lips     Past Medical History, Surgical history, Social history, and Family History were reviewed and updated.  Review of Systems: Review of Systems  Constitutional: Negative.   HENT:  Negative.    Eyes: Negative.   Respiratory: Negative.    Cardiovascular: Negative.   Gastrointestinal: Negative.   Endocrine: Negative.   Genitourinary: Negative.    Musculoskeletal:  Negative.   Skin: Negative.   Neurological: Negative.   Hematological: Negative.   Psychiatric/Behavioral: Negative.     Physical Exam:  height is '5\' 5"'$  (1.651 m) and weight is 199 lb 1.9 oz (90.3 kg). Her oral temperature is 99 F (37.2 C). Her blood pressure is 120/72 and her pulse is 62. Her respiration is 18 and oxygen saturation is 96%.   Wt Readings from Last 3 Encounters:  10/05/21 199 lb 1.9 oz (90.3 kg)  06/22/21 199 lb 12.8 oz (90.6 kg)  06/08/21 199 lb (90.3 kg)    Physical Exam Vitals reviewed.  Constitutional:      Comments: On her breast exam, right breast was without masses, edema or erythema.  There is no right axillary adenopathy.  Left breast is quite  hyperpigmented from radiation.  There is some healing areas of skin from the radiation.  There is some contraction from radiation and  surgery.  She has the lumpectomy scar on the areola at about the 11 o'clock position.  There is no erythema.  There is no fullness.  There is no distinct mass in the left breast.  There is no left axillary adenopathy.  HENT:     Head: Normocephalic and atraumatic.  Eyes:     Pupils: Pupils are equal, round, and reactive to light.  Cardiovascular:     Rate and Rhythm: Normal rate and regular rhythm.     Heart sounds: Normal heart sounds.  Pulmonary:     Effort: Pulmonary effort is normal.     Breath sounds: Normal breath sounds.  Abdominal:     General: Bowel sounds are normal.     Palpations: Abdomen is soft.  Musculoskeletal:        General: No tenderness or deformity. Normal range of motion.     Cervical back: Normal range of motion.  Lymphadenopathy:     Cervical: No cervical adenopathy.  Skin:    General: Skin is warm and dry.     Findings: No erythema or rash.  Neurological:     Mental Status: She is alert and oriented to person, place, and time.  Psychiatric:        Behavior: Behavior normal.        Thought Content: Thought content normal.        Judgment: Judgment normal.     Lab Results  Component Value Date   WBC 5.1 10/05/2021   HGB 11.5 (L) 10/05/2021   HCT 35.8 (L) 10/05/2021   MCV 92.0 10/05/2021   PLT 217 10/05/2021     Chemistry      Component Value Date/Time   NA 140 06/08/2021 0753   NA 142 04/12/2017 0858   K 3.5 06/08/2021 0753   K 3.8 04/12/2017 0858   CL 106 06/08/2021 0753   CL 104 04/12/2017 0858   CO2 27 06/08/2021 0753   CO2 26 04/12/2017 0858   BUN 14 06/08/2021 0753   BUN 9 04/12/2017 0858   CREATININE 0.76 06/08/2021 0753   CREATININE 0.8 04/12/2017 0858      Component Value Date/Time   CALCIUM 9.5 06/08/2021 0753   CALCIUM 9.2 04/12/2017 0858   ALKPHOS 56 06/08/2021 0753   ALKPHOS 74 04/12/2017  0858   AST 17 06/08/2021 0753   ALT 20 06/08/2021 0753   ALT 26 04/12/2017 0858   BILITOT 0.4 06/08/2021 0753      Impression and Plan: [name] is a 56 year old African-American female.  She now has ductal carcinoma in  situ of the left breast with microinvasion.    I am so happy that she is enjoying her new job.  I know she will do very well with this.  We will have to see what her menopausal status is.  Hopefully, we will be able to switch over to an aromatase inhibitor.  We will go ahead and plan to get her back after Labor Day Day.  We will get her through all of summer.      [name], MD 5/22/20238:19 AM

## 2021-10-06 LAB — LUTEINIZING HORMONE: LH: 11.7 m[IU]/mL

## 2021-10-06 LAB — FOLLICLE STIMULATING HORMONE: FSH: 17.3 m[IU]/mL

## 2021-10-08 LAB — ESTRADIOL, ULTRA SENS: Estradiol, Sensitive: 14.4 pg/mL

## 2022-01-11 ENCOUNTER — Ambulatory Visit (INDEPENDENT_AMBULATORY_CARE_PROVIDER_SITE_OTHER): Payer: BC Managed Care – PPO | Admitting: Obstetrics & Gynecology

## 2022-01-11 ENCOUNTER — Other Ambulatory Visit (HOSPITAL_COMMUNITY)
Admission: RE | Admit: 2022-01-11 | Discharge: 2022-01-11 | Disposition: A | Discharge status: Home / Self Care | Payer: BC Managed Care – PPO | Source: Ambulatory Visit | Attending: Obstetrics & Gynecology | Admitting: Obstetrics & Gynecology

## 2022-01-11 ENCOUNTER — Encounter (HOSPITAL_BASED_OUTPATIENT_CLINIC_OR_DEPARTMENT_OTHER): Payer: Self-pay | Admitting: Obstetrics & Gynecology

## 2022-01-11 VITALS — BP 124/68 | HR 55 | Ht 65.0 in | Wt 198.8 lb

## 2022-01-11 DIAGNOSIS — Z170 Estrogen receptor positive status [ER+]: Secondary | ICD-10-CM

## 2022-01-11 DIAGNOSIS — Z01419 Encounter for gynecological examination (general) (routine) without abnormal findings: Secondary | ICD-10-CM

## 2022-01-11 DIAGNOSIS — D251 Intramural leiomyoma of uterus: Secondary | ICD-10-CM

## 2022-01-11 DIAGNOSIS — N898 Other specified noninflammatory disorders of vagina: Secondary | ICD-10-CM | POA: Insufficient documentation

## 2022-01-11 DIAGNOSIS — Z124 Encounter for screening for malignant neoplasm of cervix: Secondary | ICD-10-CM

## 2022-01-11 DIAGNOSIS — D250 Submucous leiomyoma of uterus: Secondary | ICD-10-CM

## 2022-01-11 DIAGNOSIS — Z8742 Personal history of other diseases of the female genital tract: Secondary | ICD-10-CM

## 2022-01-11 DIAGNOSIS — Z8619 Personal history of other infectious and parasitic diseases: Secondary | ICD-10-CM

## 2022-01-11 DIAGNOSIS — R232 Flushing: Secondary | ICD-10-CM

## 2022-01-11 DIAGNOSIS — C50212 Malignant neoplasm of upper-inner quadrant of left female breast: Secondary | ICD-10-CM

## 2022-01-11 NOTE — Patient Instructions (Addendum)
[place] --newest medication Gabapentin -- usually at night and starting at '100mg'$   Paxil -- SSRI that helps hot flashes  Acupuncture by Harlene Salts [place]  [phone]

## 2022-01-11 NOTE — Progress Notes (Signed)
56 y.o. G43P0030 Married [place] American female here for annual exam.  Having issues with hot flashes.  Feels this has gotten worse.  Options for treatment for discussed.  Medications that are options discussed.  Acupuncture discussed.    Denies vaginal bleeding.  Unrelated, pt has some upper abdominal pain.  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  Follow up with urologist was recommended.    Is having some clear vaginal discharge.  Denies odor.   From oncology standpoint, on toremifene.  She is not sure how long she will be on this.  She will discuss this with him at next appt.   No LMP recorded. (Menstrual status: Irregular Periods).          Sexually active: Yes.    The current method of family planning is post menopausal status.    Smoker:  no  Health Maintenance: Pap:  01/01/2021 Negative History of abnormal Pap:  yes, 2013 with ASCUS with +HR HPV MMG:  09/02/2021 MRI of Breast, MMG 02/2021 Colonoscopy:  07/02/2019, f/u 10 years BMD:   plan closer to age 35 Screening Labs:  reviewed labs from 09/2021   reports that she has never smoked. She has never used smokeless tobacco. She reports current alcohol use. She reports that she does not use drugs.  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 [facility])     Past Surgical History:  Procedure Laterality Date   BREAST LUMPECTOMY Left 03/20/2019   BREAST LUMPECTOMY WITH RADIOACTIVE SEED LOCALIZATION Left 03/20/2019   Procedure: LEFT BREAST LUMPECTOMY WITH  RADIOACTIVE SEED LOCALIZATION;  Surgeon: Coralie Keens, MD;  Location: [place] [facility];  Service: General;  Laterality: Left;   ESOPHAGOGASTRODUODENOSCOPY  2020   IR GENERIC HISTORICAL  03/31/2016   IR RADIOLOGIST EVAL & MGMT 03/31/2016 Marybelle Killings, MD [facility] INTERV RAD   IR GENERIC HISTORICAL  01/06/2016   IR RADIOLOGIST EVAL & MGMT 01/06/2016 Marybelle Killings, MD [facility] INTERV RAD   left finger reattachment  age 34   [place]  2003   MYOMECTOMY  2008   via laparoscopy   RE-EXCISION OF BREAST CANCER,SUPERIOR MARGINS Left 04/06/2019   Procedure: RE-EXCISION OF LEFT BREAST CANCER MARGINS;  Surgeon: Coralie Keens, MD;  Location: [facility];  Service: General;  Laterality: Left;    Current Outpatient Medications  Medication Sig Dispense Refill   amLODipine-valsartan (EXFORGE) 10-320 MG tablet Take 1 tablet by mouth daily.      Cholecalciferol (VITAMIN D3) 125 MCG (5000 UT) CAPS daily.     Dermatological Products, Misc. [facility]) lotion EpiCeram topical emulsion, extended release  APPLY TO LEFT CHEST THROUGHOUT THE DAY     folic acid (FOLVITE) 1 MG tablet TAKE 1 TABLET BY MOUTH EVERY DAY 90 tablet 3   hydrochlorothiazide (HYDRODIURIL) 25 MG tablet Take 25 mg by mouth daily.      Toremifene Citrate (  FARESTON) 60 MG tablet Take 1 tablet (60 mg total) by mouth daily. 90 tablet 1   Zinc Sulfate (ZINC 15 PO) Take by mouth daily.     Turmeric (QC TUMERIC COMPLEX PO) Take by mouth daily. (Patient not taking: Reported on 01/11/2022)     vitamin C (ASCORBIC ACID) 500 MG tablet Take 500 mg by mouth daily. (Patient not taking: Reported on 01/11/2022)     No current facility-administered medications for this visit.    Family History  Problem Relation Age of Onset   Diabetes Mother    Hypertension Mother    Heart disease Mother        cardiomegaly   Lupus Mother    Diabetes Father    Hypertension Father    Stroke Father    Hypertension Maternal Grandmother    Stroke  Maternal Grandmother    Hypertension Sister    Seizures Sister    COPD Sister    Ovarian cancer Paternal Aunt     ROS: Constitutional: negative Genitourinary: clear vaginal discharge  Exam:   BP 124/68 (BP Location: Right Arm, Patient Position: Sitting, Cuff Size: Large)   Pulse (!) 55   Ht '5\' 5"'$  (1.651 m) Comment: reported  Wt 198 lb 12.8 oz (90.2 kg)   BMI 33.08 kg/m   Height: '5\' 5"'$  (165.1 cm) (reported)  General appearance: alert, cooperative and appears stated age Head: Normocephalic, without obvious abnormality, atraumatic Neck: no adenopathy, supple, symmetrical, trachea midline and thyroid normal to inspection and palpation Lungs: clear to auscultation bilaterally Breasts: normal appearance, no masses or tenderness Heart: regular rate and rhythm Abdomen: soft, non-tender; bowel sounds normal; no masses,  no organomegaly Extremities: extremities normal, atraumatic, no cyanosis or edema Skin: Skin color, texture, turgor normal. No rashes or lesions Lymph nodes: Cervical, supraclavicular, and axillary nodes normal. No abnormal inguinal nodes palpated Neurologic: Grossly normal   Pelvic: External genitalia:  no lesions              Urethra:  normal appearing urethra with no masses, tenderness or lesions              Bartholins and Skenes: normal                 Vagina: normal appearing vagina with normal color and no discharge, no lesions              Cervix: no lesions              Pap taken: Yes.   Bimanual Exam:  Uterus:  enlarged, about 12 weeks size, globular              Adnexa: no mass, fullness, tenderness               Rectovaginal: Confirms               Anus:  normal sphincter tone, no lesions  Chaperone, Octaviano Batty, CMA, was present for exam.  Assessment/Plan: 1. Well woman exam with routine gynecological exam - Pap smear only obtained. HR HPV neg in 2021. - Mammogram 02/2021 - Colonoscopy 2021 - Bone mineral density not indicated yet - lab work  done done with PCP - vaccines reviewed/updated  2. Hot flashes - options for treatment discussed  3. Vaginal discharge - ancillary swab obtained  4. History of condyloma acuminatum - no findings on exam today  5. History of abnormal cervical Pap smear  6. Malignant neoplasm of upper-inner quadrant of left breast in female,  estrogen receptor positive ([place]) - followed by Dr. Marin Olp  7. Intramural and submucous leiomyoma of uterus - stable  8.  H/O PE

## 2022-01-11 NOTE — Addendum Note (Signed)
Addended by: Megan Salon on: 01/11/2022 09:49 AM   Modules accepted: Orders

## 2022-01-12 LAB — CERVICOVAGINAL ANCILLARY ONLY
Bacterial Vaginitis (gardnerella): NEGATIVE
Candida Glabrata: NEGATIVE
Candida Vaginitis: NEGATIVE
Comment: NEGATIVE
Comment: NEGATIVE
Comment: NEGATIVE

## 2022-01-14 LAB — CYTOLOGY - PAP
Adequacy: ABSENT
Diagnosis: NEGATIVE

## 2022-02-01 ENCOUNTER — Encounter: Payer: Self-pay | Admitting: Family

## 2022-02-01 ENCOUNTER — Other Ambulatory Visit: Payer: Self-pay

## 2022-02-01 ENCOUNTER — Inpatient Hospital Stay (HOSPITAL_BASED_OUTPATIENT_CLINIC_OR_DEPARTMENT_OTHER): Payer: BC Managed Care – PPO | Admitting: Family

## 2022-02-01 ENCOUNTER — Inpatient Hospital Stay: Payer: BC Managed Care – PPO | Attending: Hematology & Oncology

## 2022-02-01 VITALS — BP 141/64 | HR 59 | Temp 98.3°F | Resp 18 | Ht 65.0 in | Wt 197.0 lb

## 2022-02-01 DIAGNOSIS — R232 Flushing: Secondary | ICD-10-CM | POA: Diagnosis not present

## 2022-02-01 DIAGNOSIS — N921 Excessive and frequent menstruation with irregular cycle: Secondary | ICD-10-CM | POA: Diagnosis not present

## 2022-02-01 DIAGNOSIS — R61 Generalized hyperhidrosis: Secondary | ICD-10-CM | POA: Diagnosis not present

## 2022-02-01 DIAGNOSIS — D500 Iron deficiency anemia secondary to blood loss (chronic): Secondary | ICD-10-CM | POA: Insufficient documentation

## 2022-02-01 DIAGNOSIS — C50912 Malignant neoplasm of unspecified site of left female breast: Secondary | ICD-10-CM

## 2022-02-01 DIAGNOSIS — C50212 Malignant neoplasm of upper-inner quadrant of left female breast: Secondary | ICD-10-CM

## 2022-02-01 DIAGNOSIS — Z923 Personal history of irradiation: Secondary | ICD-10-CM | POA: Insufficient documentation

## 2022-02-01 DIAGNOSIS — Z86000 Personal history of in-situ neoplasm of breast: Secondary | ICD-10-CM | POA: Insufficient documentation

## 2022-02-01 DIAGNOSIS — Z79810 Long term (current) use of selective estrogen receptor modulators (SERMs): Secondary | ICD-10-CM | POA: Insufficient documentation

## 2022-02-01 DIAGNOSIS — D0512 Intraductal carcinoma in situ of left breast: Secondary | ICD-10-CM | POA: Insufficient documentation

## 2022-02-01 LAB — CMP (CANCER CENTER ONLY)
ALT: 16 U/L (ref 0–44)
AST: 17 U/L (ref 15–41)
Albumin: 3.9 g/dL (ref 3.5–5.0)
Alkaline Phosphatase: 56 U/L (ref 38–126)
Anion gap: 5 (ref 5–15)
BUN: 16 mg/dL (ref 6–20)
CO2: 31 mmol/L (ref 22–32)
Calcium: 9.7 mg/dL (ref 8.9–10.3)
Chloride: 104 mmol/L (ref 98–111)
Creatinine: 0.83 mg/dL (ref 0.44–1.00)
GFR, Estimated: >60 mL/min (ref >60)
Glucose, Bld: 133 mg/dL — ABNORMAL HIGH (ref 70–99)
Potassium: 3.9 mmol/L (ref 3.5–5.1)
Sodium: 140 mmol/L (ref 135–145)
Total Bilirubin: 0.3 mg/dL (ref 0.3–1.2)
Total Protein: 8.4 g/dL — ABNORMAL HIGH (ref 6.5–8.1)

## 2022-02-01 LAB — CBC WITH DIFFERENTIAL (CANCER CENTER ONLY)
Abs Immature Granulocytes: 0.06 10*3/uL (ref 0.00–0.07)
Basophils Absolute: 0 10*3/uL (ref 0.0–0.1)
Basophils Relative: 1 %
Eosinophils Absolute: 0.1 10*3/uL (ref 0.0–0.5)
Eosinophils Relative: 2 %
HCT: 39.1 % (ref 36.0–46.0)
Hemoglobin: 12 g/dL (ref 12.0–15.0)
Immature Granulocytes: 1 %
Lymphocytes Relative: 35 %
Lymphs Abs: 1.5 10*3/uL (ref 0.7–4.0)
MCH: 28.6 pg (ref 26.0–34.0)
MCHC: 30.7 g/dL (ref 30.0–36.0)
MCV: 93.1 fL (ref 80.0–100.0)
Monocytes Absolute: 0.4 10*3/uL (ref 0.1–1.0)
Monocytes Relative: 9 %
Neutro Abs: 2.2 10*3/uL (ref 1.7–7.7)
Neutrophils Relative %: 52 %
Platelet Count: 225 10*3/uL (ref 150–400)
RBC: 4.2 MIL/uL (ref 3.87–5.11)
RDW: 13.2 % (ref 11.5–15.5)
WBC Count: 4.3 10*3/uL (ref 4.0–10.5)
nRBC: 0 % (ref 0.0–0.2)

## 2022-02-01 LAB — LACTATE DEHYDROGENASE: LDH: 202 U/L — ABNORMAL HIGH (ref 98–192)

## 2022-02-01 NOTE — Progress Notes (Signed)
Hematology and Oncology Follow Up Visit  [name] [id number] Aug 31, 1965 55 y.o. 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 She is doing well but does note hot flashes and night sweats regularly. This has effected her sleep pattern. She prefers to try a natural approach to treatment and plans to try both acupuncture and [place].  She is taking her Fareston daily as prescribed.  Bilateral breast exam today was negative. No mass, lesion or rash noted.  No adenopathy or lymphedema noted on exam.  No fever, chills, n/v, cough, rash, dizziness, SOB, chest pain, palpitations, abdominal pain or changes in bowel or bladder habits.  She has occasional puffiness in her feet that comes and goes.  No falls or syncope reported.  Appetite and hydration have been good. Her weight is stable at 197 lbs.   ECOG Performance Status: 1 - Symptomatic but completely ambulatory  Medications:  Allergies as of 02/01/2022       Reactions   Minocycline Hcl Hives, Itching   Sulfa Antibiotics Swelling   Swell of the lips         Medication List        Accurate as of February 01, 2022 10:19 AM. If you have any questions, ask your nurse or doctor.          amLODipine-valsartan 10-320 MG tablet Commonly known as: EXFORGE Take 1 tablet by mouth daily.   ascorbic acid 500 MG tablet Commonly known as: VITAMIN C Take 500 mg by mouth daily.   EpiCeram lotion EpiCeram topical emulsion, extended release  APPLY TO LEFT CHEST THROUGHOUT THE DAY   folic acid 1 MG tablet Commonly known as: FOLVITE TAKE 1 TABLET BY MOUTH EVERY DAY   hydrochlorothiazide 25  MG tablet Commonly known as: HYDRODIURIL Take 25 mg by mouth daily.   QC TUMERIC COMPLEX PO Take by mouth daily.   Toremifene Citrate 60 MG tablet Commonly known as: FARESTON Take 1 tablet (60 mg total) by mouth daily.   Vitamin D3 125 MCG (5000 UT) Caps daily.   ZINC 15 PO Take by mouth daily.        Allergies:  Allergies  Allergen Reactions   Minocycline Hcl Hives and Itching   Sulfa Antibiotics Swelling    Swell of the lips     Past Medical History, Surgical history, Social history, and Family History were reviewed and updated.  Review of Systems: All other 10 point review of systems is negative.   Physical Exam:  vitals were not taken for this visit.   Wt Readings from Last 3 Encounters:  01/11/22 198 lb 12.8 oz (90.2 kg)  10/05/21 199 lb 1.9 oz (90.3 kg)  06/22/21 199 lb 12.8 oz (90.6 kg)    Ocular: Sclerae unicteric, pupils equal, round and reactive to light Ear-nose-throat: Oropharynx clear, dentition fair Lymphatic: No cervical, supraclavicular or axillary adenopathy Lungs no rales or rhonchi, good excursion bilaterally Heart regular rate and rhythm, no murmur appreciated Abd soft, nontender, positive bowel sounds MSK no focal spinal tenderness, no joint edema Neuro: non-focal, well-oriented, appropriate affect Breasts: Bilateral breast exam negative. No mass, lesion or rash noted.   Lab Results  Component Value Date   WBC 4.3 02/01/2022   HGB 12.0 02/01/2022   HCT 39.1 02/01/2022   MCV 93.1 02/01/2022   PLT 225 02/01/2022   Lab Results  Component Value Date   FERRITIN 45 02/02/2021   IRON 65 02/02/2021   TIBC 254 02/02/2021   UIBC 189 02/02/2021   IRONPCTSAT 26 02/02/2021   Lab Results  Component Value Date   RETICCTPCT 2.5 04/23/2019   RBC 4.20 02/01/2022   No results found for: "KPAFRELGTCHN", "LAMBDASER", "KAPLAMBRATIO" No results found for: "IGGSERUM", "IGA", "IGMSERUM" No results found for: "TOTALPROTELP", "ALBUMINELP", "A1GS",  "A2GS", "BETS", "BETA2SER", "GAMS", "MSPIKE", "SPEI"   Chemistry      Component Value Date/Time   NA 140 10/05/2021 0800   NA 142 04/12/2017 0858   K 3.6 10/05/2021 0800   K 3.8 04/12/2017 0858   CL 106 10/05/2021 0800   CL 104 04/12/2017 0858   CO2 29 10/05/2021 0800   CO2 26 04/12/2017 0858   BUN 16 10/05/2021 0800   BUN 9 04/12/2017 0858   CREATININE 0.76 10/05/2021 0800   CREATININE 0.8 04/12/2017 0858      Component Value Date/Time   CALCIUM 9.3 10/05/2021 0800   CALCIUM 9.2 04/12/2017 0858   ALKPHOS 57 10/05/2021 0800   ALKPHOS 74 04/12/2017 0858   AST 17 10/05/2021 0800   ALT 17 10/05/2021 0800   ALT 26 04/12/2017 0858   BILITOT 0.3 10/05/2021 0800       Impression and Plan: [name] is a 56 yo African American female with history of ductal carcinoma in situ of the left breast with microinvasion.  We will see if her hormone studies show that she is postmenopausal and we will get her switched over to an aromatase inhibitor.  Follow-up in 4 months.   Lottie Dawson, NP 9/18/202310:19 AM

## 2022-02-09 ENCOUNTER — Telehealth: Payer: Self-pay | Admitting: *Deleted

## 2022-02-09 NOTE — Telephone Encounter (Signed)
Per 02/09/22 los - called and lvm of upcoming appointments - requested callback to confirm

## 2022-03-01 ENCOUNTER — Other Ambulatory Visit: Payer: Self-pay | Admitting: Hematology & Oncology

## 2022-03-01 DIAGNOSIS — Z1231 Encounter for screening mammogram for malignant neoplasm of breast: Secondary | ICD-10-CM

## 2022-04-15 ENCOUNTER — Other Ambulatory Visit: Payer: Self-pay | Admitting: Hematology & Oncology

## 2022-04-15 ENCOUNTER — Ambulatory Visit
Admission: RE | Admit: 2022-04-15 | Discharge: 2022-04-15 | Disposition: A | Discharge status: Home / Self Care | Payer: BC Managed Care – PPO | Source: Ambulatory Visit | Attending: Hematology & Oncology | Admitting: Hematology & Oncology

## 2022-04-15 DIAGNOSIS — Z9889 Other specified postprocedural states: Secondary | ICD-10-CM

## 2022-04-15 DIAGNOSIS — Z1231 Encounter for screening mammogram for malignant neoplasm of breast: Secondary | ICD-10-CM

## 2022-04-27 ENCOUNTER — Other Ambulatory Visit: Payer: Self-pay | Admitting: Hematology & Oncology

## 2022-04-27 DIAGNOSIS — C50912 Malignant neoplasm of unspecified site of left female breast: Secondary | ICD-10-CM

## 2022-04-28 ENCOUNTER — Ambulatory Visit
Admission: RE | Admit: 2022-04-28 | Discharge: 2022-04-28 | Disposition: A | Discharge status: Home / Self Care | Payer: BC Managed Care – PPO | Source: Ambulatory Visit | Attending: Hematology & Oncology | Admitting: Hematology & Oncology

## 2022-04-28 DIAGNOSIS — Z9889 Other specified postprocedural states: Secondary | ICD-10-CM

## 2022-05-17 ENCOUNTER — Ambulatory Visit
Admission: EM | Admit: 2022-05-17 | Discharge: 2022-05-17 | Disposition: A | Discharge status: Home / Self Care | Payer: BC Managed Care – PPO | Attending: Urgent Care | Admitting: Urgent Care

## 2022-05-17 DIAGNOSIS — H6992 Unspecified Eustachian tube disorder, left ear: Secondary | ICD-10-CM

## 2022-05-17 DIAGNOSIS — I10 Essential (primary) hypertension: Secondary | ICD-10-CM | POA: Diagnosis not present

## 2022-05-17 MED ORDER — FLUTICASONE PROPIONATE 50 MCG/ACT NA SUSP: 2.0000 | Freq: Every day | NASAL | 12 refills | Status: DC

## 2022-05-17 MED ORDER — CETIRIZINE HCL 10 MG PO TABS: 10.0000 mg | ORAL_TABLET | Freq: Every day | ORAL | 0 refills | Status: DC

## 2022-05-17 MED ORDER — AMOXICILLIN 875 MG PO TABS: 875.0000 mg | ORAL_TABLET | Freq: Two times a day (BID) | ORAL | 0 refills | Status: DC

## 2022-05-17 MED ORDER — PSEUDOEPHEDRINE HCL 30 MG PO TABS: 30.0000 mg | ORAL_TABLET | Freq: Three times a day (TID) | ORAL | 0 refills | Status: DC | PRN

## 2022-05-17 NOTE — ED Triage Notes (Signed)
Pt states left ear pain since yesterday.

## 2022-05-17 NOTE — ED Provider Notes (Signed)
[facility]  Note:  This document was prepared using Systems analyst and may include unintentional dictation errors.  MRN: [id number] DOB: 05/01/1966  Subjective:   [name] is a 57 y.o. female presenting for 1 day history of acute onset intermittent left ear pain, popping and disequilibrium.  Patient has been flying a lot lately.  Has a history of ear infections but has not had one in a few years.  No fever, drainage, tinnitus, runny or stuffy nose, sore throat, cough.  No current facility-administered medications for this encounter.  Current Outpatient Medications:    amLODipine-valsartan (EXFORGE) 10-320 MG tablet, Take 1 tablet by mouth daily. , Disp: , Rfl:    Cholecalciferol (VITAMIN D3) 125 MCG (5000 UT) CAPS, daily., Disp: , Rfl:    Dermatological Products, Misc. (EPICERAM) lotion, EpiCeram topical emulsion, extended release  APPLY TO LEFT CHEST THROUGHOUT THE DAY, Disp: , Rfl:    folic acid (FOLVITE) 1 MG tablet, TAKE 1 TABLET BY MOUTH EVERY DAY, Disp: 90 tablet, Rfl: 3   hydrochlorothiazide (HYDRODIURIL) 25 MG tablet, Take 25 mg by mouth daily. , Disp: , Rfl:    Toremifene Citrate (FARESTON) 60 MG tablet, TAKE 1 TABLET BY MOUTH EVERY DAY, Disp: 90 tablet, Rfl: 3   Turmeric (QC TUMERIC COMPLEX PO), Take by mouth daily., Disp: , Rfl:    vitamin C (ASCORBIC ACID) 500 MG tablet, Take 500 mg by mouth daily., Disp: , Rfl:    Zinc Sulfate (ZINC 15 PO), Take by mouth daily., Disp: , Rfl:    Allergies  Allergen Reactions   Minocycline Hcl Hives and Itching   Sulfa Antibiotics Swelling    Swell of the lips     Past Medical History:  Diagnosis Date   Abnormal Pap smear 7/13   ASCUS/positive HR HPV, negative colpo   Anemia 2006   Breast cancer ([place]) 2020   Left Breast Cancer   Cancer ([place])    Breast- left   Condylomata acuminata in female    Family history of cancer    Fibroids    with enlarged uterus   GERD  (gastroesophageal reflux disease)    Hypertension    Personal history of radiation therapy 2020   Left Breast Cancer   Pulmonary embolism (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 [facility])      Past Surgical History:  Procedure Laterality Date   BREAST LUMPECTOMY Left 03/20/2019   BREAST LUMPECTOMY WITH RADIOACTIVE SEED LOCALIZATION Left 03/20/2019   Procedure: LEFT BREAST LUMPECTOMY WITH RADIOACTIVE SEED LOCALIZATION;  Surgeon: Coralie Keens, MD;  Location: [place];  Service: General;  Laterality: Left;   ESOPHAGOGASTRODUODENOSCOPY  2020   IR GENERIC HISTORICAL  03/31/2016   IR RADIOLOGIST EVAL & MGMT 03/31/2016 Marybelle Killings, MD [facility] INTERV RAD   IR GENERIC HISTORICAL  01/06/2016   IR RADIOLOGIST EVAL & MGMT 01/06/2016 Marybelle Killings, MD [facility] INTERV RAD   left finger reattachment  age 52   [place]  2003   MYOMECTOMY  2008   via laparoscopy   RE-EXCISION OF BREAST CANCER,SUPERIOR MARGINS Left 04/06/2019   Procedure: RE-EXCISION OF LEFT [place];  Surgeon: Coralie Keens, MD;  Location: [place];  Service: General;  Laterality: Left;    Family History  Problem Relation Age of Onset   Diabetes Mother    Hypertension Mother    Heart disease Mother  cardiomegaly   Lupus Mother    Diabetes Father    Hypertension Father    Stroke Father    Hypertension Maternal Grandmother    Stroke Maternal Grandmother    Hypertension Sister    Seizures Sister    COPD Sister    Ovarian cancer Paternal Aunt     Social History   Tobacco Use   Smoking status: Never   Smokeless tobacco: Never  Vaping Use   Vaping Use: Never used  Substance Use Topics   Alcohol use: Yes    Alcohol/week: 0.0 - 1.0 standard drinks of alcohol    Comment: occasional wine   Drug use: No    ROS   Objective:   Vitals: BP (!) 159/85 (BP Location: Right Arm)   Pulse 71   Temp 98.9 F (37.2 C) (Oral)   Resp 16    SpO2 95%   BP Readings from Last 3 Encounters:  05/17/22 (!) 159/85  02/01/22 (!) 141/64  01/11/22 124/68   Physical Exam Constitutional:      General: She is not in acute distress.    Appearance: Normal appearance. She is well-developed. She is not ill-appearing, toxic-appearing or diaphoretic.  HENT:     Head: Normocephalic and atraumatic.     Right Ear: Tympanic membrane, ear canal and external ear normal. No tenderness. There is no impacted cerumen. Tympanic membrane is not injected, perforated, erythematous or bulging.     Left Ear: Tympanic membrane, ear canal and external ear normal. No tenderness. There is no impacted cerumen. Tympanic membrane is not injected, perforated, erythematous or bulging.     Nose: Nose normal.     Mouth/Throat:     Mouth: Mucous membranes are moist.  Eyes:     General: No scleral icterus.       Right eye: No discharge.        Left eye: No discharge.     Extraocular Movements: Extraocular movements intact.  Cardiovascular:     Rate and Rhythm: Normal rate.  Pulmonary:     Effort: Pulmonary effort is normal.  Skin:    General: Skin is warm and dry.  Neurological:     General: No focal deficit present.     Mental Status: She is alert and oriented to person, place, and time.  Psychiatric:        Mood and Affect: Mood normal.        Behavior: Behavior normal.    Assessment and Plan :   PDMP not reviewed this encounter.  1. Eustachian tube dysfunction, left   2. Essential hypertension     Unremarkable ENT exam.  Will use conservative management for what I suspect is eustachian tube dysfunction.  Recommended starting Flonase, Zyrtec, Sudafed.  If patient remains symptomatic in the next 4 to 5 days, I did offer her a printed prescription for amoxicillin to address a secondary otitis media.  Otherwise she is to discard the prescription.  Counseled patient on potential for adverse effects with medications prescribed/recommended today, ER and  return-to-clinic precautions discussed, patient verbalized understanding.    Jaynee Eagles, [place] 05/17/22 1453

## 2022-06-04 ENCOUNTER — Inpatient Hospital Stay: Payer: BC Managed Care – PPO | Admitting: Family

## 2022-06-04 ENCOUNTER — Inpatient Hospital Stay: Payer: BC Managed Care – PPO

## 2022-06-21 ENCOUNTER — Other Ambulatory Visit: Payer: Self-pay

## 2022-06-21 ENCOUNTER — Inpatient Hospital Stay: Payer: BC Managed Care – PPO | Admitting: Family

## 2022-06-21 ENCOUNTER — Encounter: Payer: Self-pay | Admitting: Family

## 2022-06-21 ENCOUNTER — Inpatient Hospital Stay: Payer: BC Managed Care – PPO | Attending: Hematology & Oncology

## 2022-06-21 VITALS — BP 129/73 | HR 82 | Temp 98.4°F | Resp 18 | Ht 65.0 in | Wt 202.8 lb

## 2022-06-21 DIAGNOSIS — D0512 Intraductal carcinoma in situ of left breast: Secondary | ICD-10-CM | POA: Insufficient documentation

## 2022-06-21 DIAGNOSIS — Z170 Estrogen receptor positive status [ER+]: Secondary | ICD-10-CM | POA: Diagnosis not present

## 2022-06-21 DIAGNOSIS — D500 Iron deficiency anemia secondary to blood loss (chronic): Secondary | ICD-10-CM

## 2022-06-21 DIAGNOSIS — C50912 Malignant neoplasm of unspecified site of left female breast: Secondary | ICD-10-CM

## 2022-06-21 DIAGNOSIS — C50212 Malignant neoplasm of upper-inner quadrant of left female breast: Secondary | ICD-10-CM | POA: Diagnosis not present

## 2022-06-21 DIAGNOSIS — N921 Excessive and frequent menstruation with irregular cycle: Secondary | ICD-10-CM | POA: Insufficient documentation

## 2022-06-21 DIAGNOSIS — Z923 Personal history of irradiation: Secondary | ICD-10-CM | POA: Diagnosis not present

## 2022-06-21 LAB — CMP (CANCER CENTER ONLY)
ALT: 18 U/L (ref 0–44)
AST: 17 U/L (ref 15–41)
Albumin: 3.9 g/dL (ref 3.5–5.0)
Alkaline Phosphatase: 62 U/L (ref 38–126)
Anion gap: 6 (ref 5–15)
BUN: 13 mg/dL (ref 6–20)
CO2: 30 mmol/L (ref 22–32)
Calcium: 9.7 mg/dL (ref 8.9–10.3)
Chloride: 105 mmol/L (ref 98–111)
Creatinine: 0.96 mg/dL (ref 0.44–1.00)
GFR, Estimated: >60 mL/min (ref >60)
Glucose, Bld: 113 mg/dL — ABNORMAL HIGH (ref 70–99)
Potassium: 3.8 mmol/L (ref 3.5–5.1)
Sodium: 141 mmol/L (ref 135–145)
Total Bilirubin: 0.3 mg/dL (ref 0.3–1.2)
Total Protein: 8.4 g/dL — ABNORMAL HIGH (ref 6.5–8.1)

## 2022-06-21 LAB — CBC WITH DIFFERENTIAL (CANCER CENTER ONLY)
Abs Immature Granulocytes: 0.1 10*3/uL — ABNORMAL HIGH (ref 0.00–0.07)
Basophils Absolute: 0 10*3/uL (ref 0.0–0.1)
Basophils Relative: 1 %
Eosinophils Absolute: 0.1 10*3/uL (ref 0.0–0.5)
Eosinophils Relative: 1 %
HCT: 39.1 % (ref 36.0–46.0)
Hemoglobin: 11.9 g/dL — ABNORMAL LOW (ref 12.0–15.0)
Immature Granulocytes: 2 %
Lymphocytes Relative: 31 %
Lymphs Abs: 1.7 10*3/uL (ref 0.7–4.0)
MCH: 28.6 pg (ref 26.0–34.0)
MCHC: 30.4 g/dL (ref 30.0–36.0)
MCV: 94 fL (ref 80.0–100.0)
Monocytes Absolute: 0.5 10*3/uL (ref 0.1–1.0)
Monocytes Relative: 10 %
Neutro Abs: 2.9 10*3/uL (ref 1.7–7.7)
Neutrophils Relative %: 55 %
Platelet Count: 237 10*3/uL (ref 150–400)
RBC: 4.16 MIL/uL (ref 3.87–5.11)
RDW: 12.9 % (ref 11.5–15.5)
WBC Count: 5.3 10*3/uL (ref 4.0–10.5)
nRBC: 0 % (ref 0.0–0.2)

## 2022-06-21 LAB — LACTATE DEHYDROGENASE: LDH: 211 U/L — ABNORMAL HIGH (ref 98–192)

## 2022-06-21 NOTE — Progress Notes (Signed)
Hematology and Oncology Follow Up Visit  [name] [id number] Dec 05, 1965 57 y.o. 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 She had some left arm pain and numbness/tingling in the left hands that comes and goes. No swelling in the arms.  She does week out regularly. No trauma/injury Bilateral breast exam negative. No mass, lesion or rash noted.  No adenopathy noted on exam. No lymphedema.   She has occasional puffiness in her ankles that comes and goes.  No falls or syncope.  She feels that she is starting to go through the changes of life. Hormone lab work is pending.  No fever, chills, n/v, cough, rash, dizziness, SOB, chest pain, palpitations or changes in bowel or bladder habits.  She has had some abdominal discomfort and is being followed closely by GI Dr. Verdia Kuba.  No blood loss noted. No bruising or petechiae.  Appetite and hydration have been good. Weight is 202 lbs.   ECOG Performance Status: 1 - Symptomatic but completely ambulatory  Medications:  Allergies as of 06/21/2022       Reactions   Minocycline Hcl Hives, Itching   Sulfa Antibiotics Swelling   Swell of the lips         Medication List        Accurate as of June 21, 2022  2:27 PM. If you have any questions, ask your nurse or doctor.          amLODipine-valsartan 10-320 MG tablet Commonly known as: EXFORGE Take 1 tablet by mouth daily.   amoxicillin 875 MG tablet Commonly known as: AMOXIL Take 1 tablet (875 mg total) by mouth 2 (two) times daily.   ascorbic acid 500 MG tablet Commonly known as: VITAMIN C Take 500 mg by mouth daily.   cetirizine 10 MG  tablet Commonly known as: ZyrTEC Allergy Take 1 tablet (10 mg total) by mouth daily.   EpiCeram lotion EpiCeram topical emulsion, extended release  APPLY TO LEFT CHEST THROUGHOUT THE DAY   fluticasone 50 MCG/ACT nasal spray Commonly known as: FLONASE Place 2 sprays into both nostrils daily.   folic acid 1 MG tablet Commonly known as: FOLVITE TAKE 1 TABLET BY MOUTH EVERY DAY   hydrochlorothiazide 25 MG tablet Commonly known as: HYDRODIURIL Take 25 mg by mouth daily.   pseudoephedrine 30 MG tablet Commonly known as: SUDAFED Take 1 tablet (30 mg total) by mouth every 8 (eight) hours as needed for congestion.   QC TUMERIC COMPLEX PO Take by mouth daily.   Toremifene Citrate 60 MG tablet Commonly known as: FARESTON TAKE 1 TABLET BY MOUTH EVERY DAY   Vitamin D3 125 MCG (5000 UT) Caps daily.   ZINC 15 PO Take by mouth daily.        Allergies:  Allergies  Allergen Reactions   Minocycline Hcl Hives and Itching   Sulfa Antibiotics Swelling    Swell of the lips     Past Medical History, Surgical history, Social history, and Family History were reviewed and updated.  Review of Systems: All other 10 point review of systems is negative.   Physical Exam:  vitals were not taken for this visit.   Wt  Readings from Last 3 Encounters:  02/01/22 197 lb (89.4 kg)  01/11/22 198 lb 12.8 oz (90.2 kg)  10/05/21 199 lb 1.9 oz (90.3 kg)    Ocular: Sclerae unicteric, pupils equal, round and reactive to light Ear-nose-throat: Oropharynx clear, dentition fair Lymphatic: No cervical, supraclavicular or axillary adenopathy Lungs no rales or rhonchi, good excursion bilaterally Heart regular rate and rhythm, no murmur appreciated Abd soft, nontender, positive bowel sounds MSK no focal spinal tenderness, no joint edema Neuro: non-focal, well-oriented, appropriate affect Breasts: No mass, lesion or rash noted. Bilateral exam negative.   Lab Results  Component Value Date   WBC  4.3 02/01/2022   HGB 12.0 02/01/2022   HCT 39.1 02/01/2022   MCV 93.1 02/01/2022   PLT 225 02/01/2022   Lab Results  Component Value Date   FERRITIN 45 02/02/2021   IRON 65 02/02/2021   TIBC 254 02/02/2021   UIBC 189 02/02/2021   IRONPCTSAT 26 02/02/2021   Lab Results  Component Value Date   RETICCTPCT 2.5 04/23/2019   RBC 4.20 02/01/2022   No results found for: "KPAFRELGTCHN", "LAMBDASER", "KAPLAMBRATIO" No results found for: "IGGSERUM", "IGA", "IGMSERUM" No results found for: "TOTALPROTELP", "ALBUMINELP", "A1GS", "A2GS", "BETS", "BETA2SER", "GAMS", "MSPIKE", "SPEI"   Chemistry      Component Value Date/Time   NA 140 02/01/2022 0945   NA 142 04/12/2017 0858   K 3.9 02/01/2022 0945   K 3.8 04/12/2017 0858   CL 104 02/01/2022 0945   CL 104 04/12/2017 0858   CO2 31 02/01/2022 0945   CO2 26 04/12/2017 0858   BUN 16 02/01/2022 0945   BUN 9 04/12/2017 0858   CREATININE 0.83 02/01/2022 0945   CREATININE 0.8 04/12/2017 0858      Component Value Date/Time   CALCIUM 9.7 02/01/2022 0945   CALCIUM 9.2 04/12/2017 0858   ALKPHOS 56 02/01/2022 0945   ALKPHOS 74 04/12/2017 0858   AST 17 02/01/2022 0945   ALT 16 02/01/2022 0945   ALT 26 04/12/2017 0858   BILITOT 0.3 02/01/2022 0945       Impression and Plan: [name] is a 57 yo African American female with history of ductal carcinoma in situ of the left breast with microinvasion.  Hormone studies are pending. Once she is postmenopausal we will get her switched over to an aromatase inhibitor.  Follow-up in 4 months.   Lottie Dawson, NP 2/5/20242:27 PM

## 2022-06-22 LAB — LUTEINIZING HORMONE: LH: 9.8 m[IU]/mL

## 2022-06-22 LAB — FOLLICLE STIMULATING HORMONE: FSH: 15.1 m[IU]/mL

## 2022-06-24 LAB — ESTRADIOL, ULTRA SENS: Estradiol, Sensitive: 11.9 pg/mL

## 2022-08-11 ENCOUNTER — Other Ambulatory Visit: Payer: Self-pay | Admitting: Hematology & Oncology

## 2022-10-18 ENCOUNTER — Inpatient Hospital Stay (HOSPITAL_BASED_OUTPATIENT_CLINIC_OR_DEPARTMENT_OTHER): Payer: BC Managed Care – PPO | Admitting: Hematology & Oncology

## 2022-10-18 ENCOUNTER — Encounter: Payer: Self-pay | Admitting: Hematology & Oncology

## 2022-10-18 ENCOUNTER — Inpatient Hospital Stay: Payer: BC Managed Care – PPO | Attending: Hematology & Oncology

## 2022-10-18 VITALS — BP 139/81 | HR 87 | Temp 98.6°F | Resp 17 | Wt 205.4 lb

## 2022-10-18 DIAGNOSIS — C50212 Malignant neoplasm of upper-inner quadrant of left female breast: Secondary | ICD-10-CM | POA: Diagnosis not present

## 2022-10-18 DIAGNOSIS — C50912 Malignant neoplasm of unspecified site of left female breast: Secondary | ICD-10-CM

## 2022-10-18 DIAGNOSIS — D0512 Intraductal carcinoma in situ of left breast: Secondary | ICD-10-CM | POA: Diagnosis not present

## 2022-10-18 DIAGNOSIS — Z170 Estrogen receptor positive status [ER+]: Secondary | ICD-10-CM

## 2022-10-18 DIAGNOSIS — M1990 Unspecified osteoarthritis, unspecified site: Secondary | ICD-10-CM | POA: Diagnosis not present

## 2022-10-18 DIAGNOSIS — D500 Iron deficiency anemia secondary to blood loss (chronic): Secondary | ICD-10-CM | POA: Diagnosis present

## 2022-10-18 DIAGNOSIS — N921 Excessive and frequent menstruation with irregular cycle: Secondary | ICD-10-CM | POA: Diagnosis not present

## 2022-10-18 LAB — CMP (CANCER CENTER ONLY)
ALT: 16 U/L (ref 0–44)
AST: 18 U/L (ref 15–41)
Albumin: 3.6 g/dL (ref 3.5–5.0)
Alkaline Phosphatase: 52 U/L (ref 38–126)
Anion gap: 5 (ref 5–15)
BUN: 16 mg/dL (ref 6–20)
CO2: 29 mmol/L (ref 22–32)
Calcium: 9.3 mg/dL (ref 8.9–10.3)
Chloride: 107 mmol/L (ref 98–111)
Creatinine: 0.7 mg/dL (ref 0.44–1.00)
GFR, Estimated: >60 mL/min (ref >60)
Glucose, Bld: 102 mg/dL — ABNORMAL HIGH (ref 70–99)
Potassium: 3.7 mmol/L (ref 3.5–5.1)
Sodium: 141 mmol/L (ref 135–145)
Total Bilirubin: 0.3 mg/dL (ref 0.3–1.2)
Total Protein: 7.8 g/dL (ref 6.5–8.1)

## 2022-10-18 LAB — CBC WITH DIFFERENTIAL (CANCER CENTER ONLY)
Abs Immature Granulocytes: 0.09 10*3/uL — ABNORMAL HIGH (ref 0.00–0.07)
Basophils Absolute: 0 10*3/uL (ref 0.0–0.1)
Basophils Relative: 1 %
Eosinophils Absolute: 0.1 10*3/uL (ref 0.0–0.5)
Eosinophils Relative: 2 %
HCT: 36.8 % (ref 36.0–46.0)
Hemoglobin: 11.6 g/dL — ABNORMAL LOW (ref 12.0–15.0)
Immature Granulocytes: 2 %
Lymphocytes Relative: 39 %
Lymphs Abs: 1.9 10*3/uL (ref 0.7–4.0)
MCH: 28.9 pg (ref 26.0–34.0)
MCHC: 31.5 g/dL (ref 30.0–36.0)
MCV: 91.5 fL (ref 80.0–100.0)
Monocytes Absolute: 0.3 10*3/uL (ref 0.1–1.0)
Monocytes Relative: 7 %
Neutro Abs: 2.4 10*3/uL (ref 1.7–7.7)
Neutrophils Relative %: 49 %
Platelet Count: 220 10*3/uL (ref 150–400)
RBC: 4.02 MIL/uL (ref 3.87–5.11)
RDW: 13.1 % (ref 11.5–15.5)
WBC Count: 4.8 10*3/uL (ref 4.0–10.5)
nRBC: 0 % (ref 0.0–0.2)

## 2022-10-18 LAB — FERRITIN: Ferritin: 23 ng/mL (ref 11–307)

## 2022-10-18 LAB — IRON AND IRON BINDING CAPACITY (CC-WL,HP ONLY)
Iron: 73 ug/dL (ref 28–170)
Saturation Ratios: 26 % (ref 10.4–31.8)
TIBC: 283 ug/dL (ref 250–450)
UIBC: 210 ug/dL (ref 148–442)

## 2022-10-18 LAB — LACTATE DEHYDROGENASE: LDH: 201 U/L — ABNORMAL HIGH (ref 98–192)

## 2022-10-18 MED ORDER — TOREMIFENE CITRATE 60 MG PO TABS: 60.0000 mg | ORAL_TABLET | Freq: Every day | ORAL | 3 refills | Status: DC

## 2022-10-18 NOTE — Progress Notes (Signed)
Hematology and Oncology Follow Up Visit  [name] [id number] 09/24/1965 57 y.o. 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 daily-start on 07/08/2018 Radiation therapy -- completed 5200 rad - on 06/20/2019     Interim History:  [name] is back for follow-up.  We see her every 3 to 4 months.  We last saw back in February.  Since then, she has been doing pretty well.  She comes in with her husband.  They are going go to [place] in early July.  I am sure that they will have a wonderful time there.  There has been no problems with nausea or vomiting.  She is on oral iron.  We will have to see what her iron levels look like today.    She has had no problems with the Fareston.  There is been no bony aches or pains.  She is having MRI ordered.  Will order the MRI.  She has had no problems with bowels or bladder.  There is been no bleeding.  She has had no rashes.  She has had no fever.  Thankfully, there is been no problems with COVID.     Medications:  Current Outpatient Medications:    amLODipine-valsartan (EXFORGE) 10-320 MG tablet, Take 1 tablet by mouth daily. , Disp: , Rfl:    Dermatological Products, Misc. (EPICERAM) lotion, EpiCeram topical emulsion, extended release  APPLY TO LEFT CHEST THROUGHOUT THE DAY, Disp: , Rfl:    folic acid (FOLVITE) 1 MG tablet, TAKE 1 TABLET BY MOUTH EVERY DAY, Disp: 90 tablet, Rfl: 3   hydrochlorothiazide (HYDRODIURIL) 25 MG tablet, Take 25 mg by mouth daily. , Disp: , Rfl:    Toremifene Citrate (FARESTON) 60 MG tablet, TAKE 1 TABLET BY MOUTH EVERY DAY, Disp: 90 tablet, Rfl: 3   Turmeric (QC TUMERIC COMPLEX PO), Take by mouth daily., Disp: , Rfl:    vitamin C (ASCORBIC ACID) 500 MG tablet, Take 500 mg by mouth daily., Disp: , Rfl:    Zinc Sulfate (ZINC 15  PO), Take by mouth daily., Disp: , Rfl:    cetirizine (ZYRTEC ALLERGY) 10 MG tablet, Take 1 tablet (10 mg total) by mouth daily. (Patient not taking: Reported on 06/21/2022), Disp: 30 tablet, Rfl: 0   Cholecalciferol (VITAMIN D3) 125 MCG (5000 UT) CAPS, daily. (Patient not taking: Reported on 10/18/2022), Disp: , Rfl:    fluticasone (FLONASE) 50 MCG/ACT nasal spray, Place 2 sprays into both nostrils daily. (Patient not taking: Reported on 06/21/2022), Disp: 16 g, Rfl: 12   pseudoephedrine (SUDAFED) 30 MG tablet, Take 1 tablet (30 mg total) by mouth every 8 (eight) hours as needed for congestion. (Patient not taking: Reported on 06/21/2022), Disp: 30 tablet, Rfl: 0  Allergies:  Allergies  Allergen Reactions   Minocycline Hcl Hives and Itching   Sulfa Antibiotics Swelling    Swell of the lips     Past Medical History, Surgical history, Social history, and Family History were reviewed and updated.  Review of Systems: Review of Systems  Constitutional: Negative.   HENT:  Negative.    Eyes: Negative.   Respiratory: Negative.    Cardiovascular: Negative.   Gastrointestinal: Negative.   Endocrine: Negative.   Genitourinary: Negative.    Musculoskeletal: Negative.   Skin: Negative.   Neurological: Negative.   Hematological:  Negative.   Psychiatric/Behavioral: Negative.      Physical Exam:  weight is 205 lb 6.4 oz (93.2 kg). Her oral temperature is 98.6 F (37 C). Her blood pressure is 139/81 and her pulse is 87. Her respiration is 17 and oxygen saturation is 100%.   Wt Readings from Last 3 Encounters:  10/18/22 205 lb 6.4 oz (93.2 kg)  06/21/22 202 lb 12.8 oz (92 kg)  02/01/22 197 lb (89.4 kg)    Physical Exam Vitals reviewed.  Constitutional:      Comments: On her breast exam, right breast was without masses, edema or erythema.  There is no right axillary adenopathy.  Left breast is quite hyperpigmented from radiation.  There is some healing areas of skin from the radiation.  There is  some contraction from radiation and  surgery.  She has the lumpectomy scar on the areola at about the 11 o'clock position.  There is no erythema.  There is no fullness.  There is no distinct mass in the left breast.  There is no left axillary adenopathy.  HENT:     Head: Normocephalic and atraumatic.  Eyes:     Pupils: Pupils are equal, round, and reactive to light.  Cardiovascular:     Rate and Rhythm: Normal rate and regular rhythm.     Heart sounds: Normal heart sounds.  Pulmonary:     Effort: Pulmonary effort is normal.     Breath sounds: Normal breath sounds.  Abdominal:     General: Bowel sounds are normal.     Palpations: Abdomen is soft.  Musculoskeletal:        General: No tenderness or deformity. Normal range of motion.     Cervical back: Normal range of motion.  Lymphadenopathy:     Cervical: No cervical adenopathy.  Skin:    General: Skin is warm and dry.     Findings: No erythema or rash.  Neurological:     Mental Status: She is alert and oriented to person, place, and time.  Psychiatric:        Behavior: Behavior normal.        Thought Content: Thought content normal.        Judgment: Judgment normal.      Lab Results  Component Value Date   WBC 4.8 10/18/2022   HGB 11.6 (L) 10/18/2022   HCT 36.8 10/18/2022   MCV 91.5 10/18/2022   PLT 220 10/18/2022     Chemistry      Component Value Date/Time   NA 141 06/21/2022 1412   NA 142 04/12/2017 0858   K 3.8 06/21/2022 1412   K 3.8 04/12/2017 0858   CL 105 06/21/2022 1412   CL 104 04/12/2017 0858   CO2 30 06/21/2022 1412   CO2 26 04/12/2017 0858   BUN 13 06/21/2022 1412   BUN 9 04/12/2017 0858   CREATININE 0.96 06/21/2022 1412   CREATININE 0.8 04/12/2017 0858      Component Value Date/Time   CALCIUM 9.7 06/21/2022 1412   CALCIUM 9.2 04/12/2017 0858   ALKPHOS 62 06/21/2022 1412   ALKPHOS 74 04/12/2017 0858   AST 17 06/21/2022 1412   ALT 18 06/21/2022 1412   ALT 26 04/12/2017 0858   BILITOT 0.3  06/21/2022 1412      Impression and Plan: [name] is a 57 year old African-American female.  She now has ductal carcinoma in situ of the left breast with microinvasion.    We have her on Fareston.  I think  he is doing well on the Fareston.  She does have problems with arthritis so we had to be careful with switching over to an aromatase inhibitor.  For right now, we will just had to get her back in another 6 months.  We will get the MRI of the breast in about 3 to 4 weeks.    I am so happy that she is enjoying her new job.  I know she will do very well with this.  We will have to see what her menopausal status is.  Hopefully, we will be able to switch over to an aromatase inhibitor.  We will go ahead and plan to get her back after Labor Day Day.  We will get her through all of summer.      Josph Macho, MD 6/3/20248:09 AM

## 2022-10-18 NOTE — Addendum Note (Signed)
Addended by: Arlan Organ R on: 10/18/2022 08:40 AM   Modules accepted: Orders

## 2022-10-19 LAB — FOLLICLE STIMULATING HORMONE: FSH: 14.9 m[IU]/mL

## 2022-10-19 LAB — LUTEINIZING HORMONE: LH: 11.1 m[IU]/mL

## 2022-10-20 ENCOUNTER — Other Ambulatory Visit: Payer: BC Managed Care – PPO

## 2022-10-20 ENCOUNTER — Ambulatory Visit: Payer: BC Managed Care – PPO | Admitting: Hematology & Oncology

## 2022-10-22 LAB — ESTRADIOL, ULTRA SENS: Estradiol, Sensitive: 14.1 pg/mL

## 2022-11-03 ENCOUNTER — Ambulatory Visit
Admission: RE | Admit: 2022-11-03 | Discharge: 2022-11-03 | Disposition: A | Discharge status: Home / Self Care | Payer: BC Managed Care – PPO | Source: Ambulatory Visit | Attending: Hematology & Oncology | Admitting: Hematology & Oncology

## 2022-11-03 ENCOUNTER — Telehealth: Payer: Self-pay

## 2022-11-03 DIAGNOSIS — C50212 Malignant neoplasm of upper-inner quadrant of left female breast: Secondary | ICD-10-CM

## 2022-11-03 MED ORDER — GADOPICLENOL 0.5 MMOL/ML IV SOLN: 10.0000 mL | Freq: Once | INTRAVENOUS | Status: DC | PRN

## 2022-11-03 MED ORDER — GADOPICLENOL 0.5 MMOL/ML IV SOLN
10.0000 mL | Freq: Once | INTRAVENOUS | Status: AC | PRN
  Administered 2022-11-03: 10 mL via INTRAVENOUS

## 2022-11-03 NOTE — Telephone Encounter (Signed)
-----   Message from Josph Macho, MD sent at 11/03/2022  1:39 PM EDT ----- Please call and let her know that the MRI of the breast looks okay.  There is nothing that is suspicious.  Thanks.  Cindee Lame

## 2022-11-03 NOTE — Telephone Encounter (Signed)
Pt advised via MyChart. 

## 2023-01-24 ENCOUNTER — Ambulatory Visit (HOSPITAL_BASED_OUTPATIENT_CLINIC_OR_DEPARTMENT_OTHER): Payer: BC Managed Care – PPO | Admitting: Obstetrics & Gynecology

## 2023-01-25 ENCOUNTER — Other Ambulatory Visit (HOSPITAL_COMMUNITY)
Admission: RE | Admit: 2023-01-25 | Discharge: 2023-01-25 | Disposition: A | Discharge status: Home / Self Care | Payer: BC Managed Care – PPO | Source: Ambulatory Visit | Attending: Obstetrics & Gynecology | Admitting: Obstetrics & Gynecology

## 2023-01-25 ENCOUNTER — Ambulatory Visit (HOSPITAL_BASED_OUTPATIENT_CLINIC_OR_DEPARTMENT_OTHER): Payer: BC Managed Care – PPO | Admitting: Obstetrics & Gynecology

## 2023-01-25 ENCOUNTER — Encounter (HOSPITAL_BASED_OUTPATIENT_CLINIC_OR_DEPARTMENT_OTHER): Payer: Self-pay | Admitting: Obstetrics & Gynecology

## 2023-01-25 VITALS — BP 124/64 | HR 64 | Ht 65.0 in | Wt 209.0 lb

## 2023-01-25 DIAGNOSIS — D219 Benign neoplasm of connective and other soft tissue, unspecified: Secondary | ICD-10-CM | POA: Diagnosis not present

## 2023-01-25 DIAGNOSIS — Z01419 Encounter for gynecological examination (general) (routine) without abnormal findings: Secondary | ICD-10-CM | POA: Diagnosis not present

## 2023-01-25 DIAGNOSIS — Z124 Encounter for screening for malignant neoplasm of cervix: Secondary | ICD-10-CM

## 2023-01-25 DIAGNOSIS — Z6834 Body mass index (BMI) 34.0-34.9, adult: Secondary | ICD-10-CM

## 2023-01-25 DIAGNOSIS — R635 Abnormal weight gain: Secondary | ICD-10-CM | POA: Diagnosis not present

## 2023-01-25 DIAGNOSIS — A630 Anogenital (venereal) warts: Secondary | ICD-10-CM

## 2023-01-25 NOTE — Patient Instructions (Addendum)
Toni Arthurs, MD Address: [address], [place], [place] [address] Phone: [phone]

## 2023-01-25 NOTE — Progress Notes (Signed)
57 y.o. G6P0030 Married [place] American female here for annual exam.  Denies vaginal bleeding.  Has been more than 2 years since had any bleeding.  Estradiol level with oncology was 14 which was drawn in June.    Frustrated with weight.  Exercising two to three times weekly.    Patient's last menstrual period was 10/06/2020 (approximate).          Sexually active: Yes.    The current method of family planning is post menopausal status.    Exercising: Yes.     Twice to three times weekly Smoker:  no  Health Maintenance: Pap:  01/11/2022 Normal  History of abnormal Pap:  ASCUS with HR HPV 2013, follow up normal since MMG:  04/28/2022, MRI 10/2022 Colonoscopy:  07/02/19 Normal, follow up 10 years BMD:   plan for 2025 Screening Labs: Lipids and HbA1C, TSH obtained today   reports that she has never smoked. She has never used smokeless tobacco. She reports current alcohol use. She reports that she does not use drugs.  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 [facility])     Past Surgical History:  Procedure Laterality Date   BREAST LUMPECTOMY Left 03/20/2019   BREAST LUMPECTOMY WITH RADIOACTIVE SEED LOCALIZATION Left 03/20/2019   Procedure: LEFT BREAST LUMPECTOMY WITH RADIOACTIVE SEED LOCALIZATION;  Surgeon: Abigail Miyamoto, MD;  Location: [facility] OR;  Service: General;  Laterality: Left;   ESOPHAGOGASTRODUODENOSCOPY  2020   IR GENERIC HISTORICAL  03/31/2016   IR RADIOLOGIST EVAL & MGMT 03/31/2016 Jolaine Click, MD [facility] INTERV RAD   IR GENERIC HISTORICAL  01/06/2016   IR RADIOLOGIST EVAL & MGMT 01/06/2016 Jolaine Click, MD [facility] INTERV RAD   left finger reattachment  age 41   LEFT OOPHORECTOMY Left 1998       MYOMECTOMY  2003   MYOMECTOMY  2008   via laparoscopy   RE-EXCISION OF BREAST CANCER,SUPERIOR MARGINS Left 04/06/2019   Procedure: RE-EXCISION OF LEFT BREAST CANCER MARGINS;  Surgeon: Abigail Miyamoto, MD;  Location: [facility] OR;  Service: General;  Laterality: Left;    Current Outpatient Medications  Medication Sig Dispense Refill   amLODipine-valsartan (EXFORGE) 10-320 MG tablet Take 1 tablet by mouth daily.      Cholecalciferol (VITAMIN D3) 125 MCG (5000 UT) CAPS daily.     Dermatological Products, Misc. [facility]) lotion EpiCeram topical emulsion, extended release  APPLY TO LEFT CHEST THROUGHOUT THE DAY     folic acid (FOLVITE) 1 MG tablet TAKE 1 TABLET BY MOUTH EVERY DAY 90 tablet 3   hydrochlorothiazide (HYDRODIURIL) 25 MG tablet Take 25 mg by mouth daily.      pseudoephedrine (SUDAFED) 30 MG tablet Take 1 tablet (30 mg total) by mouth every 8 (eight) hours as needed for congestion. 30 tablet 0   Toremifene Citrate (FARESTON) 60 MG tablet Take 1 tablet (60 mg total) by mouth daily. 90 tablet 3  Turmeric (QC TUMERIC COMPLEX PO) Take by mouth daily.     vitamin C (ASCORBIC ACID) 500 MG tablet Take 500 mg by mouth daily.     Zinc Sulfate (ZINC 15 PO) Take by mouth daily.     No current facility-administered medications for this visit.    Family History  Problem Relation Age of Onset   Diabetes Mother    Hypertension Mother    Heart disease Mother        cardiomegaly   Lupus Mother    Diabetes Father    Hypertension Father    Stroke Father    Hypertension Maternal Grandmother    Stroke Maternal Grandmother    Hypertension Sister    Seizures Sister    COPD Sister    Ovarian cancer Paternal Aunt     ROS: Constitutional: negative Genitourinary:negative  Exam:   BP  124/64 (BP Location: Left Arm, Patient Position: Sitting, Cuff Size: Large)   Pulse 64   Ht 5\' 5"  (1.651 m)   Wt 209 lb (94.8 kg)   LMP 10/06/2020 (Approximate)   BMI 34.78 kg/m   Height: 5\' 5"  (165.1 cm)  General appearance: alert, cooperative and appears stated age Head: Normocephalic, without obvious abnormality, atraumatic Neck: no adenopathy, supple, symmetrical, trachea midline and thyroid normal to inspection and palpation Lungs: clear to auscultation bilaterally Breasts: normal appearance, no masses or tenderness Heart: regular rate and rhythm Abdomen: soft, non-tender; bowel sounds normal; no masses,  no organomegaly Extremities: extremities normal, atraumatic, no cyanosis or edema Skin: Skin color, texture, turgor normal. No rashes or lesions Lymph nodes: Cervical, supraclavicular, and axillary nodes normal. No abnormal inguinal nodes palpated Neurologic: Grossly normal   Pelvic: External genitalia:  no lesions              Urethra:  normal appearing urethra with no masses, tenderness or lesions              Bartholins and Skenes: normal                 Vagina: normal appearing vagina with normal color and no discharge, no lesions              Cervix: no lesions              Pap taken: Yes.   Bimanual Exam:  Uterus:  enlarged, 14 weeks size, larger fibroid palpable anteriorly, this feels enlarged from prior exam              Adnexa: normal adnexa and no mass, fullness, tenderness               Rectovaginal: Confirms               Anus:  normal sphincter tone, no lesions  Chaperone, Hendricks Milo, CMA, was present for exam.  Assessment/Plan: 1. Well woman exam with routine gynecological exam - Pap smear with HR HPV obtained today - Mammogram and MRI up to date - Colonoscopy 2021 - Bone mineral density will be done next year - lab work done - vaccines reviewed/updated  2. Cervical cancer screening - Cytology - PAP( [place])  3. BMI 34.0-34.9,adult -  Hemoglobin A1c - Lipid panel  4. Weight gain - TSH  5. Fibroids - MR PELVIS W WO CONTRAST; Future  6. Condyloma

## 2023-01-26 LAB — TSH: TSH: 1.56 u[IU]/mL (ref 0.450–4.500)

## 2023-01-26 LAB — LIPID PANEL
Chol/HDL Ratio: 2.3 ratio (ref 0.0–4.4)
Cholesterol, Total: 143 mg/dL (ref 100–199)
HDL: 63 mg/dL (ref >39)
LDL Chol Calc (NIH): 68 mg/dL (ref 0–99)
Triglycerides: 57 mg/dL (ref 0–149)
VLDL Cholesterol Cal: 12 mg/dL (ref 5–40)

## 2023-01-26 LAB — HEMOGLOBIN A1C
Est. average glucose Bld gHb Est-mCnc: 108 mg/dL
Hgb A1c MFr Bld: 5.4 % (ref 4.8–5.6)

## 2023-01-27 LAB — CYTOLOGY - PAP
Adequacy: ABSENT
Comment: NEGATIVE
Diagnosis: NEGATIVE
High risk HPV: NEGATIVE

## 2023-03-17 ENCOUNTER — Other Ambulatory Visit: Payer: Self-pay | Admitting: Hematology & Oncology

## 2023-03-17 DIAGNOSIS — Z853 Personal history of malignant neoplasm of breast: Secondary | ICD-10-CM

## 2023-03-31 ENCOUNTER — Other Ambulatory Visit (HOSPITAL_COMMUNITY)
Admission: RE | Admit: 2023-03-31 | Discharge: 2023-03-31 | Disposition: A | Discharge status: Home / Self Care | Payer: BC Managed Care – PPO | Source: Ambulatory Visit | Attending: Obstetrics & Gynecology | Admitting: Obstetrics & Gynecology

## 2023-03-31 ENCOUNTER — Ambulatory Visit (HOSPITAL_BASED_OUTPATIENT_CLINIC_OR_DEPARTMENT_OTHER): Payer: BC Managed Care – PPO

## 2023-03-31 ENCOUNTER — Encounter (HOSPITAL_BASED_OUTPATIENT_CLINIC_OR_DEPARTMENT_OTHER): Payer: Self-pay | Admitting: Obstetrics & Gynecology

## 2023-03-31 VITALS — BP 132/66 | HR 72 | Ht 65.0 in | Wt 213.4 lb

## 2023-03-31 DIAGNOSIS — N898 Other specified noninflammatory disorders of vagina: Secondary | ICD-10-CM | POA: Diagnosis not present

## 2023-03-31 DIAGNOSIS — B3731 Acute candidiasis of vulva and vagina: Secondary | ICD-10-CM | POA: Diagnosis not present

## 2023-03-31 NOTE — Progress Notes (Signed)
Pt presented to office with discharge for her symptom. She stated it started on yesterday and she didn't have any itching or burning with discharge. Pt instructed on how to do self-swab (aptima). Pt was notified once results come back we will contact her.

## 2023-04-04 LAB — CERVICOVAGINAL ANCILLARY ONLY
Bacterial Vaginitis (gardnerella): NEGATIVE
Candida Glabrata: NEGATIVE
Candida Vaginitis: POSITIVE — AB
Comment: NEGATIVE
Comment: NEGATIVE
Comment: NEGATIVE

## 2023-04-04 MED ORDER — FLUCONAZOLE 150 MG PO TABS: ORAL_TABLET | ORAL | 0 refills | Status: DC

## 2023-04-04 NOTE — Addendum Note (Signed)
Addended by: Jerene Bears on: 04/04/2023 05:47 PM   Modules accepted: Orders

## 2023-04-25 ENCOUNTER — Ambulatory Visit: Payer: BC Managed Care – PPO | Admitting: Hematology & Oncology

## 2023-04-25 ENCOUNTER — Inpatient Hospital Stay: Payer: BC Managed Care – PPO

## 2023-04-26 ENCOUNTER — Other Ambulatory Visit (HOSPITAL_BASED_OUTPATIENT_CLINIC_OR_DEPARTMENT_OTHER): Payer: Self-pay

## 2023-04-26 DIAGNOSIS — G478 Other sleep disorders: Secondary | ICD-10-CM

## 2023-05-03 ENCOUNTER — Ambulatory Visit
Admission: RE | Admit: 2023-05-03 | Discharge: 2023-05-03 | Disposition: A | Discharge status: Home / Self Care | Payer: BC Managed Care – PPO | Source: Ambulatory Visit | Attending: Hematology & Oncology | Admitting: Hematology & Oncology

## 2023-05-03 DIAGNOSIS — Z853 Personal history of malignant neoplasm of breast: Secondary | ICD-10-CM

## 2023-05-09 ENCOUNTER — Inpatient Hospital Stay: Payer: BC Managed Care – PPO | Attending: Hematology & Oncology

## 2023-05-09 ENCOUNTER — Other Ambulatory Visit: Payer: Self-pay

## 2023-05-09 ENCOUNTER — Inpatient Hospital Stay (HOSPITAL_BASED_OUTPATIENT_CLINIC_OR_DEPARTMENT_OTHER): Payer: BC Managed Care – PPO | Admitting: Hematology & Oncology

## 2023-05-09 ENCOUNTER — Encounter: Payer: Self-pay | Admitting: Hematology & Oncology

## 2023-05-09 VITALS — BP 146/70 | HR 58 | Temp 98.9°F | Resp 17 | Wt 211.0 lb

## 2023-05-09 DIAGNOSIS — C50212 Malignant neoplasm of upper-inner quadrant of left female breast: Secondary | ICD-10-CM

## 2023-05-09 DIAGNOSIS — D0512 Intraductal carcinoma in situ of left breast: Secondary | ICD-10-CM | POA: Diagnosis not present

## 2023-05-09 DIAGNOSIS — Z923 Personal history of irradiation: Secondary | ICD-10-CM | POA: Insufficient documentation

## 2023-05-09 DIAGNOSIS — Z170 Estrogen receptor positive status [ER+]: Secondary | ICD-10-CM

## 2023-05-09 DIAGNOSIS — N921 Excessive and frequent menstruation with irregular cycle: Secondary | ICD-10-CM | POA: Insufficient documentation

## 2023-05-09 DIAGNOSIS — D500 Iron deficiency anemia secondary to blood loss (chronic): Secondary | ICD-10-CM | POA: Diagnosis present

## 2023-05-09 DIAGNOSIS — Z79811 Long term (current) use of aromatase inhibitors: Secondary | ICD-10-CM | POA: Diagnosis not present

## 2023-05-09 LAB — CMP (CANCER CENTER ONLY)
ALT: 15 U/L (ref 0–44)
AST: 17 U/L (ref 15–41)
Albumin: 3.5 g/dL (ref 3.5–5.0)
Alkaline Phosphatase: 54 U/L (ref 38–126)
Anion gap: 7 (ref 5–15)
BUN: 15 mg/dL (ref 6–20)
CO2: 27 mmol/L (ref 22–32)
Calcium: 8.9 mg/dL (ref 8.9–10.3)
Chloride: 106 mmol/L (ref 98–111)
Creatinine: 0.71 mg/dL (ref 0.44–1.00)
GFR, Estimated: >60 mL/min (ref >60)
Glucose, Bld: 117 mg/dL — ABNORMAL HIGH (ref 70–99)
Potassium: 3.8 mmol/L (ref 3.5–5.1)
Sodium: 140 mmol/L (ref 135–145)
Total Bilirubin: 0.4 mg/dL (ref <1.2)
Total Protein: 7.3 g/dL (ref 6.5–8.1)

## 2023-05-09 LAB — FERRITIN: Ferritin: 25 ng/mL (ref 11–307)

## 2023-05-09 LAB — CBC WITH DIFFERENTIAL (CANCER CENTER ONLY)
Abs Immature Granulocytes: 0.07 10*3/uL (ref 0.00–0.07)
Basophils Absolute: 0 10*3/uL (ref 0.0–0.1)
Basophils Relative: 1 %
Eosinophils Absolute: 0.1 10*3/uL (ref 0.0–0.5)
Eosinophils Relative: 2 %
HCT: 36.6 % (ref 36.0–46.0)
Hemoglobin: 11.7 g/dL — ABNORMAL LOW (ref 12.0–15.0)
Immature Granulocytes: 2 %
Lymphocytes Relative: 42 %
Lymphs Abs: 2 10*3/uL (ref 0.7–4.0)
MCH: 29.3 pg (ref 26.0–34.0)
MCHC: 32 g/dL (ref 30.0–36.0)
MCV: 91.5 fL (ref 80.0–100.0)
Monocytes Absolute: 0.4 10*3/uL (ref 0.1–1.0)
Monocytes Relative: 8 %
Neutro Abs: 2.2 10*3/uL (ref 1.7–7.7)
Neutrophils Relative %: 45 %
Platelet Count: 210 10*3/uL (ref 150–400)
RBC: 4 MIL/uL (ref 3.87–5.11)
RDW: 13.2 % (ref 11.5–15.5)
WBC Count: 4.7 10*3/uL (ref 4.0–10.5)
nRBC: 0 % (ref 0.0–0.2)

## 2023-05-09 LAB — IRON AND IRON BINDING CAPACITY (CC-WL,HP ONLY)
Iron: 76 ug/dL (ref 28–170)
Saturation Ratios: 27 % (ref 10.4–31.8)
TIBC: 279 ug/dL (ref 250–450)
UIBC: 203 ug/dL (ref 148–442)

## 2023-05-09 MED ORDER — ANASTROZOLE 1 MG PO TABS: 1.0000 mg | ORAL_TABLET | Freq: Every day | ORAL | 4 refills | Status: DC

## 2023-05-09 NOTE — Progress Notes (Signed)
Hematology and Oncology Follow Up Visit  [name] [id number] Apr 14, 1966 57 y.o. 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 daily-start on 07/08/2018 --DC on 05/09/2023 Arimidex 1 mg p.o. daily-started on 05/09/2023 Radiation therapy -- completed 5200 rad - on 06/20/2019     Interim History:  [name] is back for follow-up.  We last saw her 6 months ago.  Since then, she is doing pretty well.  She really has had no complaints.  She has had no issues with the Fareston.  She is postmenopausal.  We will now get her on Arimidex.  I would probably get on Arimidex for another couple years.  She has had no problems with bowels or bladder.  She has had no fever.  She has had no issues with COVID.  There is been no rashes.  She has had no leg swelling.  She had a bilateral breast MRI on 11/03/2022.  The breast MRI did not show any suspicious findings.  Overall, I would say that her performance status is probably ECOG 0.    Medications:  Current Outpatient Medications:    amLODipine-valsartan (EXFORGE) 10-320 MG tablet, Take 1 tablet by mouth daily. , Disp: , Rfl:    fluconazole (DIFLUCAN) 150 MG tablet, Take 1 tablet and repeat again in 72 hours, Disp: 2 tablet, Rfl: 0   folic acid (FOLVITE) 1 MG tablet, TAKE 1 TABLET BY MOUTH EVERY DAY, Disp: 90 tablet, Rfl: 3   hydrochlorothiazide (HYDRODIURIL) 25 MG tablet, Take 25 mg by mouth daily. , Disp: , Rfl:    Toremifene Citrate (FARESTON) 60 MG tablet, Take 1 tablet (60 mg total) by mouth daily., Disp: 90 tablet, Rfl: 3   Turmeric (QC TUMERIC COMPLEX PO), Take by mouth daily., Disp: , Rfl:   Allergies:  Allergies  Allergen Reactions   Minocycline Hcl Hives and Itching   Sulfa Antibiotics Swelling    Swell of the lips     Past Medical History,  Surgical history, Social history, and Family History were reviewed and updated.  Review of Systems: Review of Systems  Constitutional: Negative.   HENT:  Negative.    Eyes: Negative.   Respiratory: Negative.    Cardiovascular: Negative.   Gastrointestinal: Negative.   Endocrine: Negative.   Genitourinary: Negative.    Musculoskeletal: Negative.   Skin: Negative.   Neurological: Negative.   Hematological: Negative.   Psychiatric/Behavioral: Negative.      Physical Exam:  weight is 211 lb (95.7 kg). Her oral temperature is 98.9 F (37.2 C). Her blood pressure is 146/70 (abnormal) and her pulse is 58 (abnormal). Her respiration is 17 and oxygen saturation is 100%.   Wt Readings from Last 3 Encounters:  05/09/23 211 lb (95.7 kg)  03/31/23 213 lb 6.4 oz (96.8 kg)  01/25/23 209 lb (94.8 kg)    Physical Exam Vitals reviewed.  Constitutional:      Comments: On her breast exam, right breast was without masses, edema or erythema.  There is no right axillary adenopathy.  Left breast is quite hyperpigmented from radiation.  There is some healing areas of skin from the radiation.  There is some contraction from radiation and  surgery.  She has the lumpectomy scar on the areola at about the 11 o'clock position.  There is no erythema.  There is  no fullness.  There is no distinct mass in the left breast.  There is no left axillary adenopathy.  HENT:     Head: Normocephalic and atraumatic.  Eyes:     Pupils: Pupils are equal, round, and reactive to light.  Cardiovascular:     Rate and Rhythm: Normal rate and regular rhythm.     Heart sounds: Normal heart sounds.  Pulmonary:     Effort: Pulmonary effort is normal.     Breath sounds: Normal breath sounds.  Abdominal:     General: Bowel sounds are normal.     Palpations: Abdomen is soft.  Musculoskeletal:        General: No tenderness or deformity. Normal range of motion.     Cervical back: Normal range of motion.  Lymphadenopathy:      Cervical: No cervical adenopathy.  Skin:    General: Skin is warm and dry.     Findings: No erythema or rash.  Neurological:     Mental Status: She is alert and oriented to person, place, and time.  Psychiatric:        Behavior: Behavior normal.        Thought Content: Thought content normal.        Judgment: Judgment normal.     Lab Results  Component Value Date   WBC 4.7 05/09/2023   HGB 11.7 (L) 05/09/2023   HCT 36.6 05/09/2023   MCV 91.5 05/09/2023   PLT 210 05/09/2023     Chemistry      Component Value Date/Time   NA 140 05/09/2023 0810   NA 142 04/12/2017 0858   K 3.8 05/09/2023 0810   K 3.8 04/12/2017 0858   CL 106 05/09/2023 0810   CL 104 04/12/2017 0858   CO2 27 05/09/2023 0810   CO2 26 04/12/2017 0858   BUN 15 05/09/2023 0810   BUN 9 04/12/2017 0858   CREATININE 0.71 05/09/2023 0810   CREATININE 0.8 04/12/2017 0858      Component Value Date/Time   CALCIUM 8.9 05/09/2023 0810   CALCIUM 9.2 04/12/2017 0858   ALKPHOS 54 05/09/2023 0810   ALKPHOS 74 04/12/2017 0858   AST 17 05/09/2023 0810   ALT 15 05/09/2023 0810   ALT 26 04/12/2017 0858   BILITOT 0.4 05/09/2023 0810      Impression and Plan: [name] is a 57 year old African-American female.  She now has ductal carcinoma in situ of the left breast with microinvasion.    We have her on Fareston.  We will now switch over to an aromatase inhibitor.  I will go ahead and put her on Arimidex.  I think this would be reasonable.  I told her to make sure that she takes vitamin D.  I think that vitamin D 2000 units a day would be reasonable.  Again, we will have her on the Arimidex for another couple years.  I think this would be appropriate for her.  I will let have her come back in 2 months just so we make sure that she is doing okay on the Arimidex.        Josph Macho, MD 12/23/20248:57 AM

## 2023-05-10 LAB — FOLLICLE STIMULATING HORMONE: FSH: 14.3 m[IU]/mL

## 2023-05-10 LAB — LUTEINIZING HORMONE: LH: 11.1 m[IU]/mL

## 2023-05-13 LAB — ESTRADIOL, ULTRA SENS: Estradiol, Sensitive: 20 pg/mL

## 2023-05-20 ENCOUNTER — Ambulatory Visit (HOSPITAL_BASED_OUTPATIENT_CLINIC_OR_DEPARTMENT_OTHER): Payer: BC Managed Care – PPO | Attending: Internal Medicine | Admitting: Internal Medicine

## 2023-05-23 ENCOUNTER — Ambulatory Visit: Payer: BC Managed Care – PPO

## 2023-06-06 ENCOUNTER — Encounter (HOSPITAL_BASED_OUTPATIENT_CLINIC_OR_DEPARTMENT_OTHER): Payer: BC Managed Care – PPO | Admitting: Internal Medicine

## 2023-06-08 ENCOUNTER — Ambulatory Visit (HOSPITAL_BASED_OUTPATIENT_CLINIC_OR_DEPARTMENT_OTHER): Payer: BC Managed Care – PPO | Admitting: Internal Medicine

## 2023-06-08 DIAGNOSIS — G478 Other sleep disorders: Secondary | ICD-10-CM

## 2023-07-08 ENCOUNTER — Other Ambulatory Visit: Payer: Self-pay

## 2023-07-08 DIAGNOSIS — D500 Iron deficiency anemia secondary to blood loss (chronic): Secondary | ICD-10-CM

## 2023-07-08 DIAGNOSIS — Z170 Estrogen receptor positive status [ER+]: Secondary | ICD-10-CM

## 2023-07-11 ENCOUNTER — Inpatient Hospital Stay: Payer: BC Managed Care – PPO | Attending: Hematology & Oncology

## 2023-07-11 ENCOUNTER — Inpatient Hospital Stay (HOSPITAL_BASED_OUTPATIENT_CLINIC_OR_DEPARTMENT_OTHER): Payer: BC Managed Care – PPO | Admitting: Hematology & Oncology

## 2023-07-11 ENCOUNTER — Encounter: Payer: Self-pay | Admitting: *Deleted

## 2023-07-11 ENCOUNTER — Encounter: Payer: Self-pay | Admitting: Hematology & Oncology

## 2023-07-11 VITALS — BP 134/70 | HR 66 | Temp 98.7°F | Resp 20 | Ht 65.0 in | Wt 210.8 lb

## 2023-07-11 DIAGNOSIS — G4730 Sleep apnea, unspecified: Secondary | ICD-10-CM | POA: Diagnosis not present

## 2023-07-11 DIAGNOSIS — Z170 Estrogen receptor positive status [ER+]: Secondary | ICD-10-CM | POA: Diagnosis not present

## 2023-07-11 DIAGNOSIS — N921 Excessive and frequent menstruation with irregular cycle: Secondary | ICD-10-CM | POA: Insufficient documentation

## 2023-07-11 DIAGNOSIS — D0512 Intraductal carcinoma in situ of left breast: Secondary | ICD-10-CM | POA: Insufficient documentation

## 2023-07-11 DIAGNOSIS — Z923 Personal history of irradiation: Secondary | ICD-10-CM | POA: Diagnosis not present

## 2023-07-11 DIAGNOSIS — Z79811 Long term (current) use of aromatase inhibitors: Secondary | ICD-10-CM | POA: Insufficient documentation

## 2023-07-11 DIAGNOSIS — C50212 Malignant neoplasm of upper-inner quadrant of left female breast: Secondary | ICD-10-CM

## 2023-07-11 DIAGNOSIS — D500 Iron deficiency anemia secondary to blood loss (chronic): Secondary | ICD-10-CM | POA: Diagnosis present

## 2023-07-11 LAB — CBC WITH DIFFERENTIAL (CANCER CENTER ONLY)
Abs Immature Granulocytes: 0.05 10*3/uL (ref 0.00–0.07)
Basophils Absolute: 0 10*3/uL (ref 0.0–0.1)
Basophils Relative: 1 %
Eosinophils Absolute: 0.1 10*3/uL (ref 0.0–0.5)
Eosinophils Relative: 2 %
HCT: 38.2 % (ref 36.0–46.0)
Hemoglobin: 12.2 g/dL (ref 12.0–15.0)
Immature Granulocytes: 1 %
Lymphocytes Relative: 41 %
Lymphs Abs: 2.1 10*3/uL (ref 0.7–4.0)
MCH: 29 pg (ref 26.0–34.0)
MCHC: 31.9 g/dL (ref 30.0–36.0)
MCV: 91 fL (ref 80.0–100.0)
Monocytes Absolute: 0.4 10*3/uL (ref 0.1–1.0)
Monocytes Relative: 8 %
Neutro Abs: 2.4 10*3/uL (ref 1.7–7.7)
Neutrophils Relative %: 47 %
Platelet Count: 237 10*3/uL (ref 150–400)
RBC: 4.2 MIL/uL (ref 3.87–5.11)
RDW: 13.2 % (ref 11.5–15.5)
WBC Count: 5.1 10*3/uL (ref 4.0–10.5)
nRBC: 0 % (ref 0.0–0.2)

## 2023-07-11 LAB — CMP (CANCER CENTER ONLY)
ALT: 22 U/L (ref 0–44)
AST: 20 U/L (ref 15–41)
Albumin: 3.9 g/dL (ref 3.5–5.0)
Alkaline Phosphatase: 63 U/L (ref 38–126)
Anion gap: 6 (ref 5–15)
BUN: 17 mg/dL (ref 6–20)
CO2: 30 mmol/L (ref 22–32)
Calcium: 9.8 mg/dL (ref 8.9–10.3)
Chloride: 105 mmol/L (ref 98–111)
Creatinine: 0.72 mg/dL (ref 0.44–1.00)
GFR, Estimated: >60 mL/min (ref >60)
Glucose, Bld: 111 mg/dL — ABNORMAL HIGH (ref 70–99)
Potassium: 3.7 mmol/L (ref 3.5–5.1)
Sodium: 141 mmol/L (ref 135–145)
Total Bilirubin: 0.4 mg/dL (ref 0.0–1.2)
Total Protein: 7.8 g/dL (ref 6.5–8.1)

## 2023-07-11 LAB — IRON AND IRON BINDING CAPACITY (CC-WL,HP ONLY)
Iron: 66 ug/dL (ref 28–170)
Saturation Ratios: 21 % (ref 10.4–31.8)
TIBC: 308 ug/dL (ref 250–450)
UIBC: 242 ug/dL (ref 148–442)

## 2023-07-11 LAB — FERRITIN: Ferritin: 34 ng/mL (ref 11–307)

## 2023-07-11 NOTE — Progress Notes (Signed)
 Hematology and Oncology Follow Up Visit  Chalise Pe [id number] 02-29-1966 58 y.o. 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 daily-start on 07/08/2018 --DC on 05/09/2023 Arimidex 1 mg p.o. daily-started on 05/09/2023 Radiation therapy -- completed 5200 rad - on 06/20/2019     Interim History:  Ms. Jantz is back for follow-up.  Everything is going pretty well for her.  Unfortunately, she was found to have sleep apnea.  She hopefully will do well with CPAP.  We now have her on Arimidex.  She is doing okay on the Arimidex.  She has had no problems from 1.3.  There is been no problems with arthralgias or myalgias.  She has had no bleeding or bruising.  There is been no change in bowel bladder habits.  She has had no cough.  There is been no issues with COVID.  Her last iron studies that were done back in December showed a ferritin 25 iron saturation 27%.  Overall, I would say that her performance status is ECOG 0.    Medications:  Current Outpatient Medications:    amLODipine-valsartan (EXFORGE) 10-320 MG tablet, Take 1 tablet by mouth daily. , Disp: , Rfl:    anastrozole (ARIMIDEX) 1 MG tablet, Take 1 tablet (1 mg total) by mouth daily., Disp: 90 tablet, Rfl: 4   folic acid (FOLVITE) 1 MG tablet, TAKE 1 TABLET BY MOUTH EVERY DAY, Disp: 90 tablet, Rfl: 3   hydrochlorothiazide (HYDRODIURIL) 25 MG tablet, Take 25 mg by mouth daily. , Disp: , Rfl:   Allergies:  Allergies  Allergen Reactions   Minocycline Hcl Hives and Itching   Sulfa Antibiotics Swelling    Swell of the lips     Past Medical History, Surgical history, Social history, and Family History were reviewed and updated.  Review of Systems: Review of Systems  Constitutional: Negative.   HENT:  Negative.    Eyes: Negative.    Respiratory: Negative.    Cardiovascular: Negative.   Gastrointestinal: Negative.   Endocrine: Negative.   Genitourinary: Negative.    Musculoskeletal: Negative.   Skin: Negative.   Neurological: Negative.   Hematological: Negative.   Psychiatric/Behavioral: Negative.      Physical Exam:  height is 5\' 5"  (1.651 m) and weight is 210 lb 12.8 oz (95.6 kg). Her oral temperature is 98.7 F (37.1 C). Her blood pressure is 134/70 and her pulse is 66. Her respiration is 20 and oxygen saturation is 100%.   Wt Readings from Last 3 Encounters:  07/11/23 210 lb 12.8 oz (95.6 kg)  06/08/23 210 lb (95.3 kg)  05/09/23 211 lb (95.7 kg)    Physical Exam Vitals reviewed.  Constitutional:      Comments: On her breast exam, right breast was without masses, edema or erythema.  There is no right axillary adenopathy.  Left breast is quite hyperpigmented from radiation.  There is some healing areas of skin from the radiation.  There is some contraction from radiation and  surgery.  She has the lumpectomy scar on the areola at about the 11 o'clock position.  There is no erythema.  There is no fullness.  There is no distinct mass in the left breast.  There is no left axillary adenopathy.  HENT:     Head: Normocephalic and atraumatic.  Eyes:  Pupils: Pupils are equal, round, and reactive to light.  Cardiovascular:     Rate and Rhythm: Normal rate and regular rhythm.     Heart sounds: Normal heart sounds.  Pulmonary:     Effort: Pulmonary effort is normal.     Breath sounds: Normal breath sounds.  Abdominal:     General: Bowel sounds are normal.     Palpations: Abdomen is soft.  Musculoskeletal:        General: No tenderness or deformity. Normal range of motion.     Cervical back: Normal range of motion.  Lymphadenopathy:     Cervical: No cervical adenopathy.  Skin:    General: Skin is warm and dry.     Findings: No erythema or rash.  Neurological:     Mental Status: She is alert and  oriented to person, place, and time.  Psychiatric:        Behavior: Behavior normal.        Thought Content: Thought content normal.        Judgment: Judgment normal.      Lab Results  Component Value Date   WBC 5.1 07/11/2023   HGB 12.2 07/11/2023   HCT 38.2 07/11/2023   MCV 91.0 07/11/2023   PLT 237 07/11/2023     Chemistry      Component Value Date/Time   NA 140 05/09/2023 0810   NA 142 04/12/2017 0858   K 3.8 05/09/2023 0810   K 3.8 04/12/2017 0858   CL 106 05/09/2023 0810   CL 104 04/12/2017 0858   CO2 27 05/09/2023 0810   CO2 26 04/12/2017 0858   BUN 15 05/09/2023 0810   BUN 9 04/12/2017 0858   CREATININE 0.71 05/09/2023 0810   CREATININE 0.8 04/12/2017 0858      Component Value Date/Time   CALCIUM 8.9 05/09/2023 0810   CALCIUM 9.2 04/12/2017 0858   ALKPHOS 54 05/09/2023 0810   ALKPHOS 74 04/12/2017 0858   AST 17 05/09/2023 0810   ALT 15 05/09/2023 0810   ALT 26 04/12/2017 0858   BILITOT 0.4 05/09/2023 0810      Impression and Plan: Ms. Salone is a 58 year old African-American female.  She now has ductal carcinoma in situ of the left breast with microinvasion.    She is doing well on the Arimidex.  We will keep her on the Arimidex now.  I will have her on Arimidex for probably 2 years.  Again, I told her that the CPAP is going be incredibly important for her sleep apnea.  The CPAP, this will clearly help decrease her risk of complications from the sleep apnea.  I think we now get her back in 6 months.  I feel confident that she will do okay on the Arimidex without a complications.      Josph Macho, MD 2/24/20258:29 AM

## 2023-07-12 ENCOUNTER — Ambulatory Visit (HOSPITAL_BASED_OUTPATIENT_CLINIC_OR_DEPARTMENT_OTHER): Payer: BC Managed Care – PPO | Attending: Internal Medicine | Admitting: Internal Medicine

## 2023-07-12 LAB — FOLLICLE STIMULATING HORMONE: FSH: 23.1 m[IU]/mL

## 2023-07-12 LAB — LUTEINIZING HORMONE: LH: 15.3 m[IU]/mL

## 2023-07-14 LAB — ESTRADIOL, ULTRA SENS: Estradiol, Sensitive: <2.5 pg/mL

## 2023-07-28 ENCOUNTER — Other Ambulatory Visit: Payer: Self-pay

## 2023-07-28 ENCOUNTER — Emergency Department (HOSPITAL_BASED_OUTPATIENT_CLINIC_OR_DEPARTMENT_OTHER)
Admission: EM | Admit: 2023-07-28 | Discharge: 2023-07-28 | Discharge status: Against Medical Advice | Attending: Emergency Medicine | Admitting: Emergency Medicine

## 2023-07-28 ENCOUNTER — Emergency Department (HOSPITAL_BASED_OUTPATIENT_CLINIC_OR_DEPARTMENT_OTHER)

## 2023-07-28 DIAGNOSIS — R0602 Shortness of breath: Secondary | ICD-10-CM | POA: Diagnosis present

## 2023-07-28 DIAGNOSIS — Z5321 Procedure and treatment not carried out due to patient leaving prior to being seen by health care provider: Secondary | ICD-10-CM | POA: Insufficient documentation

## 2023-07-28 LAB — CBG MONITORING, ED: Glucose-Capillary: 71 mg/dL (ref 70–99)

## 2023-07-28 MED ORDER — ALBUTEROL SULFATE HFA 108 (90 BASE) MCG/ACT IN AERS: 2.0000 | INHALATION_SPRAY | RESPIRATORY_TRACT | Status: DC | PRN

## 2023-07-28 NOTE — ED Triage Notes (Signed)
 Pt POV in wheelchair- reports going to workout class, when she got home had worsening ShOB, feeling like she was going to pass out.

## 2023-12-20 NOTE — Procedures (Signed)
 Cancelled.

## 2024-01-09 ENCOUNTER — Other Ambulatory Visit: Payer: Self-pay

## 2024-01-09 ENCOUNTER — Inpatient Hospital Stay: Payer: BC Managed Care – PPO | Attending: Hematology & Oncology

## 2024-01-09 ENCOUNTER — Inpatient Hospital Stay (HOSPITAL_BASED_OUTPATIENT_CLINIC_OR_DEPARTMENT_OTHER): Payer: BC Managed Care – PPO | Admitting: Hematology & Oncology

## 2024-01-09 ENCOUNTER — Encounter: Payer: Self-pay | Admitting: Hematology & Oncology

## 2024-01-09 ENCOUNTER — Ambulatory Visit: Payer: Self-pay | Admitting: Hematology & Oncology

## 2024-01-09 ENCOUNTER — Encounter: Payer: Self-pay | Admitting: *Deleted

## 2024-01-09 VITALS — BP 118/94 | HR 66 | Temp 98.1°F | Resp 18 | Wt 213.0 lb

## 2024-01-09 DIAGNOSIS — R7989 Other specified abnormal findings of blood chemistry: Secondary | ICD-10-CM

## 2024-01-09 DIAGNOSIS — Z923 Personal history of irradiation: Secondary | ICD-10-CM | POA: Diagnosis not present

## 2024-01-09 DIAGNOSIS — D0512 Intraductal carcinoma in situ of left breast: Secondary | ICD-10-CM | POA: Diagnosis not present

## 2024-01-09 DIAGNOSIS — Z79811 Long term (current) use of aromatase inhibitors: Secondary | ICD-10-CM | POA: Diagnosis not present

## 2024-01-09 DIAGNOSIS — Z170 Estrogen receptor positive status [ER+]: Secondary | ICD-10-CM | POA: Diagnosis not present

## 2024-01-09 DIAGNOSIS — D500 Iron deficiency anemia secondary to blood loss (chronic): Secondary | ICD-10-CM | POA: Insufficient documentation

## 2024-01-09 DIAGNOSIS — N921 Excessive and frequent menstruation with irregular cycle: Secondary | ICD-10-CM | POA: Insufficient documentation

## 2024-01-09 LAB — CMP (CANCER CENTER ONLY)
ALT: 50 U/L — ABNORMAL HIGH (ref 0–44)
AST: 40 U/L (ref 15–41)
Albumin: 4.2 g/dL (ref 3.5–5.0)
Alkaline Phosphatase: 88 U/L (ref 38–126)
Anion gap: 11 (ref 5–15)
BUN: 15 mg/dL (ref 6–20)
CO2: 26 mmol/L (ref 22–32)
Calcium: 9.7 mg/dL (ref 8.9–10.3)
Chloride: 105 mmol/L (ref 98–111)
Creatinine: 0.72 mg/dL (ref 0.44–1.00)
GFR, Estimated: >60 mL/min (ref >60)
Glucose, Bld: 106 mg/dL — ABNORMAL HIGH (ref 70–99)
Potassium: 4.1 mmol/L (ref 3.5–5.1)
Sodium: 142 mmol/L (ref 135–145)
Total Bilirubin: 0.4 mg/dL (ref 0.0–1.2)
Total Protein: 8.2 g/dL — ABNORMAL HIGH (ref 6.5–8.1)

## 2024-01-09 LAB — CBC WITH DIFFERENTIAL (CANCER CENTER ONLY)
Abs Immature Granulocytes: 0.11 K/uL — ABNORMAL HIGH (ref 0.00–0.07)
Basophils Absolute: 0 K/uL (ref 0.0–0.1)
Basophils Relative: 1 %
Eosinophils Absolute: 0.1 K/uL (ref 0.0–0.5)
Eosinophils Relative: 2 %
HCT: 35.4 % — ABNORMAL LOW (ref 36.0–46.0)
Hemoglobin: 11.1 g/dL — ABNORMAL LOW (ref 12.0–15.0)
Immature Granulocytes: 2 %
Lymphocytes Relative: 36 %
Lymphs Abs: 2.2 K/uL (ref 0.7–4.0)
MCH: 28.1 pg (ref 26.0–34.0)
MCHC: 31.4 g/dL (ref 30.0–36.0)
MCV: 89.6 fL (ref 80.0–100.0)
Monocytes Absolute: 0.5 K/uL (ref 0.1–1.0)
Monocytes Relative: 7 %
Neutro Abs: 3.2 K/uL (ref 1.7–7.7)
Neutrophils Relative %: 52 %
Platelet Count: 246 K/uL (ref 150–400)
RBC: 3.95 MIL/uL (ref 3.87–5.11)
RDW: 14.4 % (ref 11.5–15.5)
WBC Count: 6.1 K/uL (ref 4.0–10.5)
nRBC: 0 % (ref 0.0–0.2)

## 2024-01-09 LAB — IRON AND IRON BINDING CAPACITY (CC-WL,HP ONLY)
Iron: 57 ug/dL (ref 28–170)
Saturation Ratios: 19 % (ref 10.4–31.8)
TIBC: 307 ug/dL (ref 250–450)
UIBC: 250 ug/dL

## 2024-01-09 LAB — LACTATE DEHYDROGENASE: LDH: 240 U/L — ABNORMAL HIGH (ref 98–192)

## 2024-01-09 LAB — FERRITIN: Ferritin: 44 ng/mL (ref 11–307)

## 2024-01-09 NOTE — Progress Notes (Signed)
 Hematology and Oncology Follow Up Visit  [name] [id number] [birth year] 58 y.o. 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 daily-start on 07/08/2018 --DC on 05/09/2023 Arimidex  1 mg p.o. daily-started on 05/09/2023 Radiation therapy -- completed 5200 rad - on 06/20/2019     Interim History:  [name] is back for follow-up.  She has been quite busy.  Both she and her husband have been traveling.  She was in [place], in [place] since we last saw her.  She was go out to [facility] for work.  She and her husband will be going down to [place] I think over the holidays.  Otherwise, she is doing okay.  She is menopausal.  She is having some sleep issues.  She is not able to lose weight.  She is exercising [place] bit more.  She is doing okay on the Arimidex .  She has been on the Arimidex  now for almost 5 years.  I think 7 years would be appropriate for her.  Her iron studies that we did back in February showed a ferritin of 34 with iron saturation of 21%.  She has had no change in bowel or bladder habits.  She has had no cough.  Thankfully, she has had no problems with COVID.  Overall, I would say that her performance status is ECOG 1.   Medications:  Current Outpatient Medications:    amLODipine-valsartan (EXFORGE) 10-320 MG tablet, Take 1 tablet by mouth daily. , Disp: , Rfl:    anastrozole  (ARIMIDEX ) 1 MG tablet, Take 1 tablet (1 mg total) by mouth daily., Disp: 90 tablet, Rfl: 4   folic acid  (FOLVITE ) 1 MG tablet, TAKE 1 TABLET BY MOUTH EVERY DAY, Disp: 90 tablet, Rfl: 3   hydrochlorothiazide  (HYDRODIURIL ) 25 MG tablet, Take 25 mg by mouth daily. , Disp: , Rfl:   Allergies:  Allergies  Allergen Reactions   Minocycline Hcl Hives and Itching   Sulfa Antibiotics Swelling    Swell of  the lips     Past Medical History, Surgical history, Social history, and Family History were reviewed and updated.  Review of Systems: Review of Systems  Constitutional: Negative.   HENT:  Negative.    Eyes: Negative.   Respiratory: Negative.    Cardiovascular: Negative.   Gastrointestinal: Negative.   Endocrine: Negative.   Genitourinary: Negative.    Musculoskeletal: Negative.   Skin: Negative.   Neurological: Negative.   Hematological: Negative.   Psychiatric/Behavioral: Negative.      Physical Exam:  Vital signs show temperature of 98.1.  Pulse 66.  Blood pressure 118/94.  Weight is 213 pounds  Wt Readings from Last 3 Encounters:  07/28/23 206 lb (93.4 kg)  07/11/23 210 lb 12.8 oz (95.6 kg)  06/08/23 210 lb (95.3 kg)    Physical Exam Vitals reviewed.  Constitutional:      Comments: On her breast exam, right breast was without masses, edema or erythema.  There is no right axillary adenopathy.  Left breast is quite hyperpigmented from radiation.  There is some healing areas of skin from the radiation.  There is some contraction from radiation and  surgery.  She has the lumpectomy scar on the areola at about the 11 o'clock position.  There is no erythema.  There is no fullness.  There is  no distinct mass in the left breast.  There is no left axillary adenopathy.  HENT:     Head: Normocephalic and atraumatic.  Eyes:     Pupils: Pupils are equal, round, and reactive to light.  Cardiovascular:     Rate and Rhythm: Normal rate and regular rhythm.     Heart sounds: Normal heart sounds.  Pulmonary:     Effort: Pulmonary effort is normal.     Breath sounds: Normal breath sounds.  Abdominal:     General: Bowel sounds are normal.     Palpations: Abdomen is soft.  Musculoskeletal:        General: No tenderness or deformity. Normal range of motion.     Cervical back: Normal range of motion.  Lymphadenopathy:     Cervical: No cervical adenopathy.  Skin:    General: Skin is  warm and dry.     Findings: No erythema or rash.  Neurological:     Mental Status: She is alert and oriented to person, place, and time.  Psychiatric:        Behavior: Behavior normal.        Thought Content: Thought content normal.        Judgment: Judgment normal.      Lab Results  Component Value Date   WBC 5.1 07/11/2023   HGB 12.2 07/11/2023   HCT 38.2 07/11/2023   MCV 91.0 07/11/2023   PLT 237 07/11/2023     Chemistry      Component Value Date/Time   NA 141 07/11/2023 0750   NA 142 04/12/2017 0858   K 3.7 07/11/2023 0750   K 3.8 04/12/2017 0858   CL 105 07/11/2023 0750   CL 104 04/12/2017 0858   CO2 30 07/11/2023 0750   CO2 26 04/12/2017 0858   BUN 17 07/11/2023 0750   BUN 9 04/12/2017 0858   CREATININE 0.72 07/11/2023 0750   CREATININE 0.8 04/12/2017 0858      Component Value Date/Time   CALCIUM 9.8 07/11/2023 0750   CALCIUM 9.2 04/12/2017 0858   ALKPHOS 63 07/11/2023 0750   ALKPHOS 74 04/12/2017 0858   AST 20 07/11/2023 0750   ALT 22 07/11/2023 0750   ALT 26 04/12/2017 0858   BILITOT 0.4 07/11/2023 0750      Impression and Plan: [name] is a 58 year old African-American female.  She now has ductal carcinoma in situ of the left breast with microinvasion.    She is doing well on the Arimidex .  We will keep her on the Arimidex  now.  I will have her on Arimidex  for probably another 2 years.  I am not sure why she has the isolated mildly elevated SGPT.  She is worried about this.  We will go ahead and get a sonogram of her liver just to make sure everything is okay.  I still would like to get her back in 6 months.     Maude JONELLE Crease, MD 8/25/20258:07 AM

## 2024-01-15 ENCOUNTER — Ambulatory Visit (HOSPITAL_BASED_OUTPATIENT_CLINIC_OR_DEPARTMENT_OTHER)
Admission: RE | Admit: 2024-01-15 | Discharge: 2024-01-15 | Disposition: A | Discharge status: Home / Self Care | Source: Ambulatory Visit | Attending: Hematology & Oncology | Admitting: Hematology & Oncology

## 2024-01-15 DIAGNOSIS — R7989 Other specified abnormal findings of blood chemistry: Secondary | ICD-10-CM | POA: Diagnosis present

## 2024-01-19 ENCOUNTER — Ambulatory Visit: Payer: Self-pay | Admitting: Hematology & Oncology

## 2024-03-05 ENCOUNTER — Ambulatory Visit (INDEPENDENT_AMBULATORY_CARE_PROVIDER_SITE_OTHER): Admitting: Certified Nurse Midwife

## 2024-03-05 ENCOUNTER — Other Ambulatory Visit (HOSPITAL_COMMUNITY)
Admission: RE | Admit: 2024-03-05 | Discharge: 2024-03-05 | Disposition: A | Discharge status: Home / Self Care | Source: Ambulatory Visit | Attending: Certified Nurse Midwife | Admitting: Certified Nurse Midwife

## 2024-03-05 ENCOUNTER — Encounter (HOSPITAL_BASED_OUTPATIENT_CLINIC_OR_DEPARTMENT_OTHER): Payer: Self-pay | Admitting: Certified Nurse Midwife

## 2024-03-05 ENCOUNTER — Ambulatory Visit (HOSPITAL_BASED_OUTPATIENT_CLINIC_OR_DEPARTMENT_OTHER): Admitting: Obstetrics & Gynecology

## 2024-03-05 VITALS — BP 125/76 | HR 60 | Wt 213.8 lb

## 2024-03-05 DIAGNOSIS — Z124 Encounter for screening for malignant neoplasm of cervix: Secondary | ICD-10-CM

## 2024-03-05 DIAGNOSIS — Z90721 Acquired absence of ovaries, unilateral: Secondary | ICD-10-CM

## 2024-03-05 DIAGNOSIS — Z853 Personal history of malignant neoplasm of breast: Secondary | ICD-10-CM

## 2024-03-05 DIAGNOSIS — Z01419 Encounter for gynecological examination (general) (routine) without abnormal findings: Secondary | ICD-10-CM

## 2024-03-05 DIAGNOSIS — Z780 Asymptomatic menopausal state: Secondary | ICD-10-CM

## 2024-03-05 DIAGNOSIS — N852 Hypertrophy of uterus: Secondary | ICD-10-CM

## 2024-03-05 NOTE — Progress Notes (Signed)
 58 y.o. [id number] Married [place] American female here for annual exam. Hx unilateral oophorectomy. Menopause approx 3 years ago with no vaginal spotting or bleeding since that time. She is sexually active. Will have occasional urinary stress incontinence when coughing but only if bladder is full.  Pt does report pedal and pre-tibial edema. Has compression hose.   Patient's last menstrual period was 10/06/2020 (approximate).          Sexually active: Yes.    The current method of family planning is menopause  Exercising: Yes. Patient reports that she does strength and conditioning Smoker:  no  Health Maintenance: Pap:  01/29/2023 History of abnormal Pap:  yes MMG:  05/03/2023 (Hx Breast Cancer) Colonoscopy:  07/02/2019, Normal per pt, repeat in 2031 BMD:   Discussed BMD in future (menopause 3 years ago) Shingles Vaccine: Has not had but will receive today  Flu Vaccine: Declines Screening Labs: PCP   reports that she has never smoked. She has never used smokeless tobacco. She reports current alcohol use. She reports that she does not use drugs.Alcohol use infrequent, social.   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 [facility])     Past Surgical History:  Procedure Laterality Date   BREAST LUMPECTOMY Left 03/20/2019   BREAST LUMPECTOMY WITH RADIOACTIVE SEED LOCALIZATION Left 03/20/2019   Procedure: LEFT BREAST LUMPECTOMY WITH RADIOACTIVE SEED LOCALIZATION;  Surgeon: Vernetta Berg, MD;  Location:  [facility] OR;  Service: General;  Laterality: Left;   ESOPHAGOGASTRODUODENOSCOPY  2020   IR GENERIC HISTORICAL  03/31/2016   IR RADIOLOGIST EVAL & MGMT 03/31/2016 Rome Hall, MD [facility] INTERV RAD   IR GENERIC HISTORICAL  01/06/2016   IR RADIOLOGIST EVAL & MGMT 01/06/2016 Rome Hall, MD [facility] INTERV RAD   left finger reattachment  age 19   LEFT OOPHORECTOMY Left 1998       MYOMECTOMY  2003   MYOMECTOMY  2008   via laparoscopy   RE-EXCISION OF BREAST CANCER,SUPERIOR MARGINS Left 04/06/2019   Procedure: RE-EXCISION OF LEFT BREAST CANCER MARGINS;  Surgeon: Vernetta Berg, MD;  Location: [facility] OR;  Service: General;  Laterality: Left;    Current Outpatient Medications  Medication Sig Dispense Refill   amLODipine-valsartan (EXFORGE) 10-320 MG tablet Take 1 tablet by mouth daily.      anastrozole  (ARIMIDEX ) 1 MG tablet Take 1 tablet (1 mg total) by mouth daily. 90 tablet 4   folic acid  (FOLVITE ) 1 MG tablet TAKE 1 TABLET BY MOUTH EVERY DAY 90 tablet 3   hydrochlorothiazide  (HYDRODIURIL ) 25 MG tablet Take 25 mg by mouth daily.      No current facility-administered medications for this visit.    Family History  Problem Relation Age of Onset   Diabetes Mother    Hypertension Mother    Heart disease Mother        cardiomegaly   Lupus Mother    Diabetes Father  Hypertension Father    Stroke Father    Hypertension Maternal Grandmother    Stroke Maternal Grandmother    Hypertension Sister    Seizures Sister    COPD Sister    Ovarian cancer Paternal Aunt     ROS: Constitutional: negative for chills, fevers, and night sweats Genitourinary:negative  Exam:   LMP 10/06/2020 (Approximate)      General appearance: alert, cooperative and appears stated age Head: Normocephalic, without obvious abnormality, atraumatic Breasts: normal appearance, no masses or tenderness, Inspection negative, No nipple retraction or dimpling, No nipple discharge or bleeding, No axillary or supraclavicular  adenopathy, Normal to palpation without dominant masses, small scar left nipple from lumpectomy Abdomen: soft, non-tender; bowel sounds normal; +fibroids palpable? Extremities: extremities normal, atraumatic, no cyanosis or edema Skin: Skin color, texture, turgor normal. No rashes or lesions Lymph nodes: Cervical, supraclavicular, and axillary nodes normal. No abnormal inguinal nodes palpated Neurologic: Grossly normal  Pelvic: External genitalia:  no lesions              Urethra:  normal appearing urethra with no masses, tenderness or lesions              Bartholins and Skenes: normal                 Vagina: normal appearing vagina with normal color and no discharge, no lesions              Cervix: no bleeding following Pap, no cervical motion tenderness, no lesions, and nulliparous appearance              Pap taken: Yes.   Bimanual Exam:  Uterus:  enlarged, 12+ weeks size and soft              Adnexa: no mass, fullness, tenderness               Rectovaginal: Confirms               Anus:  normal sphincter tone, no lesions  Chaperone,  CMA, was present for exam.  Assessment/Plan:  1. Encounter for annual routine gynecological examination - Pt prefers annual screening pap smears (collected) - She is followed by Oncology due to hx Breast Cancer (Mammogram/MRI/breast exams) - Pt praised for healthy diet and routine weight bearing exercise - Adequate calcium and Vitamin D  intake encouraged - Shingles Vaccine encouraged - Pt UTD on Colonoscopy - Consider Dermatology visit for baseline preventative skin exams  2. Cervical cancer screening (Primary) - Pt has no need for STD Screening - Cytology - PAP( [facility])  3. Enlarged uterus - Hx Fibroids - US  PELVIC COMPLETE WITH TRANSVAGINAL; Future  4. HX: breast cancer, Estrogen + - followed routinely   5. Postmenopausal - Discussed osteoporosis prevention with weight bearing exercise and adequate Calcium/Vitamin D   Pt will RTO for  Pelvic US . RTO in 1 year for annual gyn exam. Arland MARLA Roller

## 2024-03-06 LAB — CYTOLOGY - PAP
Comment: NEGATIVE
Diagnosis: NEGATIVE
High risk HPV: NEGATIVE

## 2024-03-07 ENCOUNTER — Ambulatory Visit (HOSPITAL_BASED_OUTPATIENT_CLINIC_OR_DEPARTMENT_OTHER): Payer: Self-pay | Admitting: Certified Nurse Midwife

## 2024-03-15 ENCOUNTER — Other Ambulatory Visit: Payer: Self-pay | Admitting: Hematology & Oncology

## 2024-03-15 DIAGNOSIS — Z9889 Other specified postprocedural states: Secondary | ICD-10-CM

## 2024-03-15 DIAGNOSIS — R928 Other abnormal and inconclusive findings on diagnostic imaging of breast: Secondary | ICD-10-CM

## 2024-03-21 ENCOUNTER — Encounter (HOSPITAL_BASED_OUTPATIENT_CLINIC_OR_DEPARTMENT_OTHER): Payer: Self-pay | Admitting: Obstetrics & Gynecology

## 2024-03-21 ENCOUNTER — Ambulatory Visit (HOSPITAL_BASED_OUTPATIENT_CLINIC_OR_DEPARTMENT_OTHER): Admitting: Obstetrics & Gynecology

## 2024-03-21 ENCOUNTER — Ambulatory Visit (HOSPITAL_BASED_OUTPATIENT_CLINIC_OR_DEPARTMENT_OTHER)

## 2024-03-21 VITALS — BP 127/73 | HR 67 | Wt 216.0 lb

## 2024-03-21 DIAGNOSIS — D251 Intramural leiomyoma of uterus: Secondary | ICD-10-CM

## 2024-03-21 DIAGNOSIS — N852 Hypertrophy of uterus: Secondary | ICD-10-CM

## 2024-03-21 DIAGNOSIS — D252 Subserosal leiomyoma of uterus: Secondary | ICD-10-CM

## 2024-03-21 DIAGNOSIS — Z780 Asymptomatic menopausal state: Secondary | ICD-10-CM | POA: Diagnosis not present

## 2024-03-21 NOTE — Progress Notes (Signed)
   Ultrasound f/u Patient name: [name] MRN [id number]  Date of birth: 1965/09/29 Chief Complaint:   Follow-up  History of Present Illness:   [name] is a 58 y.o. G50P0030 African-American female being seen today for discussion of ultrasound findings.  H/o fibroids.  Ultrasound ordered for recheck by Arland Roller, CNM.  Uterus ~13 x 8 x 9cm with multiple calcified fibroids.  Largest 5.5cm.  Volume .  Ovaries difficult to see.  No abnormalities noted.    With comparison to ultrasound in 2021, uterus is smaller.  Largest fibroid was 6cm so lartest noted today is smaller.  Overall measurements of uterus are smaller as well.    No recent vaginal bleeding.  Patient's last menstrual period was 10/06/2020 (approximate).  Last pap 03/05/2024. Results were: NILM w/ HRHPV negative.   Review of Systems:   Pertinent items are noted in HPI Pertinent History Reviewed:  Reviewed past medical,surgical, social and family history.  Reviewed problem list, medications and allergies. Physical Assessment:   Vitals:   03/21/24 1351  BP: 127/73  Pulse: 67  SpO2: 100%  Weight: 216 lb (98 kg)  Body mass index is 35.94 kg/m.        Physical Examination:   General appearance - well appearing, and in no distress  Mental status - alert, oriented to person, place, and time  Psych:  She has a normal mood and affect   Assessment & Plan:  1. Enlarged uterus (Primary) - uterus is small and largest fibroid is smaller.  Pt reassured .   2. Intramural and subserous leiomyoma of uterus  3. Postmenopausal   Ronal GORMAN Pinal, MD 04/01/2024 1:36 PM GYNECOLOGY  VISIT

## 2024-04-19 ENCOUNTER — Encounter (HOSPITAL_BASED_OUTPATIENT_CLINIC_OR_DEPARTMENT_OTHER): Payer: Self-pay | Admitting: Obstetrics & Gynecology

## 2024-04-19 ENCOUNTER — Other Ambulatory Visit (HOSPITAL_COMMUNITY)
Admission: RE | Admit: 2024-04-19 | Discharge: 2024-04-19 | Disposition: A | Source: Ambulatory Visit | Attending: Obstetrics & Gynecology | Admitting: Obstetrics & Gynecology

## 2024-04-19 ENCOUNTER — Ambulatory Visit (HOSPITAL_BASED_OUTPATIENT_CLINIC_OR_DEPARTMENT_OTHER): Admitting: Obstetrics & Gynecology

## 2024-04-19 VITALS — BP 135/89 | HR 99 | Ht 65.0 in | Wt 216.4 lb

## 2024-04-19 DIAGNOSIS — N950 Postmenopausal bleeding: Secondary | ICD-10-CM | POA: Diagnosis present

## 2024-04-19 DIAGNOSIS — D251 Intramural leiomyoma of uterus: Secondary | ICD-10-CM

## 2024-04-19 DIAGNOSIS — N852 Hypertrophy of uterus: Secondary | ICD-10-CM

## 2024-04-19 NOTE — Progress Notes (Signed)
   GYNECOLOGY  VISIT  CC:   PMP bleeding   HPI: 58 y.o. G63P0030 Married Black or African American female here for post-menopausal bleeding and endometrial biopsy. Patient reports that the bleeding has been the same pattern as a period. Bleeding started on Friday, was really light at the start and then got very heavy Saturday and Sunday and has lightened up since then.  She is very anxious about this.  Had recent ultrasound on 03/21/2024 with endometrium measuring right at 4mm without any abnormal findings.  Discussed possible causes.  Recommend endometrial biopsy today.  Pt comfortable with plan.  Patient's last menstrual period was 10/06/2020 (approximate).  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  Genitourinary:        PMP bleeding    PHYSICAL EXAMINATION:    BP 135/89 (BP Location: Right Arm, Patient Position: Sitting, Cuff Size: Large)   Pulse 99   Ht 5' 5 (1.651 m)   Wt 216 lb 6.4 oz (98.2 kg)   LMP 10/06/2020 (Approximate)   SpO2 100%   BMI 36.01 kg/m     General appearance: alert, cooperative and appears stated age Abdomen: soft, non-tender; bowel sounds normal; no masses,  no organomegaly Lymph:  no inguinal LAD noted  Pelvic: External genitalia:  no lesions              Urethra:  normal appearing urethra with no  masses, tenderness or lesions              Bartholins and Skenes: normal                 Vagina: normal mucosa without prolapse or lesions              Cervix: no lesions and minimal yellowish discharge from os noted  Endometrial biopsy recommended.  Discussed with patient.  Verbal and written consent obtained.   Procedure:  Speculum placed.  Cervix visualized and cleansed with betadine prep.  A single toothed tenaculum was applied to the anterior lip of the cervix.  Endometrial pipelle was advanced through the cervix into the endometrial cavity without difficulty.  Pipelle passed to 8.5cm.  Suction applied and pipelle removed with good tissue sample obtained.  Tenculum removed.  No bleeding noted.  Patient tolerated procedure well.  Chaperone was present for exam.  Assessment/Plan: 1. Postmenopausal bleeding (Primary) - Surgical pathology( [facility]/ [facility]) - results will be called to pt and additional recommendations made at that time  2. Enlarged uterus  3. Intramural and subserous leiomyoma of uterus

## 2024-04-23 ENCOUNTER — Ambulatory Visit (HOSPITAL_BASED_OUTPATIENT_CLINIC_OR_DEPARTMENT_OTHER): Payer: Self-pay | Admitting: Obstetrics & Gynecology

## 2024-04-23 LAB — SURGICAL PATHOLOGY

## 2024-05-03 ENCOUNTER — Inpatient Hospital Stay
Admission: RE | Admit: 2024-05-03 | Discharge: 2024-05-03 | Attending: Hematology & Oncology | Admitting: Hematology & Oncology

## 2024-05-03 DIAGNOSIS — Z9889 Other specified postprocedural states: Secondary | ICD-10-CM

## 2024-05-10 ENCOUNTER — Other Ambulatory Visit: Payer: Self-pay | Admitting: Hematology & Oncology

## 2024-05-21 ENCOUNTER — Ambulatory Visit
Admission: RE | Admit: 2024-05-21 | Discharge: 2024-05-21 | Disposition: A | Discharge status: Home / Self Care | Source: Ambulatory Visit | Attending: Family Medicine | Admitting: Family Medicine

## 2024-05-21 VITALS — BP 150/83 | HR 76 | Temp 98.5°F | Resp 16 | Ht 65.0 in | Wt 210.0 lb

## 2024-05-21 DIAGNOSIS — J069 Acute upper respiratory infection, unspecified: Secondary | ICD-10-CM | POA: Diagnosis not present

## 2024-05-21 MED ORDER — BENZONATATE 200 MG PO CAPS: 200.0000 mg | ORAL_CAPSULE | Freq: Three times a day (TID) | ORAL | 0 refills | Status: AC | PRN

## 2024-05-21 NOTE — ED Triage Notes (Signed)
 Pt st's she had a sore throat approx 1 week ago and then it subsided but she started having a productive cough.  Has taken OTC meds without relief

## 2024-05-21 NOTE — Discharge Instructions (Addendum)
 Please treat your symptoms with over the counter tylenol , humidifier, and rest. Viral illnesses can last 7-12 days.  You may take Tessalon  3 times a day as needed for your cough.  Please follow up with your PCP if your symptoms are not improving. Please go to the ER for any worsening symptoms. This includes but is not limited to fever you can not control with tylenol  or ibuprofen , you are not able to stay hydrated, you have shortness of breath or chest pain.  Thank you for choosing [facility] for your healthcare needs. I hope you feel better soon!

## 2024-05-21 NOTE — ED Provider Notes (Signed)
 " [facility]    CSN: [id number] Arrival date & time: 05/21/24  9146      History   Chief Complaint Chief Complaint  Patient presents with   Cough    Congestion and cough that has lasted for 3 days. - Entered by patient    HPI [name] is a 59 y.o. female  presents for evaluation of URI symptoms for 5 days. Patient reports associated symptoms of cough, congestion.  Sore throat for 2 days but that is since resolved.  Denies N/V/D, fevers, ear pain, body aches, shortness of breath. Patient does not have a hx of asthma. Patient is not an active smoker.   Reports no known sick contacts.  Pt has taken Mucinex OTC for symptoms. Pt has no other concerns at this time.    Cough   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 [facility])     Patient Active Problem List   Diagnosis Date Noted   Postmenopausal 03/05/2024   HX: breast cancer 03/05/2024   Enlarged uterus 03/05/2024   Protrusion of cervical intervertebral disc 11/07/2019   Gastroesophageal reflux disease 08/06/2019   Genetic testing 03/16/2019   Family history of cancer    Malignant neoplasm of upper-inner quadrant of left breast in female, estrogen receptor positive (HCC) 02/21/2019   Uterine leiomyoma 09/29/2015   Fibroids 10/28/2014    Class: History of   Female infertility 03/19/2014   Numbness of face 01/04/2014   Other malaise and fatigue 01/04/2014   Disturbance of skin sensation 01/04/2014   Hypertensive disorder  01/30/2009   HYPERSOMNIA 01/30/2009   History of cardiovascular disorder 01/30/2009    Past Surgical History:  Procedure Laterality Date   BREAST LUMPECTOMY Left 03/20/2019   BREAST LUMPECTOMY WITH RADIOACTIVE SEED LOCALIZATION Left 03/20/2019   Procedure: LEFT BREAST LUMPECTOMY WITH RADIOACTIVE SEED LOCALIZATION;  Surgeon: Vernetta Berg, MD;  Location: [facility] OR;  Service: General;  Laterality: Left;   ESOPHAGOGASTRODUODENOSCOPY  2020   IR GENERIC HISTORICAL  03/31/2016   IR RADIOLOGIST EVAL & MGMT 03/31/2016 Rome Hall, MD [facility] INTERV RAD   IR GENERIC HISTORICAL  01/06/2016   IR RADIOLOGIST EVAL & MGMT 01/06/2016 Rome Hall, MD [facility] INTERV RAD   left finger reattachment  age 41   LEFT OOPHORECTOMY Left 1998       MYOMECTOMY  2003   MYOMECTOMY  2008   via laparoscopy   RE-EXCISION OF BREAST CANCER,SUPERIOR MARGINS Left 04/06/2019   Procedure: RE-EXCISION OF LEFT BREAST CANCER MARGINS;  Surgeon: Vernetta Berg, MD;  Location: [facility] OR;  Service: General;  Laterality: Left;    OB History     Gravida  3   Para  0   Term      Preterm      AB  3   Living  0      SAB      IAB  Ectopic      Multiple      Live Births               Home Medications    Prior to Admission medications  Medication Sig Start Date End Date Taking? Authorizing Provider  benzonatate  (TESSALON ) 200 MG capsule Take 1 capsule (200 mg total) by mouth 3 (three) times daily as needed. 05/21/24  Yes Tatanisha Cuthbert, Jodi R, NP  amLODipine-valsartan (EXFORGE) 10-320 MG tablet Take 1 tablet by mouth daily.  05/20/16   [provider]  anastrozole  (ARIMIDEX ) 1 MG tablet TAKE 1 TABLET BY MOUTH EVERY DAY 05/10/24   Timmy Maude SAUNDERS, MD  folic acid  (FOLVITE ) 1 MG tablet TAKE 1 TABLET BY MOUTH EVERY DAY Patient not taking: Reported on 03/05/2024 08/11/22   Timmy Maude SAUNDERS, MD  hydrochlorothiazide  (HYDRODIURIL ) 25 MG tablet Take 25 mg by mouth daily.  06/18/16   [provider]     Family History Family History  Problem Relation Age of Onset   Diabetes Mother    Hypertension Mother    Heart disease Mother        cardiomegaly   Lupus Mother    Diabetes Father    Hypertension Father    Stroke Father    Hypertension Maternal Grandmother    Stroke Maternal Grandmother    Hypertension Sister    Seizures Sister    COPD Sister    Ovarian cancer Paternal Aunt     Social History Social History[1]   Allergies   Minocycline hcl and Sulfa antibiotics   Review of Systems Review of Systems  HENT:  Positive for congestion.   Respiratory:  Positive for cough.      Physical Exam Triage Vital Signs ED Triage Vitals  Encounter Vitals Group     BP 05/21/24 0910 (!) 150/83     Girls Systolic BP Percentile --      Girls Diastolic BP Percentile --      Boys Systolic BP Percentile --      Boys Diastolic BP Percentile --      Pulse Rate 05/21/24 0910 76     Resp 05/21/24 0910 16     Temp 05/21/24 0910 98.5 F (36.9 C)     Temp Source 05/21/24 0910 Oral     SpO2 05/21/24 0910 96 %     Weight 05/21/24 0911 210 lb (95.3 kg)     Height 05/21/24 0911 5' 5 (1.651 m)     Head Circumference --      Peak Flow --      Pain Score 05/21/24 0911 0     Pain Loc --      Pain Education --      Exclude from Growth Chart --    No data found.  Updated Vital Signs BP (!) 150/83 (BP Location: Left Arm)   Pulse 76   Temp 98.5 F (36.9 C) (Oral)   Resp 16   Ht 5' 5 (1.651 m)   Wt 210 lb (95.3 kg)   LMP 10/06/2020   SpO2 96%   BMI 34.95 kg/m   Visual Acuity Right Eye Distance:   Left Eye Distance:   Bilateral Distance:    Right Eye Near:   Left Eye Near:    Bilateral Near:     Physical Exam Vitals and nursing note reviewed.  Constitutional:      General: She is not in acute distress.    Appearance: She is well-developed. She is not ill-appearing.  HENT:     Head: Normocephalic and atraumatic.     Right Ear: Tympanic membrane and ear canal normal.      Left Ear: Tympanic membrane and ear canal normal.     Nose: Congestion present.     Mouth/Throat:     Mouth: Mucous membranes are moist.     Pharynx: Oropharynx is clear. Uvula midline. No oropharyngeal exudate or posterior oropharyngeal erythema.     Tonsils: No tonsillar exudate or tonsillar abscesses.  Eyes:     Conjunctiva/sclera: Conjunctivae normal.     Pupils: Pupils are equal, round, and reactive to light.  Cardiovascular:     Rate and Rhythm: Normal rate and regular rhythm.     Heart sounds: Normal heart sounds.  Pulmonary:     Effort: Pulmonary effort is normal.     Breath sounds: Normal breath sounds. No wheezing, rhonchi or rales.  Musculoskeletal:     Cervical back: Normal range of motion and neck supple.  Lymphadenopathy:     Cervical: No cervical adenopathy.  Skin:    General: Skin is warm and dry.  Neurological:     General: No focal deficit present.     Mental Status: She is alert and oriented to person, place, and time.  Psychiatric:        Mood and Affect: Mood normal.        Behavior: Behavior normal.      UC Treatments / Results  Labs (all labs ordered are listed, but only abnormal results are displayed) Labs Reviewed - No data to display  EKG   Radiology No results found.  Procedures Procedures (including critical care time)  Medications Ordered in UC Medications - No data to display  Initial Impression / Assessment and Plan / UC Course  I have reviewed the triage vital signs and the nursing notes.  Pertinent labs & imaging results that were available during my care of the patient were reviewed by me and considered in my medical decision making (see chart for details).     Discussed viral illness and symptomatic treatment.  Tessalon  as needed for cough.  Advise rest fluids and PCP follow-up if symptoms do not improve.  ER precautions reviewed. Final Clinical Impressions(s) / UC Diagnoses   Final diagnoses:  Viral upper respiratory  illness     Discharge Instructions      Please treat your symptoms with over the counter tylenol , humidifier, and rest. Viral illnesses can last 7-12 days.  You may take Tessalon  3 times a day as needed for your cough.  Please follow up with your PCP if your symptoms are not improving. Please go to the ER for any worsening symptoms. This includes but is not limited to fever you can not control with tylenol  or ibuprofen , you are not able to stay hydrated, you have shortness of breath or chest pain.  Thank you for choosing [facility] for your healthcare needs. I hope you feel better soon!     ED Prescriptions     Medication Sig Dispense Auth. Provider   benzonatate  (TESSALON ) 200 MG capsule Take 1 capsule (200 mg total) by mouth 3 (three) times daily as needed. 20 capsule Erving Sassano, Jodi R, NP      PDMP not reviewed this encounter.    [1]  Social History Tobacco Use   Smoking status: Never   Smokeless tobacco: Never  Vaping Use   Vaping status: Never Used  Substance Use Topics   Alcohol use: Yes  Alcohol/week: 0.0 - 1.0 standard drinks of alcohol    Comment: occasional wine   Drug use: No     Loreda Myla SAUNDERS, NP 05/21/24 [phone]  "

## 2024-05-30 ENCOUNTER — Telehealth: Admitting: Family Medicine

## 2024-05-30 DIAGNOSIS — J069 Acute upper respiratory infection, unspecified: Secondary | ICD-10-CM | POA: Diagnosis not present

## 2024-05-30 MED ORDER — PREDNISONE 20 MG PO TABS: 20.0000 mg | ORAL_TABLET | Freq: Two times a day (BID) | ORAL | 0 refills | Status: AC

## 2024-05-30 MED ORDER — PROMETHAZINE-DM 6.25-15 MG/5ML PO SYRP: 5.0000 mL | ORAL_SOLUTION | Freq: Four times a day (QID) | ORAL | 0 refills | Status: AC | PRN

## 2024-05-30 MED ORDER — AZITHROMYCIN 250 MG PO TABS: ORAL_TABLET | ORAL | 0 refills | Status: AC

## 2024-05-30 NOTE — Progress Notes (Signed)
 " Virtual Visit Consent   [name], you are scheduled for a virtual visit with a [facility] provider today. Just as with appointments in the office, your consent must be obtained to participate. Your consent will be active for this visit and any virtual visit you may have with one of our providers in the next 365 days. If you have a MyChart account, a copy of this consent can be sent to you electronically.  As this is a virtual visit, video technology does not allow for your provider to perform a traditional examination. This may limit your provider's ability to fully assess your condition. If your provider identifies any concerns that need to be evaluated in person or the need to arrange testing (such as labs, EKG, etc.), we will make arrangements to do so. Although advances in technology are sophisticated, we cannot ensure that it will always work on either your end or our end. If the connection with a video visit is poor, the visit may have to be switched to a telephone visit. With either a video or telephone visit, we are not always able to ensure that we have a secure connection.  By engaging in this virtual visit, you consent to the provision of healthcare and authorize for your insurance to be billed (if applicable) for the services provided during this visit. Depending on your insurance coverage, you may receive a charge related to this service.  I need to obtain your verbal consent now. Are you willing to proceed with your visit today? [name] has provided verbal consent on 05/30/2024 for a virtual visit (video or telephone). Loa Lamp, FNP  Date: 05/30/2024 5:40 PM   Virtual Visit via Video Note   I, Loa Lamp, connected with  [name]  ([id number], 07-30-1965) on 05/30/2024 at  5:30 PM EST by a video-enabled telemedicine application and verified that I am speaking with the correct person using two identifiers.  Location: Patient: Virtual Visit  Location Patient: Home Provider: Virtual Visit Location Provider: Home Office   I discussed the limitations of evaluation and management by telemedicine and the availability of in person appointments. The patient expressed understanding and agreed to proceed.    History of Present Illness: [name] is a 59 y.o. who identifies as a female who was assigned female at birth, and is being seen today for cough for over 2 weeks, sore throat, improved then worsened, yellow mucus, dx viral at urgent care. Pt concerns it is now bacterial. Sinus congestion.   HPI: HPI  Problems:  Patient Active Problem List   Diagnosis Date Noted   Postmenopausal 03/05/2024   HX: breast cancer 03/05/2024   Enlarged uterus 03/05/2024   Protrusion of cervical intervertebral disc 11/07/2019   Gastroesophageal reflux disease 08/06/2019   Genetic testing 03/16/2019   Family history of cancer    Malignant neoplasm of upper-inner quadrant of left breast in female, estrogen receptor positive (HCC) 02/21/2019   Uterine leiomyoma 09/29/2015   Fibroids 10/28/2014    Class: History of   Female infertility 03/19/2014   Numbness of face 01/04/2014   Other malaise and fatigue 01/04/2014   Disturbance of skin sensation 01/04/2014   Hypertensive disorder 01/30/2009   HYPERSOMNIA 01/30/2009   History of cardiovascular disorder 01/30/2009    Allergies: Allergies[1] Medications: Current Medications[2]  Observations/Objective: Patient is well-developed, well-nourished in no acute distress.  Resting comfortably  at home.  Head is normocephalic, atraumatic.  No labored breathing.  Speech is clear  and coherent with logical content.  Patient is alert and oriented at baseline.    Assessment and Plan: 1. Upper respiratory tract infection, unspecified type (Primary)  Increase fluids, humidifier at night, tylenol  or ibuprofen  as directed, UC as needed.   Follow Up Instructions: I discussed the assessment and  treatment plan with the patient. The patient was provided an opportunity to ask questions and all were answered. The patient agreed with the plan and demonstrated an understanding of the instructions.  A copy of instructions were sent to the patient via MyChart unless otherwise noted below.     The patient was advised to call back or seek an in-person evaluation if the symptoms worsen or if the condition fails to improve as anticipated.    Kseniya Grunden, FNP     [1]  Allergies Allergen Reactions   Minocycline Hcl Hives and Itching   Sulfa Antibiotics Swelling and Other (See Comments)    Swell of the lips  [2]  Current Outpatient Medications:    azithromycin  (ZITHROMAX ) 250 MG tablet, Take 2 tablets on day 1, then 1 tablet daily on days 2 through 5, Disp: 6 tablet, Rfl: 0   predniSONE  (DELTASONE ) 20 MG tablet, Take 1 tablet (20 mg total) by mouth 2 (two) times daily with a meal for 5 days., Disp: 10 tablet, Rfl: 0   promethazine -dextromethorphan (PROMETHAZINE -DM) 6.25-15 MG/5ML syrup, Take 5 mLs by mouth 4 (four) times daily as needed for up to 10 days for cough., Disp: 118 mL, Rfl: 0   amLODipine-valsartan (EXFORGE) 10-320 MG tablet, Take 1 tablet by mouth daily. , Disp: , Rfl:    anastrozole  (ARIMIDEX ) 1 MG tablet, TAKE 1 TABLET BY MOUTH EVERY DAY, Disp: 90 tablet, Rfl: 4   benzonatate  (TESSALON ) 200 MG capsule, Take 1 capsule (200 mg total) by mouth 3 (three) times daily as needed., Disp: 20 capsule, Rfl: 0   folic acid  (FOLVITE ) 1 MG tablet, TAKE 1 TABLET BY MOUTH EVERY DAY (Patient not taking: Reported on 03/05/2024), Disp: 90 tablet, Rfl: 3   hydrochlorothiazide  (HYDRODIURIL ) 25 MG tablet, Take 25 mg by mouth daily. , Disp: , Rfl:   "

## 2024-05-30 NOTE — Patient Instructions (Signed)

## 2024-07-09 ENCOUNTER — Inpatient Hospital Stay

## 2024-07-09 ENCOUNTER — Ambulatory Visit: Admitting: Hematology & Oncology
